# Patient Record
Sex: Female | Born: 1935
Health system: Southern US, Community
[De-identification: ages and names within clinical notes are randomized; demographics above are authoritative.]

## PROBLEM LIST (undated history)

## (undated) ENCOUNTER — Emergency Department (HOSPITAL_COMMUNITY): Payer: Medicare Other

## (undated) DIAGNOSIS — S43006A Unspecified dislocation of unspecified shoulder joint, initial encounter: Secondary | ICD-10-CM

## (undated) DIAGNOSIS — R269 Unspecified abnormalities of gait and mobility: Secondary | ICD-10-CM

## (undated) DIAGNOSIS — E785 Hyperlipidemia, unspecified: Secondary | ICD-10-CM

## (undated) DIAGNOSIS — G629 Polyneuropathy, unspecified: Secondary | ICD-10-CM

## (undated) DIAGNOSIS — F32A Depression, unspecified: Secondary | ICD-10-CM

## (undated) DIAGNOSIS — F419 Anxiety disorder, unspecified: Secondary | ICD-10-CM

## (undated) DIAGNOSIS — F329 Major depressive disorder, single episode, unspecified: Secondary | ICD-10-CM

## (undated) HISTORY — DX: Hyperlipidemia, unspecified: E78.5

## (undated) HISTORY — DX: Unspecified abnormalities of gait and mobility: R26.9

## (undated) HISTORY — DX: Unspecified dislocation of unspecified shoulder joint, initial encounter: S43.006A

## (undated) HISTORY — DX: Anxiety disorder, unspecified: F41.9

## (undated) HISTORY — DX: Polyneuropathy, unspecified: G62.9

## (undated) HISTORY — DX: Major depressive disorder, single episode, unspecified: F32.9

## (undated) HISTORY — DX: Depression, unspecified: F32.A

## (undated) HISTORY — PX: NO PAST SURGERIES: SHX2092

---

## 1998-02-19 ENCOUNTER — Inpatient Hospital Stay (HOSPITAL_COMMUNITY): Admission: EM | Admit: 1998-02-19 | Discharge: 1998-02-20 | Payer: Self-pay | Admitting: Internal Medicine

## 1998-03-05 ENCOUNTER — Encounter: Payer: Self-pay | Admitting: *Deleted

## 1998-03-05 ENCOUNTER — Ambulatory Visit (HOSPITAL_COMMUNITY): Admission: RE | Admit: 1998-03-05 | Discharge: 1998-03-05 | Payer: Self-pay | Admitting: *Deleted

## 1999-03-13 ENCOUNTER — Ambulatory Visit (HOSPITAL_COMMUNITY): Admission: RE | Admit: 1999-03-13 | Discharge: 1999-03-13 | Payer: Self-pay | Admitting: *Deleted

## 1999-03-13 ENCOUNTER — Encounter: Payer: Self-pay | Admitting: *Deleted

## 1999-03-18 ENCOUNTER — Ambulatory Visit (HOSPITAL_COMMUNITY): Admission: RE | Admit: 1999-03-18 | Discharge: 1999-03-18 | Payer: Self-pay | Admitting: *Deleted

## 1999-03-18 ENCOUNTER — Encounter: Payer: Self-pay | Admitting: *Deleted

## 2000-03-19 ENCOUNTER — Ambulatory Visit (HOSPITAL_COMMUNITY): Admission: RE | Admit: 2000-03-19 | Discharge: 2000-03-19 | Payer: Self-pay | Admitting: *Deleted

## 2000-03-19 ENCOUNTER — Encounter: Payer: Self-pay | Admitting: *Deleted

## 2001-03-22 ENCOUNTER — Encounter: Payer: Self-pay | Admitting: *Deleted

## 2001-03-22 ENCOUNTER — Ambulatory Visit (HOSPITAL_COMMUNITY): Admission: RE | Admit: 2001-03-22 | Discharge: 2001-03-22 | Payer: Self-pay | Admitting: *Deleted

## 2002-03-23 ENCOUNTER — Encounter: Payer: Self-pay | Admitting: *Deleted

## 2002-03-23 ENCOUNTER — Ambulatory Visit (HOSPITAL_COMMUNITY): Admission: RE | Admit: 2002-03-23 | Discharge: 2002-03-23 | Payer: Self-pay | Admitting: *Deleted

## 2002-10-02 ENCOUNTER — Ambulatory Visit (HOSPITAL_COMMUNITY): Admission: RE | Admit: 2002-10-02 | Discharge: 2002-10-02 | Payer: Self-pay | Admitting: Gastroenterology

## 2002-10-02 ENCOUNTER — Encounter (INDEPENDENT_AMBULATORY_CARE_PROVIDER_SITE_OTHER): Payer: Self-pay | Admitting: Specialist

## 2003-03-30 ENCOUNTER — Other Ambulatory Visit: Admission: RE | Admit: 2003-03-30 | Discharge: 2003-03-30 | Payer: Self-pay | Admitting: *Deleted

## 2003-04-04 ENCOUNTER — Ambulatory Visit (HOSPITAL_COMMUNITY): Admission: RE | Admit: 2003-04-04 | Discharge: 2003-04-04 | Payer: Self-pay | Admitting: *Deleted

## 2004-05-07 ENCOUNTER — Ambulatory Visit (HOSPITAL_COMMUNITY): Admission: RE | Admit: 2004-05-07 | Discharge: 2004-05-07 | Payer: Self-pay | Admitting: *Deleted

## 2005-05-08 ENCOUNTER — Ambulatory Visit (HOSPITAL_COMMUNITY): Admission: RE | Admit: 2005-05-08 | Discharge: 2005-05-08 | Payer: Self-pay | Admitting: Family Medicine

## 2006-06-16 ENCOUNTER — Ambulatory Visit (HOSPITAL_COMMUNITY): Admission: RE | Admit: 2006-06-16 | Discharge: 2006-06-16 | Payer: Self-pay | Admitting: Family Medicine

## 2007-05-27 ENCOUNTER — Encounter: Admission: RE | Admit: 2007-05-27 | Discharge: 2007-06-14 | Payer: Self-pay | Admitting: Family Medicine

## 2007-08-01 ENCOUNTER — Ambulatory Visit (HOSPITAL_COMMUNITY): Admission: RE | Admit: 2007-08-01 | Discharge: 2007-08-01 | Payer: Self-pay | Admitting: Family Medicine

## 2007-08-14 ENCOUNTER — Emergency Department (HOSPITAL_COMMUNITY): Admission: EM | Admit: 2007-08-14 | Discharge: 2007-08-14 | Payer: Self-pay | Admitting: Emergency Medicine

## 2008-08-10 ENCOUNTER — Ambulatory Visit (HOSPITAL_COMMUNITY): Admission: RE | Admit: 2008-08-10 | Discharge: 2008-08-10 | Payer: Self-pay | Admitting: Family Medicine

## 2008-08-27 ENCOUNTER — Emergency Department (HOSPITAL_BASED_OUTPATIENT_CLINIC_OR_DEPARTMENT_OTHER): Admission: EM | Admit: 2008-08-27 | Discharge: 2008-08-27 | Payer: Self-pay | Admitting: Emergency Medicine

## 2008-08-27 ENCOUNTER — Ambulatory Visit: Payer: Self-pay | Admitting: Diagnostic Radiology

## 2009-09-07 ENCOUNTER — Ambulatory Visit: Payer: Self-pay | Admitting: Diagnostic Radiology

## 2009-09-07 ENCOUNTER — Emergency Department (HOSPITAL_BASED_OUTPATIENT_CLINIC_OR_DEPARTMENT_OTHER): Admission: EM | Admit: 2009-09-07 | Discharge: 2009-09-07 | Payer: Self-pay | Admitting: Emergency Medicine

## 2009-10-11 ENCOUNTER — Ambulatory Visit (HOSPITAL_COMMUNITY): Admission: RE | Admit: 2009-10-11 | Discharge: 2009-10-11 | Payer: Self-pay | Admitting: Family Medicine

## 2010-02-25 ENCOUNTER — Encounter
Admission: RE | Admit: 2010-02-25 | Discharge: 2010-02-25 | Payer: Self-pay | Source: Home / Self Care | Attending: Family Medicine | Admitting: Family Medicine

## 2010-04-03 ENCOUNTER — Other Ambulatory Visit: Payer: Self-pay | Admitting: Gastroenterology

## 2010-04-03 LAB — HM COLONOSCOPY

## 2010-07-11 NOTE — Op Note (Signed)
NAME:  Andrea Martin, Andrea Martin                          ACCOUNT NO.:  [id number]   MEDICAL RECORD NO.:  [id number]                   PATIENT TYPE:  AMB   LOCATION:  ENDO                                 FACILITY:  [facility]   PHYSICIAN:  Petra Kuba, M.D.                 DATE OF BIRTH:  24-Jul-1935   DATE OF PROCEDURE:  10/02/2002  DATE OF DISCHARGE:                                 OPERATIVE REPORT   PROCEDURE:  Colonoscopy.   INDICATIONS FOR PROCEDURE:  The patient due for colonic screening. Consent  was signed after the risks and benefits, methods and options were thoroughly  discussed in the office.   MEDICATIONS USED:  Demerol 75, Versed 7.   DESCRIPTION OF PROCEDURE:  A rectal inspection was pertinent for small  external hemorrhoids. A digital examination was negative. The video  pediatric colonoscope was inserted and with some difficulty due to a  tortuous, long, looping colon, was able to be advanced  to the cecum  __________ transverse. A small polyp was seen and was cold biopsied on  insertion. No other  abnormalities were seen. The cecum was identified by  the appendiceal orifice and the ileocecal valve.   The scope was slowly withdrawn. The prep was adequate. There was some liquid  stool that required washing and suctioning. It was slowly pulled through the  colon and in the more proximal transverse, a small polyp was seen and was  hot biopsied x1. Unfortunately on withdrawing back to the left side of the  colon we did not find the cold biopsy site of the polyp  that we had seen on  insertion.   We went ahead and readvanced to the cecum, which was very difficult, but  again on insertion and withdrawal one more time we could not find a cold  biopsy site. The scope was further  withdrawn. In the proximal level of the  splenic flexure another small polyp was seen and was hot biopsied x1. All  polyps were put in the same container.   The scope was slowly withdrawn through the left  side of the colon. No  additional findings were seen. Anorectal pullthrough and retroflexion  confirmed some small hemorrhoids. The scope was reinserted a short way up  the left side of the colon. Air was suctioned. The scope was removed.   The patient tolerated the procedure well. There were no obvious immediate  complications.   ENDOSCOPIC DIAGNOSES:  1. Internal and external hemorrhoids.  2. Tortuous, long, looping colon.  3. Three transverse polyps, all small, 2 hot biopsies on withdrawal, 1 cold     biopsy on insertion. Questionable one which was cold biopsied completely     removed.  4. Otherwise within normal limits to the cecum.    PLAN:  Await pathology to determine future screening, but may want to  proceed a little sooner based on the one  polyp probably not completely  removed. Otherwise return to the care of Dr. Janey Greaser for the customary  health care management including yearly rectals and Guaiacs. I will be happy  to see back sooner as needed.                                               Petra Kuba, M.D.    MEM/MEDQ  D:  10/02/2002  T:  10/02/2002  Job:  [id number]   cc:   Al Decant. Janey Greaser, MD  [address]  [place]  [place] [address]  Fax: [phone]

## 2010-09-08 ENCOUNTER — Other Ambulatory Visit (HOSPITAL_COMMUNITY): Payer: Self-pay | Admitting: Family Medicine

## 2010-09-08 DIAGNOSIS — Z1231 Encounter for screening mammogram for malignant neoplasm of breast: Secondary | ICD-10-CM

## 2010-10-13 ENCOUNTER — Ambulatory Visit (HOSPITAL_COMMUNITY)
Admission: RE | Admit: 2010-10-13 | Discharge: 2010-10-13 | Disposition: A | Discharge status: Home / Self Care | Payer: Medicare Other | Source: Ambulatory Visit | Attending: Family Medicine | Admitting: Family Medicine

## 2010-10-13 DIAGNOSIS — Z1231 Encounter for screening mammogram for malignant neoplasm of breast: Secondary | ICD-10-CM | POA: Insufficient documentation

## 2011-10-28 ENCOUNTER — Ambulatory Visit: Payer: Medicare Other | Admitting: Internal Medicine

## 2011-11-03 ENCOUNTER — Ambulatory Visit (INDEPENDENT_AMBULATORY_CARE_PROVIDER_SITE_OTHER): Payer: Medicare Other | Admitting: Internal Medicine

## 2011-11-03 ENCOUNTER — Encounter: Payer: Self-pay | Admitting: Internal Medicine

## 2011-11-03 VITALS — BP 138/86 | HR 76 | Temp 97.3°F | Ht 65.25 in | Wt 168.0 lb

## 2011-11-03 DIAGNOSIS — F329 Major depressive disorder, single episode, unspecified: Secondary | ICD-10-CM | POA: Insufficient documentation

## 2011-11-03 DIAGNOSIS — R269 Unspecified abnormalities of gait and mobility: Secondary | ICD-10-CM | POA: Insufficient documentation

## 2011-11-03 DIAGNOSIS — E785 Hyperlipidemia, unspecified: Secondary | ICD-10-CM

## 2011-11-03 DIAGNOSIS — F341 Dysthymic disorder: Secondary | ICD-10-CM

## 2011-11-03 DIAGNOSIS — F32A Depression, unspecified: Secondary | ICD-10-CM

## 2011-11-03 MED ORDER — BUPROPION HCL ER (SR) 150 MG PO TB12: 150.0000 mg | ORAL_TABLET | Freq: Every day | ORAL | Status: DC

## 2011-11-03 MED ORDER — ESCITALOPRAM OXALATE 20 MG PO TABS: 20.0000 mg | ORAL_TABLET | Freq: Every day | ORAL | Status: DC

## 2011-11-03 MED ORDER — SIMVASTATIN 40 MG PO TABS: 40.0000 mg | ORAL_TABLET | Freq: Every evening | ORAL | Status: DC

## 2011-11-03 NOTE — Progress Notes (Signed)
  Subjective:    Patient ID: Andrea Martin, female    DOB: May 21, 1935, 76 y.o.   MRN: [id number]  HPI Here to get established, new patient. Previous PCP Dr. Sigmund Hazel High cholesterol, good medication compliance Anxiety depression, symptoms are currently well controlled with Lexapro Wellbutrin, on looking back, the patient realizes that she has more anxiety than depression. She would like to cut down on her medication and to stop them if possible. She denies any major problems that could trigger  anxiety, she states that she simply worry too much. Had several falls few years ago, no recent falls.  Past medical history Hyperlipidemia History of colon polyps Anxiety, depression History of right shoulder dislocation after a fall  Past surgical history No major   Social history Married x 51 years,  Children x 1, 1 2 G children tobacco--  Never  ETOH-- no  Family history Diabetes-- no CAD-- F, Bx2, brother dx in his 92s Stroke--no Colon cancer--no Breast cancer--no   Review of Systems No chest pain or shortness of breath No nausea, vomiting, diarrhea. Good medication compliance.     Objective:   Physical Exam General -- alert, well-developed Lungs -- normal respiratory effort, no intercostal retractions, no accessory muscle use, and normal breath sounds.   Heart-- normal rate, regular rhythm, no murmur, and no gallop.   Extremities-- no pretibial edema bilaterally  Neurologic-- alert & oriented X3 and strength normal in all extremities. Psych-- Cognition and judgment appear intact. Alert and cooperative with normal attention span and concentration.  not anxious appearing and not depressed appearing.       Assessment & Plan:   Extensive records are reviewed: Last tetanus shot 04-2011 Pneumonia shot 2006 Zostavax 2008 Last pelvic exam by previous PCP 02-2010, I see no report of a Pap smear Last breast exam by previous PCP 04-2011 Last mammogram 09-2010, negative. Was  recommended to have one mammogram every 2 years, I agree History of colonoscopies in 2004, 2007 and 2012. Nexium 2017, GI Dr. Ewing Schlein

## 2011-11-03 NOTE — Assessment & Plan Note (Signed)
Occasional falls, was worse 3 years ago in the last year she has been more steady. Previous PCP work her up with a CT of the head which was reportedly normal. We talk about possibly physical therapy, going to the [facility] or [facility] to get some strength back on her legs. Fall prevention also discussed

## 2011-11-03 NOTE — Assessment & Plan Note (Signed)
Good medication compliance, last cholesterol panel was on 04/2011. Total cholesterol 179, LDL 109. Plan: No change, refill meds

## 2011-11-03 NOTE — Assessment & Plan Note (Addendum)
History of anxiety > depression, well-controlled with current medications. Patient would like to "cut down" her medicines. Plan: Continue Lexapro 20 mg daily Decreased Wellbutrin XR 150 from twice a day to once daily Reassess on RTC

## 2012-02-01 ENCOUNTER — Ambulatory Visit (INDEPENDENT_AMBULATORY_CARE_PROVIDER_SITE_OTHER): Payer: Medicare Other | Admitting: Internal Medicine

## 2012-02-01 ENCOUNTER — Encounter: Payer: Self-pay | Admitting: Internal Medicine

## 2012-02-01 VITALS — BP 140/84 | HR 58 | Temp 98.6°F | Wt 171.0 lb

## 2012-02-01 DIAGNOSIS — F341 Dysthymic disorder: Secondary | ICD-10-CM

## 2012-02-01 DIAGNOSIS — F32A Depression, unspecified: Secondary | ICD-10-CM

## 2012-02-01 DIAGNOSIS — F329 Major depressive disorder, single episode, unspecified: Secondary | ICD-10-CM

## 2012-02-01 DIAGNOSIS — E785 Hyperlipidemia, unspecified: Secondary | ICD-10-CM

## 2012-02-01 LAB — LIPID PANEL
HDL: 50.9 mg/dL (ref >39.00)
Total CHOL/HDL Ratio: 4
VLDL: 26.2 mg/dL (ref 0.0–40.0)

## 2012-02-01 LAB — COMPREHENSIVE METABOLIC PANEL
ALT: 24 U/L (ref 0–35)
Alkaline Phosphatase: 68 U/L (ref 39–117)
CO2: 30 mEq/L (ref 19–32)
Creatinine, Ser: 0.8 mg/dL (ref 0.4–1.2)
Glucose, Bld: 90 mg/dL (ref 70–99)
Potassium: 3.9 mEq/L (ref 3.5–5.1)
Sodium: 140 mEq/L (ref 135–145)

## 2012-02-01 NOTE — Patient Instructions (Addendum)
Lexapro 20 mg: Half tablet daily for 6 weeks, then stop. If you need to go back on Lexapro , please go ahead and do that. Next visit in 4 months for a physical

## 2012-02-01 NOTE — Assessment & Plan Note (Signed)
Due for labs. Continue with medications. Plans to exercise more, recently was told she will be able to use a Silver sneakers program

## 2012-02-01 NOTE — Progress Notes (Signed)
  Subjective:    Patient ID: Andrea Martin, female    DOB: 06/23/1935, 76 y.o.   MRN: [id number]  HPI Routine office visit Since the last time she was here, she decreased the Wellbutrin dose, felt great and eventually completely stopped wellbutrin. Still feeling very well. Good compliance with all meds.  Past Medical History  Diagnosis Date  . Anxiety and depression   . Hyperlipidemia   . Gait disorder   . Shoulder dislocation     h/o after a fall    Past Surgical History  Procedure Date  . No past surgeries      Review of Systems Anxiety and depression remains well controlled. No chest pain or shortness of breath No nausea, vomiting, diarrhea. Had a flu shot.    Objective:   Physical Exam General -- alert, well-developed, and well-nourished.   Lungs -- normal respiratory effort, no intercostal retractions, no accessory muscle use, and normal breath sounds.   Heart-- normal rate, regular rhythm, no murmur, and no gallop.  Extremities-- no pretibial edema bilaterally  Psych-- Cognition and judgment appear intact. Alert and cooperative with normal attention span and concentration.  not anxious appearing and not depressed appearing.      Assessment & Plan:

## 2012-02-01 NOTE — Assessment & Plan Note (Signed)
Since her last visit, she decrease Wellbutrin to 1 tablet daily, a month ago since she was feeling well, she completely discontinue Wellbutrin. She continued to feel well. We discussed possibly is taking Lexapro versus taper off, she likes to taper it off. At this point in her life, she has not much stress. Plan: Decrease Lexapro gradually, see instructions.

## 2012-02-02 ENCOUNTER — Encounter: Payer: Self-pay | Admitting: Internal Medicine

## 2012-03-08 ENCOUNTER — Encounter: Payer: Self-pay | Admitting: Family Medicine

## 2012-03-08 ENCOUNTER — Ambulatory Visit (INDEPENDENT_AMBULATORY_CARE_PROVIDER_SITE_OTHER): Payer: Medicare Other | Admitting: Family Medicine

## 2012-03-08 VITALS — BP 130/74 | HR 73 | Temp 98.0°F | Ht 65.0 in | Wt 171.6 lb

## 2012-03-08 DIAGNOSIS — J329 Chronic sinusitis, unspecified: Secondary | ICD-10-CM

## 2012-03-08 MED ORDER — CLARITHROMYCIN ER 500 MG PO TB24: 1000.0000 mg | ORAL_TABLET | Freq: Every day | ORAL | Status: AC

## 2012-03-08 NOTE — Progress Notes (Signed)
  Subjective:     Andrea Martin is a 77 y.o. female who presents for evaluation of symptoms of a URI. Symptoms include bilateral ear pressure/pain, congestion, facial pain, nasal congestion, purulent nasal discharge and sinus pressure. Onset of symptoms was 10 days ago, and has been gradually worsening since that time. Treatment to date: decongestants.  The following portions of the patient's history were reviewed and updated as appropriate: allergies, current medications, past family history, past medical history, past social history, past surgical history and problem list.  Review of Systems Pertinent items are noted in HPI.   Objective:    BP 130/74  Pulse 73  Temp 98 F (36.7 C) (Oral)  Ht 5\' 5"  (1.651 m)  Wt 171 lb 9.6 oz (77.837 kg)  BMI 28.56 kg/m2  SpO2 97% General appearance: alert, cooperative, appears stated age and no distress Ears: + fluid b/l Nose: green discharge, moderate congestion, turbinates red, swollen, sinus tenderness bilateral Throat: abnormal findings: mild oropharyngeal erythema and pnd Neck: mild anterior cervical adenopathy, supple, symmetrical, trachea midline and thyroid not enlarged, symmetric, no tenderness/mass/nodules Lungs: clear to auscultation bilaterally   Assessment:    sinusitis   Plan:    Discussed the diagnosis and treatment of sinusitis. Suggested symptomatic OTC remedies. Nasal saline spray for congestion. biaxin per orders. Nasal steroids per orders. Follow up as needed. hold simvistatin while on biaxin

## 2012-03-08 NOTE — Patient Instructions (Signed)

## 2012-08-01 ENCOUNTER — Encounter: Payer: Self-pay | Admitting: Internal Medicine

## 2012-08-01 ENCOUNTER — Ambulatory Visit (INDEPENDENT_AMBULATORY_CARE_PROVIDER_SITE_OTHER): Payer: Medicare Other | Admitting: Internal Medicine

## 2012-08-01 VITALS — BP 126/68 | HR 57 | Temp 98.1°F | Wt 164.0 lb

## 2012-08-01 DIAGNOSIS — Z0000 Encounter for general adult medical examination without abnormal findings: Secondary | ICD-10-CM | POA: Insufficient documentation

## 2012-08-01 DIAGNOSIS — F341 Dysthymic disorder: Secondary | ICD-10-CM

## 2012-08-01 DIAGNOSIS — F329 Major depressive disorder, single episode, unspecified: Secondary | ICD-10-CM

## 2012-08-01 DIAGNOSIS — E785 Hyperlipidemia, unspecified: Secondary | ICD-10-CM

## 2012-08-01 DIAGNOSIS — F32A Depression, unspecified: Secondary | ICD-10-CM

## 2012-08-01 LAB — LIPID PANEL
LDL Cholesterol: 102 mg/dL — ABNORMAL HIGH (ref 0–99)
Total CHOL/HDL Ratio: 4
VLDL: 37.6 mg/dL (ref 0.0–40.0)

## 2012-08-01 LAB — CBC WITH DIFFERENTIAL/PLATELET
Eosinophils Relative: 3.1 % (ref 0.0–5.0)
Hemoglobin: 13.1 g/dL (ref 12.0–15.0)
MCHC: 33.6 g/dL (ref 30.0–36.0)
WBC: 4.4 10*3/uL — ABNORMAL LOW (ref 4.5–10.5)

## 2012-08-01 LAB — TSH: TSH: 3.5 u[IU]/mL (ref 0.35–5.50)

## 2012-08-01 NOTE — Patient Instructions (Addendum)

## 2012-08-01 NOTE — Assessment & Plan Note (Addendum)
Last tetanus shot 04-2011  Pneumonia shot 2006  Zostavax 2008  FH of CAD, asx, EKG done today--- nsr Last pelvic exam by previous PCP 02-2010, never had an abnormal PAP, married once, low risk, revisit the issue 2015 (-)  breast exam today Last mammogram 09-2010, negative. Due 09-2012 History of colonoscopies in 2004, 2007 and 2012. Nexium 2017, GI Dr. Ewing Schlein Had a DEXA >> 5 years ago, ordering one ; does very well w/ Ca and vit intake Cont Ca , vit D, increase exercise

## 2012-08-01 NOTE — Progress Notes (Signed)
  Subjective:    Patient ID: Andrea Martin, female    DOB: 12-09-1935, 77 y.o.   MRN: [id number]  HPI Here for Medicare AWV: 1. Risk factors based on Past M, S, F history: reviewed 2. Physical Activities: "not active enough" 3. Depression/mood:  (-) screening  4. Hearing:  No problemss noted or reported  5. ADL's:  Independent  6. Fall Risk: moderate,  h/o falls, see instructions 7. home Safety: does feel safe at home  8. Height, weight, &visual acuity: see VS, sees eye doctor regularly, has cataracts, no surgery yet 9. Counseling: provided 10. Labs ordered based on risk factors: if needed  11. Referral Coordination: if needed 12.  Care Plan, see assessment and plan  13.   Cognitive Assessment: Cognition seems very good for age.  In addition, today we discussed the following: High cholesterol,Good medication compliance. Depression anxiety, doing well on Lexapro. No suicidal ideas.  Past Medical History  Diagnosis Date  . Anxiety and depression   . Hyperlipidemia   . Gait disorder   . Shoulder dislocation     h/o after a fall   Past Surgical History  Procedure Laterality Date  . No past surgeries     History   Social History  . Marital Status: Married    Spouse Name: N/A    Number of Children: 1  . Years of Education: N/A   Occupational History  . retired    Social History Main Topics  . Smoking status: Never Smoker   . Smokeless tobacco: Never Used  . Alcohol Use: No  . Drug Use: No  . Sexually Active: Not on file   Other Topics Concern  . Not on file   Social History Narrative  . No narrative on file   Family History  Problem Relation Age of Onset  . Breast cancer Neg Hx   . Colon cancer Neg Hx   . Diabetes Neg Hx   . Stroke Neg Hx   . Heart disease Father     CAD?  Marland Kitchen Coronary artery disease Brother 79  . Heart disease Mother       Review of Systems No chest pain or shortness or breath No nausea, vomiting, diarrhea or blood in the stools. No  suicidal ideas. No vaginal discharge or bleeding.     Objective:   Physical Exam BP 126/68  Pulse 57  Temp(Src) 98.1 F (36.7 C) (Oral)  Wt 164 lb (74.39 kg)  BMI 27.29 kg/m2  SpO2 95% General -- alert, well-developed, NAD .   Neck --no thyromegaly  Breasts-- No mass, nodules, thickening, tenderness, bulging, retraction, inflamation, nipple discharge or skin changes noted.  no axillary lymph nodes Lungs -- normal respiratory effort, no intercostal retractions, no accessory muscle use, and normal breath sounds.   Heart-- normal rate, regular rhythm, no murmur, and no gallop.   Abdomen--soft, non-tender, no distention, no masses, no HSM, no guarding, and no rigidity.   Extremities-- no pretibial edema bilaterally  Neurologic-- alert & oriented X3 and strength normal in all extremities. Psych-- Cognition and judgment appear intact. Alert and cooperative with normal attention span and concentration.  not anxious appearing and not depressed appearing.        Assessment & Plan:

## 2012-08-01 NOTE — Assessment & Plan Note (Signed)
Since the last time she was here, she tried to decrease Lexapro but didn't feel well, currently symptoms well-controlled. No change

## 2012-08-01 NOTE — Assessment & Plan Note (Signed)
Last cholesterol was slightly higher than before. Plan:  Labs  diet exercise discussed

## 2012-08-03 ENCOUNTER — Encounter: Payer: Self-pay | Admitting: *Deleted

## 2012-08-05 ENCOUNTER — Ambulatory Visit (INDEPENDENT_AMBULATORY_CARE_PROVIDER_SITE_OTHER)
Admission: RE | Admit: 2012-08-05 | Discharge: 2012-08-05 | Disposition: A | Discharge status: Home / Self Care | Payer: Medicare Other | Source: Ambulatory Visit | Attending: Internal Medicine | Admitting: Internal Medicine

## 2012-08-05 DIAGNOSIS — Z13820 Encounter for screening for osteoporosis: Secondary | ICD-10-CM

## 2012-08-05 DIAGNOSIS — Z0000 Encounter for general adult medical examination without abnormal findings: Secondary | ICD-10-CM

## 2012-08-11 ENCOUNTER — Other Ambulatory Visit: Payer: Self-pay | Admitting: Internal Medicine

## 2012-08-11 DIAGNOSIS — Z1231 Encounter for screening mammogram for malignant neoplasm of breast: Secondary | ICD-10-CM

## 2012-09-06 ENCOUNTER — Encounter: Payer: Self-pay | Admitting: Internal Medicine

## 2012-09-06 ENCOUNTER — Other Ambulatory Visit: Payer: Self-pay | Admitting: Internal Medicine

## 2012-09-07 ENCOUNTER — Other Ambulatory Visit: Payer: Self-pay | Admitting: *Deleted

## 2012-09-07 DIAGNOSIS — E785 Hyperlipidemia, unspecified: Secondary | ICD-10-CM

## 2012-09-07 MED ORDER — SIMVASTATIN 40 MG PO TABS: 40.0000 mg | ORAL_TABLET | Freq: Every evening | ORAL | Status: DC

## 2012-09-07 MED ORDER — ESCITALOPRAM OXALATE 20 MG PO TABS: 10.0000 mg | ORAL_TABLET | Freq: Every day | ORAL | Status: DC

## 2012-09-07 NOTE — Telephone Encounter (Signed)
Refill done per protocol and patient request.

## 2012-09-07 NOTE — Telephone Encounter (Signed)
Refill done per protocol.  

## 2012-09-12 ENCOUNTER — Telehealth: Payer: Self-pay | Admitting: Internal Medicine

## 2012-09-13 NOTE — Telephone Encounter (Signed)
Spoke with pt. Pharmacy. Confirmed refill received on 09/07/12 for 3 month supply per protocol. Refill request denied.

## 2012-09-13 NOTE — Telephone Encounter (Signed)
Ok to refill? Last OV 6.9.14 Last filled 7.16.14 #45 no refills (3 month supply) Next OV scheduled for 12.9.14

## 2012-09-16 ENCOUNTER — Encounter: Payer: Self-pay | Admitting: Internal Medicine

## 2012-09-16 DIAGNOSIS — E785 Hyperlipidemia, unspecified: Secondary | ICD-10-CM

## 2012-09-16 MED ORDER — SIMVASTATIN 40 MG PO TABS: 40.0000 mg | ORAL_TABLET | Freq: Every evening | ORAL | Status: DC

## 2012-09-16 NOTE — Telephone Encounter (Signed)
Contacted [facility] they never received rx sent in on 09/07/12 for simvastatin 40mg  #90 with 1 RF. Called in over phone to [facility].

## 2012-10-13 ENCOUNTER — Other Ambulatory Visit: Payer: Self-pay | Admitting: Internal Medicine

## 2012-10-13 ENCOUNTER — Ambulatory Visit (HOSPITAL_COMMUNITY)
Admission: RE | Admit: 2012-10-13 | Discharge: 2012-10-13 | Disposition: A | Discharge status: Home / Self Care | Payer: Medicare Other | Source: Ambulatory Visit | Attending: Internal Medicine | Admitting: Internal Medicine

## 2012-10-13 DIAGNOSIS — Z1231 Encounter for screening mammogram for malignant neoplasm of breast: Secondary | ICD-10-CM | POA: Insufficient documentation

## 2012-12-21 ENCOUNTER — Encounter: Payer: Self-pay | Admitting: Internal Medicine

## 2012-12-26 ENCOUNTER — Other Ambulatory Visit: Payer: Self-pay | Admitting: Internal Medicine

## 2012-12-26 MED ORDER — ESCITALOPRAM OXALATE 20 MG PO TABS: 10.0000 mg | ORAL_TABLET | Freq: Every day | ORAL | Status: DC

## 2012-12-26 NOTE — Telephone Encounter (Signed)
Escitalopram refilled for 90 days, 0 refills.

## 2013-01-31 ENCOUNTER — Encounter: Payer: Self-pay | Admitting: Internal Medicine

## 2013-01-31 ENCOUNTER — Ambulatory Visit (HOSPITAL_BASED_OUTPATIENT_CLINIC_OR_DEPARTMENT_OTHER)
Admission: RE | Admit: 2013-01-31 | Discharge: 2013-01-31 | Disposition: A | Discharge status: Home / Self Care | Payer: Medicare Other | Source: Ambulatory Visit | Attending: Internal Medicine | Admitting: Internal Medicine

## 2013-01-31 ENCOUNTER — Ambulatory Visit (INDEPENDENT_AMBULATORY_CARE_PROVIDER_SITE_OTHER): Payer: Medicare Other | Admitting: Internal Medicine

## 2013-01-31 VITALS — BP 126/68 | HR 63 | Temp 97.9°F | Wt 163.0 lb

## 2013-01-31 DIAGNOSIS — M25579 Pain in unspecified ankle and joints of unspecified foot: Secondary | ICD-10-CM | POA: Insufficient documentation

## 2013-01-31 DIAGNOSIS — S93409A Sprain of unspecified ligament of unspecified ankle, initial encounter: Secondary | ICD-10-CM

## 2013-01-31 DIAGNOSIS — F32A Depression, unspecified: Secondary | ICD-10-CM

## 2013-01-31 DIAGNOSIS — R269 Unspecified abnormalities of gait and mobility: Secondary | ICD-10-CM

## 2013-01-31 DIAGNOSIS — F341 Dysthymic disorder: Secondary | ICD-10-CM

## 2013-01-31 DIAGNOSIS — E785 Hyperlipidemia, unspecified: Secondary | ICD-10-CM

## 2013-01-31 DIAGNOSIS — F329 Major depressive disorder, single episode, unspecified: Secondary | ICD-10-CM

## 2013-01-31 MED ORDER — SIMVASTATIN 40 MG PO TABS: 40.0000 mg | ORAL_TABLET | Freq: Every evening | ORAL | Status: DC

## 2013-01-31 NOTE — Assessment & Plan Note (Signed)
RF meds  

## 2013-01-31 NOTE — Progress Notes (Signed)
   Subjective:    Patient ID: Andrea Martin, female    DOB: 1935-07-29, 77 y.o.   MRN: [id number]  HPI Routine office visit We discussed anxiety- depression, see assessment and plan. She continue with difficulty with her gait, having mechanical  falls, no syncope, no major injuries except for a sprained ankle pain --->  see assessment and plan. High cholesterol-good compliance of medication needs a refill.  Past Medical History  Diagnosis Date  . Anxiety and depression   . Hyperlipidemia   . Gait disorder   . Shoulder dislocation     h/o after a fall   Past Surgical History  Procedure Laterality Date  . No past surgeries     History   Social History  . Marital Status: Married    Spouse Name: N/A    Number of Children: 1  . Years of Education: N/A   Occupational History  . retired    Social History Main Topics  . Smoking status: Never Smoker   . Smokeless tobacco: Never Used  . Alcohol Use: No  . Drug Use: No  . Sexual Activity: Not on file   Other Topics Concern  . Not on file   Social History Narrative  . No narrative on file   Review of Systems Denies chest pain or shortness or breath No  nausea, vomiting, diarrhea. Currently no anxiety, depression, insomnia. Feeling well emotionally. Occasionally feels very cold, CBC and TSH normal, recommend observation, vascular exam normal today    Objective:   Physical Exam  BP 126/68  Pulse 63  Temp(Src) 97.9 F (36.6 C)  Wt 163 lb (73.936 kg)  SpO2 98% General -- alert, well-developed, NAD.   Extremities-- no pretibial edema bilaterally ; Right internal malleolus slightly puffy, ankle  mobility normal. Good capillary refill throughout, normal pedal pulses. Neurologic--  alert & oriented X3. Speech normal, gait normal for age, strength symmetric in all extremities.  Psych-- Cognition and judgment appear intact. Cooperative with normal attention span and concentration. No anxious appearing , no depressed  appearing.      Assessment & Plan:

## 2013-01-31 NOTE — Assessment & Plan Note (Addendum)
She had 2 or 3 more falls in the last year, I recommended physical therapy, she needs to get her core muscles stronger. She agrees. We'll arrange. Also, she twisted her right ankle and has some remaining swelling at the inner aspect, no pain. Will get a x-ray to be complete

## 2013-01-31 NOTE — Progress Notes (Signed)
Pre visit review using our clinic review tool, if applicable. No additional management support is needed unless otherwise documented below in the visit note. 

## 2013-01-31 NOTE — Assessment & Plan Note (Addendum)
She tried to derease Lexapro, did not feel well, overwhelmed with daily tasks, "I was not nice to my family" . She is back on her regular Lexapro 20 mg and feels great, sleeps well She does not have any specific triggers for anxiety and depression, she has a happy marriage life and  has been taking SSRIs for many years. At this point we agreed to cont a Lexapro 20 mg for now. I   see no reason to stop Since she does not have any side effects

## 2013-01-31 NOTE — Patient Instructions (Signed)
Get the XR at [facility], corner of [address] (10 minutes form here); they are open 24/7 [address]  [place], [place] [address] [phone]    Next visit for a physical exam  fasting, in 6 months  Please make an appointment

## 2013-02-06 ENCOUNTER — Ambulatory Visit: Payer: Medicare Other | Attending: Internal Medicine

## 2013-02-06 DIAGNOSIS — IMO0001 Reserved for inherently not codable concepts without codable children: Secondary | ICD-10-CM | POA: Insufficient documentation

## 2013-02-06 DIAGNOSIS — R262 Difficulty in walking, not elsewhere classified: Secondary | ICD-10-CM | POA: Insufficient documentation

## 2013-02-06 DIAGNOSIS — M6281 Muscle weakness (generalized): Secondary | ICD-10-CM | POA: Insufficient documentation

## 2013-02-08 ENCOUNTER — Ambulatory Visit: Payer: Medicare Other | Admitting: Physical Therapy

## 2013-02-10 ENCOUNTER — Telehealth: Payer: Self-pay | Admitting: Internal Medicine

## 2013-02-10 ENCOUNTER — Other Ambulatory Visit: Payer: Self-pay | Admitting: *Deleted

## 2013-02-10 MED ORDER — ESCITALOPRAM OXALATE 20 MG PO TABS: 10.0000 mg | ORAL_TABLET | Freq: Every day | ORAL | Status: DC

## 2013-02-10 NOTE — Telephone Encounter (Signed)
Patient needs her escitalopram (LEXAPRO) 20 MG refilled and sent to [facility]. Please advise.

## 2013-02-10 NOTE — Telephone Encounter (Signed)
Done. DJR  

## 2013-02-10 NOTE — Telephone Encounter (Signed)
rx refilled per protocol. DJR  

## 2013-02-14 ENCOUNTER — Ambulatory Visit: Payer: Medicare Other

## 2013-02-20 ENCOUNTER — Other Ambulatory Visit: Payer: Self-pay | Admitting: Internal Medicine

## 2013-02-20 ENCOUNTER — Other Ambulatory Visit: Payer: Self-pay | Admitting: *Deleted

## 2013-02-20 ENCOUNTER — Telehealth: Payer: Self-pay | Admitting: *Deleted

## 2013-02-20 MED ORDER — ESCITALOPRAM OXALATE 20 MG PO TABS: 20.0000 mg | ORAL_TABLET | Freq: Every day | ORAL | Status: DC

## 2013-02-20 NOTE — Telephone Encounter (Signed)
90 day supply of Lexapro e-scribed to [facility] and 15 day supply to [facility]. JG//CMA

## 2013-02-20 NOTE — Telephone Encounter (Signed)
02/20/2013  Pt came by office.  She had appt 01/31/2013.  During that appt, the pt and Paz agreed she should take 1 tablet of escitalopram (LEXAPRO) 20 MG tablet daily.  The refill that was done 02/10/2013 was sent in [facility] for 0.5 tablet daily instead of a whole tablet like they discussed.  She started taking the whole tablet before her appt on 01/31/2013 with the prescription she already had.   [facility] will not fill the prescription early due to prescription still stating for her to take 0.5 tablet daily, and will not sent until after January 5.  She has aprox 3 pills left.  She is requesting a prescription for 15 days that she can take to a local pharmacy to get her through until [facility] sends her refill.  Please send new prescription to [facility] for 1 tablet a day instead of 0.5 a day.   Please send prescription for local pharmacy to [facility] on [address], [place].  Please advise patient when this is taken care of.  Thank you.  bw

## 2013-02-21 ENCOUNTER — Ambulatory Visit: Payer: Medicare Other

## 2013-02-24 ENCOUNTER — Ambulatory Visit: Payer: Medicare Other | Admitting: Physical Therapy

## 2013-02-27 ENCOUNTER — Ambulatory Visit: Payer: Medicare Other | Admitting: Physical Therapy

## 2013-03-09 ENCOUNTER — Telehealth: Payer: Self-pay | Admitting: *Deleted

## 2013-03-09 NOTE — Telephone Encounter (Signed)
Message copied by Baldwin JamaicaJOHNSON, Deaaron Fulghum G on Thu Mar 09, 2013  2:36 PM ------      Message from: Noralyn PickMCCALLUM, CHARITY B      Created: Thu Mar 09, 2013 10:38 AM      Contact: [phone]       Patient called and stated that she wanted to talk to somebody over the clinical staff about prescriptions.   ------

## 2013-03-09 NOTE — Telephone Encounter (Signed)
Attempted to call the patient back, left message on voice to return my call.

## 2013-03-10 ENCOUNTER — Other Ambulatory Visit: Payer: Self-pay | Admitting: *Deleted

## 2013-03-10 DIAGNOSIS — E785 Hyperlipidemia, unspecified: Secondary | ICD-10-CM

## 2013-03-10 MED ORDER — SIMVASTATIN 40 MG PO TABS: 40.0000 mg | ORAL_TABLET | Freq: Every evening | ORAL | Status: DC

## 2013-03-10 MED ORDER — ESCITALOPRAM OXALATE 20 MG PO TABS: 20.0000 mg | ORAL_TABLET | Freq: Every day | ORAL | Status: DC

## 2013-03-10 NOTE — Telephone Encounter (Signed)
Refills for lexapro and simvastatin sent to [facility] per pt request.

## 2013-05-09 ENCOUNTER — Ambulatory Visit (INDEPENDENT_AMBULATORY_CARE_PROVIDER_SITE_OTHER): Payer: Medicare Other | Admitting: Internal Medicine

## 2013-05-09 ENCOUNTER — Encounter: Payer: Self-pay | Admitting: Internal Medicine

## 2013-05-09 VITALS — BP 146/76 | HR 63 | Temp 97.9°F | Wt 169.0 lb

## 2013-05-09 DIAGNOSIS — J029 Acute pharyngitis, unspecified: Secondary | ICD-10-CM

## 2013-05-09 LAB — POCT RAPID STREP A (OFFICE): Rapid Strep A Screen: NEGATIVE

## 2013-05-09 NOTE — Patient Instructions (Signed)
Rest, fluids , tylenol If  cough, take Mucinex DM twice a day as needed  For congestion use OTC Nasocort: 2 nasal sprays on each side of the nose daily until you feel better Neti- pot ? Call if no better in few days Call anytime if the symptoms are severe

## 2013-05-09 NOTE — Progress Notes (Signed)
Pre visit review using our clinic review tool, if applicable. No additional management support is needed unless otherwise documented below in the visit note. 

## 2013-05-09 NOTE — Progress Notes (Signed)
   Subjective:    Patient ID: Andrea Martin, female    DOB: 04/29/1935, 78 y.o.   MRN: [id number]  DOS:  05/09/2013 Type of  visit: Acute visit  Started with sore throat on and off last week, symptoms increased for the last 2 days: Throat looks red,it feels burning and raw. Taking allergy medications as needed--->  not helping her ST   ROS Denies fever chills Mild sinus congestion with clear nasal discharge. Mild cough, and denies any chest congestion. No heartburn.   Past Medical History  Diagnosis Date  . Anxiety and depression   . Hyperlipidemia   . Gait disorder   . Shoulder dislocation     h/o after a fall    Past Surgical History  Procedure Laterality Date  . No past surgeries      History   Social History  . Marital Status: Married    Spouse Name: N/A    Number of Children: 1  . Years of Education: N/A   Occupational History  . retired    Social History Main Topics  . Smoking status: Never Smoker   . Smokeless tobacco: Never Used  . Alcohol Use: No  . Drug Use: No  . Sexual Activity: Not on file   Other Topics Concern  . Not on file   Social History Narrative  . No narrative on file        Medication List       This list is accurate as of: 05/09/13 11:59 PM.  Always use your most recent med list.               aspirin 81 MG tablet  Take 81 mg by mouth daily.     calcium carbonate 600 MG Tabs tablet  Commonly known as:  OS-CAL  Take 600 mg by mouth daily.     escitalopram 20 MG tablet  Commonly known as:  LEXAPRO  Take 1 tablet (20 mg total) by mouth daily.     fish oil-omega-3 fatty acids 1000 MG capsule  Take 1 g by mouth daily.     glucosamine-chondroitin 500-400 MG tablet  Take 1 tablet by mouth daily.     multivitamin tablet  Take 1 tablet by mouth daily.     simvastatin 40 MG tablet  Commonly known as:  ZOCOR  Take 1 tablet (40 mg total) by mouth every evening.     VITAMIN B 12 PO  Take 1 tablet by mouth daily.           Objective:   Physical Exam BP 146/76  Pulse 63  Temp(Src) 97.9 F (36.6 C)  Wt 169 lb (76.658 kg)  SpO2 98% General -- alert, well-developed, NAD.  HEENT-- Not pale. TMs L normal, R obscured by wax; Throat symmetric, mild  Redness, no  discharge.  Face symmetric, sinuses not tender to palpation. Nose moderately congested. Lungs -- normal respiratory effort, no intercostal retractions, no accessory muscle use, and normal breath sounds.  Heart-- normal rate, regular rhythm, no murmur.   Neurologic--  alert & oriented X3. Speech normal, gait normal, strength normal in all extremities.  Psych-- Cognition and judgment appear intact. Cooperative with normal attention span and concentration. No anxious or depressed appearing.      Assessment & Plan:   Pharyngitis Strep test negative, likely viral pharyngitis. See instructions

## 2013-08-02 ENCOUNTER — Telehealth: Payer: Self-pay | Admitting: *Deleted

## 2013-08-02 NOTE — Telephone Encounter (Addendum)
Medication List and Allergies: Reviewed and updated  90 Day supply/Mail order: [facility] Local pharmacy: [facility] [address]  Immunizations Due: Prevnar   A/P FH/PSH or Personal History: Reviewed and updated Flu Vaccine: 11/2012 Pna: 01/2010 Tdap: DTap 04/24/2011 and Td 04/27/2011 Shingles: 04/2009 CCS: 03/2010 Pap: 02/2010, normal MMG: 07/2012 normal BD: 07/2012 normal  To discuss with provider:  Not at this time

## 2013-08-04 ENCOUNTER — Ambulatory Visit (INDEPENDENT_AMBULATORY_CARE_PROVIDER_SITE_OTHER): Payer: Medicare Other | Admitting: Internal Medicine

## 2013-08-04 ENCOUNTER — Encounter: Payer: Self-pay | Admitting: Internal Medicine

## 2013-08-04 VITALS — BP 130/70 | HR 62 | Temp 97.9°F | Ht 64.5 in | Wt 165.0 lb

## 2013-08-04 DIAGNOSIS — E785 Hyperlipidemia, unspecified: Secondary | ICD-10-CM

## 2013-08-04 DIAGNOSIS — G589 Mononeuropathy, unspecified: Secondary | ICD-10-CM

## 2013-08-04 DIAGNOSIS — G629 Polyneuropathy, unspecified: Secondary | ICD-10-CM

## 2013-08-04 DIAGNOSIS — F32A Depression, unspecified: Secondary | ICD-10-CM

## 2013-08-04 DIAGNOSIS — F341 Dysthymic disorder: Secondary | ICD-10-CM

## 2013-08-04 DIAGNOSIS — F329 Major depressive disorder, single episode, unspecified: Secondary | ICD-10-CM

## 2013-08-04 DIAGNOSIS — F419 Anxiety disorder, unspecified: Secondary | ICD-10-CM

## 2013-08-04 DIAGNOSIS — Z23 Encounter for immunization: Secondary | ICD-10-CM

## 2013-08-04 DIAGNOSIS — Z0000 Encounter for general adult medical examination without abnormal findings: Secondary | ICD-10-CM

## 2013-08-04 DIAGNOSIS — R209 Unspecified disturbances of skin sensation: Secondary | ICD-10-CM

## 2013-08-04 HISTORY — DX: Polyneuropathy, unspecified: G62.9

## 2013-08-04 LAB — LIPID PANEL
CHOL/HDL RATIO: 4
Cholesterol: 172 mg/dL (ref 0–200)
HDL: 48.4 mg/dL (ref >39.00)
LDL CALC: 101 mg/dL — AB (ref 0–99)
NONHDL: 123.6
Triglycerides: 111 mg/dL (ref 0.0–149.0)
VLDL: 22.2 mg/dL (ref 0.0–40.0)

## 2013-08-04 LAB — TSH: TSH: 2.49 u[IU]/mL (ref 0.35–4.50)

## 2013-08-04 LAB — COMPREHENSIVE METABOLIC PANEL
ALBUMIN: 3.7 g/dL (ref 3.5–5.2)
ALK PHOS: 54 U/L (ref 39–117)
ALT: 19 U/L (ref 0–35)
AST: 23 U/L (ref 0–37)
BUN: 20 mg/dL (ref 6–23)
CALCIUM: 9.7 mg/dL (ref 8.4–10.5)
CHLORIDE: 109 meq/L (ref 96–112)
CO2: 27 mEq/L (ref 19–32)
Creatinine, Ser: 0.7 mg/dL (ref 0.4–1.2)
GFR: 90.85 mL/min (ref >60.00)
Glucose, Bld: 94 mg/dL (ref 70–99)
POTASSIUM: 5 meq/L (ref 3.5–5.1)
SODIUM: 143 meq/L (ref 135–145)
TOTAL PROTEIN: 6.7 g/dL (ref 6.0–8.3)
Total Bilirubin: 0.5 mg/dL (ref 0.2–1.2)

## 2013-08-04 LAB — VITAMIN B12: VITAMIN B 12: 813 pg/mL (ref 211–911)

## 2013-08-04 LAB — FOLATE

## 2013-08-04 NOTE — Progress Notes (Signed)
Subjective:    Patient ID: Andrea Martin, female    DOB: 02/01/1936, 78 y.o.   MRN: [id number]  DOS:  08/04/2013 Type of  Visit:    Here for Medicare AWV:  1. Risk factors based on Past M, S, F history: reviewed  2. Physical Activities:  No routine exercise   3. Depression/mood: (-) screening  4. Hearing: No problemss noted or reported  5. ADL's: Independent , still drives  6. Fall Risk: moderate, h/o falls, see instructions  7. home Safety: does feel safe at home  8. Height, weight, &visual acuity: see VS, sees eye doctor regularly, has cataracts, no surgery yet, vision still ok 9. Counseling: provided  10. Labs ordered based on risk factors: if needed  11. Referral Coordination: if needed  12. Care Plan, see assessment and plan  13. Cognitive Assessment: Cognition seems very good for age.   In addition, today we discussed the following Anxiety, depression, symptoms well-controlled with Lexapro Hyperlipidemia, on statins, denies unusual myalgias or arthralgias. Good compliance with meds. Also complaining of feet numbness, mostly at night, not a painful sensation. She has a tendency to shake her feet a lot but not necessarily to decrease the numbness. Denies any major problems with backache  ROS Denies chest pain, difficulty breathing or lower extremity edema Denies nausea, vomiting, diarrhea or blood in the stools. No cough, sputum production. Denies any vaginal discharge or bleeding  Past Medical History  Diagnosis Date  . Anxiety and depression   . Hyperlipidemia   . Gait disorder   . Shoulder dislocation     h/o after a fall    Past Surgical History  Procedure Laterality Date  . No past surgeries      History   Social History  . Marital Status: Married    Spouse Name: N/A    Number of Children: 1  . Years of Education: N/A   Occupational History  . retired    Social History Main Topics  . Smoking status: Never Smoker   . Smokeless tobacco: Never Used    . Alcohol Use: No  . Drug Use: No  . Sexual Activity: Not on file   Other Topics Concern  . Not on file   Social History Narrative   Lives w/ husband , share the house w/ daughter      Family History  Problem Relation Age of Onset  . Breast cancer Neg Hx   . Colon cancer Neg Hx   . Diabetes Neg Hx   . Stroke Neg Hx   . Heart disease Father     CAD?  Marland Kitchen. Coronary artery disease Brother [id number]  . Heart disease Mother        Medication List       This list is accurate as of: 08/04/13 11:08 AM.  Always use your most recent med list.               aspirin 81 MG tablet  Take 81 mg by mouth daily.     calcium carbonate 600 MG Tabs tablet  Commonly known as:  OS-CAL  Take 600 mg by mouth daily.     escitalopram 20 MG tablet  Commonly known as:  LEXAPRO  Take 1 tablet (20 mg total) by mouth daily.     fish oil-omega-3 fatty acids 1000 MG capsule  Take 1 g by mouth daily.     glucosamine-chondroitin 500-400 MG tablet  Take 1 tablet by mouth daily.  multivitamin tablet  Take 1 tablet by mouth daily.     simvastatin 40 MG tablet  Commonly known as:  ZOCOR  Take 1 tablet (40 mg total) by mouth every evening.     VITAMIN B 12 PO  Take 1 tablet by mouth daily.           Objective:   Physical Exam BP 130/70  Pulse 62  Temp(Src) 97.9 F (36.6 C)  Ht 5' 4.5" (1.638 m)  Wt 165 lb (74.844 kg)  BMI 27.90 kg/m2  SpO2 96%  General -- alert, well-developed, NAD.  Neck --no thyromegaly , normal carotid pulse  HEENT-- Not pale.  Lungs -- normal respiratory effort, no intercostal retractions, no accessory muscle use, and normal breath sounds.  Heart-- normal rate, regular rhythm, no murmur.  Abdomen-- Not distended, good bowel sounds,soft, non-tender. Extremities-- no pretibial edema bilaterally ; normal pedal pulses  Neurologic--  alert & oriented X3. Speech normal, gait appropriate for age, strength symmetric and appropriate for age.  DTRs symmetric.pinprick  examination w/ mild patchy decrease in sensitivity at distal feet Psych-- Cognition and judgment appear intact. Cooperative with normal attention span and concentration. No anxious or depressed appearing.       Assessment & Plan:

## 2013-08-04 NOTE — Assessment & Plan Note (Signed)
Td 2013  Pneumonia shot 2011 prevnar today Zostavax 2008   No further PAP, pt is low risk, she is in agreement (-)  breast exam2014  Last mammogram  07-2012 (-)  History of colonoscopies in 2004, 2007 and 2012. Next 2017 per pt , GI Dr. Ewing SchleinMagod   DEXA 07-2012 (-), rec  Ca and vit intake Diet-exercise discussed

## 2013-08-04 NOTE — Assessment & Plan Note (Signed)
Chart and pertinent labs reviewed  Good compliance of medication, labs

## 2013-08-04 NOTE — Progress Notes (Signed)
Pre visit review using our clinic review tool, if applicable. No additional management support is needed unless otherwise documented below in the visit note. 

## 2013-08-04 NOTE — Patient Instructions (Signed)
Get your blood work before you leave     Next visit is for routine check up regards neuropathy in 6 months  No need to come back fasting Please make an appointment      Fall Prevention and Home Safety Falls cause injuries and can affect all age groups. It is possible to use preventive measures to significantly decrease the likelihood of falls. There are many simple measures which can make your home safer and prevent falls. OUTDOORS  Repair cracks and edges of walkways and driveways.  Remove high doorway thresholds.  Trim shrubbery on the main path into your home.  Have good outside lighting.  Clear walkways of tools, rocks, debris, and clutter.  Check that handrails are not broken and are securely fastened. Both sides of steps should have handrails.  Have leaves, snow, and ice cleared regularly.  Use sand or salt on walkways during winter months.  In the garage, clean up grease or oil spills. BATHROOM  Install night lights.  Install grab bars by the toilet and in the tub and shower.  Use non-skid mats or decals in the tub or shower.  Place a plastic non-slip stool in the shower to sit on, if needed.  Keep floors dry and clean up all water on the floor immediately.  Remove soap buildup in the tub or shower on a regular basis.  Secure bath mats with non-slip, double-sided rug tape.  Remove throw rugs and tripping hazards from the floors. BEDROOMS  Install night lights.  Make sure a bedside light is easy to reach.  Do not use oversized bedding.  Keep a telephone by your bedside.  Have a firm chair with side arms to use for getting dressed.  Remove throw rugs and tripping hazards from the floor. KITCHEN  Keep handles on pots and pans turned toward the center of the stove. Use back burners when possible.  Clean up spills quickly and allow time for drying.  Avoid walking on wet floors.  Avoid hot utensils and knives.  Position shelves so they are not  too high or low.  Place commonly used objects within easy reach.  If necessary, use a sturdy step stool with a grab bar when reaching.  Keep electrical cables out of the way.  Do not use floor polish or wax that makes floors slippery. If you must use wax, use non-skid floor wax.  Remove throw rugs and tripping hazards from the floor. STAIRWAYS  Never leave objects on stairs.  Place handrails on both sides of stairways and use them. Fix any loose handrails. Make sure handrails on both sides of the stairways are as long as the stairs.  Check carpeting to make sure it is firmly attached along stairs. Make repairs to worn or loose carpet promptly.  Avoid placing throw rugs at the top or bottom of stairways, or properly secure the rug with carpet tape to prevent slippage. Get rid of throw rugs, if possible.  Have an electrician put in a light switch at the top and bottom of the stairs. OTHER FALL PREVENTION TIPS  Wear low-heel or rubber-soled shoes that are supportive and fit well. Wear closed toe shoes.  When using a stepladder, make sure it is fully opened and both spreaders are firmly locked. Do not climb a closed stepladder.  Add color or contrast paint or tape to grab bars and handrails in your home. Place contrasting color strips on first and last steps.  Learn and use mobility aids as needed.  Install an electrical emergency response system.  Turn on lights to avoid dark areas. Replace light bulbs that burn out immediately. Get light switches that glow.  Arrange furniture to create clear pathways. Keep furniture in the same place.  Firmly attach carpet with non-skid or double-sided tape.  Eliminate uneven floor surfaces.  Select a carpet pattern that does not visually hide the edge of steps.  Be aware of all pets. OTHER HOME SAFETY TIPS  Set the water temperature for 120 F (48.8 C).  Keep emergency numbers on or near the telephone.  Keep smoke detectors on every  level of the home and near sleeping areas. Document Released: 01/30/2002 Document Revised: 08/11/2011 Document Reviewed: 05/01/2011 [facility] Patient Information 2014 [employer] [place].

## 2013-08-04 NOTE — Assessment & Plan Note (Signed)
New issue. Symptoms consistent with neuropathy, decreased pinprick examination, see physical exam. No back pain. Plan:  Labs, observation for now, if she gets worse will consider medication.

## 2013-08-04 NOTE — Assessment & Plan Note (Signed)
Symptoms well-controlled with leaxpro without apparent side effects. Refill as needed

## 2013-12-08 ENCOUNTER — Ambulatory Visit (HOSPITAL_BASED_OUTPATIENT_CLINIC_OR_DEPARTMENT_OTHER)
Admission: RE | Admit: 2013-12-08 | Discharge: 2013-12-08 | Disposition: A | Discharge status: Home / Self Care | Payer: Medicare Other | Source: Ambulatory Visit | Attending: Internal Medicine | Admitting: Internal Medicine

## 2013-12-08 ENCOUNTER — Encounter: Payer: Self-pay | Admitting: Internal Medicine

## 2013-12-08 ENCOUNTER — Ambulatory Visit (INDEPENDENT_AMBULATORY_CARE_PROVIDER_SITE_OTHER): Payer: Medicare Other | Admitting: Internal Medicine

## 2013-12-08 VITALS — BP 138/68 | HR 58 | Temp 98.8°F | Wt 165.0 lb

## 2013-12-08 DIAGNOSIS — Z23 Encounter for immunization: Secondary | ICD-10-CM

## 2013-12-08 DIAGNOSIS — M25562 Pain in left knee: Secondary | ICD-10-CM

## 2013-12-08 DIAGNOSIS — R600 Localized edema: Secondary | ICD-10-CM

## 2013-12-08 DIAGNOSIS — M79662 Pain in left lower leg: Secondary | ICD-10-CM | POA: Insufficient documentation

## 2013-12-08 NOTE — Progress Notes (Signed)
Subjective:    Patient ID: Andrea Martin, female    DOB: 05/23/1935, 78 y.o.   MRN: [id number]  DOS:  12/08/2013 Type of visit - description : acute Interval history: Symptoms started 2 weeks ago, worse for one week: Left knee pain and swelling, pain and swelling also involving the popliteal area and proximal calf. Occasionally the  knee is warm but not red. Ibuprofen helps. The more active she is the worst the pain is at the end of the day  ROS Denies fever or chills No CP-SOB No recent airplane or prolonged car trip  Past Medical History  Diagnosis Date  . Anxiety and depression   . Hyperlipidemia   . Gait disorder   . Shoulder dislocation     h/o after a fall    Past Surgical History  Procedure Laterality Date  . No past surgeries      History   Social History  . Marital Status: Married    Spouse Name: N/A    Number of Children: 1  . Years of Education: N/A   Occupational History  . retired    Social History Main Topics  . Smoking status: Never Smoker   . Smokeless tobacco: Never Used  . Alcohol Use: No  . Drug Use: No  . Sexual Activity: Not on file   Other Topics Concern  . Not on file   Social History Narrative   Lives w/ husband , share the house w/ daughter         Medication List       This list is accurate as of: 12/08/13 11:59 PM.  Always use your most recent med list.               aspirin 81 MG tablet  Take 81 mg by mouth daily.     calcium carbonate 600 MG Tabs tablet  Commonly known as:  OS-CAL  Take 600 mg by mouth daily.     escitalopram 20 MG tablet  Commonly known as:  LEXAPRO  Take 1 tablet (20 mg total) by mouth daily.     fish oil-omega-3 fatty acids 1000 MG capsule  Take 1 g by mouth daily.     glucosamine-chondroitin 500-400 MG tablet  Take 1 tablet by mouth daily.     multivitamin tablet  Take 1 tablet by mouth daily.     simvastatin 40 MG tablet  Commonly known as:  ZOCOR  Take 1 tablet (40 mg total)  by mouth every evening.     VITAMIN B 12 PO  Take 1 tablet by mouth daily.           Objective:   Physical Exam BP 138/68  Pulse 58  Temp(Src) 98.8 F (37.1 C) (Oral)  Wt 165 lb (74.844 kg)  SpO2 98%  General -- alert, well-developed, NAD.    Extremities--  R leg wnl L leg: Knee were read a small to moderate effusion, range of motion is normal, no crepitus, no redness, mildly warm on the medial aspect. Left calf is measured, circumference is 1 cm larger than the other one. Minimal pain when I squeeze the calf. Walks with a very mild limp Neurologic--  alert & oriented X3. Speech normal  Psych-- Cognition and judgment appear intact. Cooperative with normal attention span and concentration. No anxious or depressed appearing.       Assessment & Plan:   Knee pain, left leg edema,  I believe the primary issue is within the  knee, I am slightly concerned about the swelling. DDX includes DJD, meniscal tear, Baker's cysts, DVT, doubt she had a septic joint. Plan:   ultrasound, ibuprofen, ice, refer to ortho

## 2013-12-08 NOTE — Patient Instructions (Signed)
Get the ultrasound today  Get a knee brace take ibuprofen OTC sporadically ICE to the knee at the end of the day

## 2013-12-08 NOTE — Progress Notes (Signed)
Pre visit review using our clinic review tool, if applicable. No additional management support is needed unless otherwise documented below in the visit note. 

## 2014-02-05 ENCOUNTER — Encounter: Payer: Self-pay | Admitting: Internal Medicine

## 2014-02-05 ENCOUNTER — Ambulatory Visit (INDEPENDENT_AMBULATORY_CARE_PROVIDER_SITE_OTHER): Payer: Medicare Other | Admitting: Internal Medicine

## 2014-02-05 VITALS — BP 138/68 | HR 61 | Temp 97.7°F | Wt 164.4 lb

## 2014-02-05 DIAGNOSIS — Z79899 Other long term (current) drug therapy: Secondary | ICD-10-CM

## 2014-02-05 DIAGNOSIS — G629 Polyneuropathy, unspecified: Secondary | ICD-10-CM

## 2014-02-05 DIAGNOSIS — I10 Essential (primary) hypertension: Secondary | ICD-10-CM

## 2014-02-05 DIAGNOSIS — F418 Other specified anxiety disorders: Secondary | ICD-10-CM

## 2014-02-05 DIAGNOSIS — F32A Depression, unspecified: Secondary | ICD-10-CM

## 2014-02-05 DIAGNOSIS — F329 Major depressive disorder, single episode, unspecified: Secondary | ICD-10-CM

## 2014-02-05 DIAGNOSIS — F419 Anxiety disorder, unspecified: Secondary | ICD-10-CM

## 2014-02-05 LAB — HEMOGLOBIN A1C: Hgb A1c MFr Bld: 5.6 % (ref 4.6–6.5)

## 2014-02-05 LAB — SEDIMENTATION RATE: Sed Rate: 16 mm/hr (ref 0–22)

## 2014-02-05 MED ORDER — BUPROPION HCL ER (SR) 150 MG PO TB12: 150.0000 mg | ORAL_TABLET | Freq: Every day | ORAL | Status: DC

## 2014-02-05 MED ORDER — ESCITALOPRAM OXALATE 20 MG PO TABS: 20.0000 mg | ORAL_TABLET | Freq: Every day | ORAL | Status: DC

## 2014-02-05 MED ORDER — SIMVASTATIN 40 MG PO TABS: 40.0000 mg | ORAL_TABLET | Freq: Every evening | ORAL | Status: DC

## 2014-02-05 NOTE — Patient Instructions (Addendum)
Get your blood work before you leave   Restart Wellbutrin XL 150 mg: The first 3 weeks, take half tablet daily  Come back to the office in 3  months.

## 2014-02-05 NOTE — Progress Notes (Signed)
Pre visit review using our clinic review tool, if applicable. No additional management support is needed unless otherwise documented below in the visit note. 

## 2014-02-05 NOTE — Assessment & Plan Note (Signed)
Symptoms consistent with peripheral neuropathy, to confirm the diagnosis we'll get a nerve conduction study. Labs reviewed, normal recent vitamin B12 and folic acid, no anemia. For completeness we will get a A1c, sedimentation rate, ANA and RPR.

## 2014-02-05 NOTE — Progress Notes (Signed)
Subjective:    Patient ID: Andrea Martin, female    DOB: 11/10/1935, 78 y.o.   MRN: [id number]  DOS:  02/05/2014 Type of visit - description : f/u Interval history: Here for a follow-up regards symptoms consistent with neuropathy, they are about the same. Again she has numbness at the toes bilaterally, worse on the right, mostly at night. Is not really affecting her or causing pain.  ROS Denies back pain, bladder or bowel incontinence. No recent weight loss, does feel cold all the time. Was seen recently with knee pain, symptoms resolved.  Past Medical History  Diagnosis Date  . Anxiety and depression   . Hyperlipidemia   . Gait disorder   . Shoulder dislocation     h/o after a fall    Past Surgical History  Procedure Laterality Date  . No past surgeries      History   Social History  . Marital Status: Married    Spouse Name: N/A    Number of Children: 1  . Years of Education: N/A   Occupational History  . retired    Social History Main Topics  . Smoking status: Never Smoker   . Smokeless tobacco: Never Used  . Alcohol Use: No  . Drug Use: No  . Sexual Activity: Not on file   Other Topics Concern  . Not on file   Social History Narrative   Lives w/ husband , share the house w/ daughter         Medication List       This list is accurate as of: 02/05/14  5:47 PM.  Always use your most recent med list.               ALEVE 220 MG tablet  Generic drug:  naproxen sodium  Take 220 mg by mouth daily.     aspirin 81 MG tablet  Take 81 mg by mouth daily.     buPROPion 150 MG 12 hr tablet  Commonly known as:  WELLBUTRIN SR  Take 1 tablet (150 mg total) by mouth daily.     calcium carbonate 600 MG Tabs tablet  Commonly known as:  OS-CAL  Take 600 mg by mouth daily.     escitalopram 20 MG tablet  Commonly known as:  LEXAPRO  Take 1 tablet (20 mg total) by mouth daily.     fish oil-omega-3 fatty acids 1000 MG capsule  Take 1 g by mouth daily.       glucosamine-chondroitin 500-400 MG tablet  Take 1 tablet by mouth daily.     multivitamin tablet  Take 1 tablet by mouth daily.     simvastatin 40 MG tablet  Commonly known as:  ZOCOR  Take 1 tablet (40 mg total) by mouth every evening.     VITAMIN B 12 PO  Take 1 tablet by mouth daily.     Vitamin D-3 1000 UNITS Caps  Take 1 capsule by mouth daily.           Objective:   Physical Exam BP 138/68 mmHg  Pulse 61  Temp(Src) 97.7 F (36.5 C) (Oral)  Wt 164 lb 6 oz (74.56 kg)  SpO2 97%  General -- alert, well-developed, NAD.   Extremities-- no pretibial edema bilaterally . Good pedal pulses bilaterally Neurologic--  alert & oriented X3. Speech normal, gait appropriate for age, strength symmetric and appropriate for age.  DTRs symmetric. Pinprick examination of the feet normal   Psych-- Cognition and judgment  appear intact. Cooperative with normal attention span and concentration. No anxious or depressed appearing.        Assessment & Plan:  Knee pain, lower extremity edema Ultrasound was positive for a Baker's cyst, saw Dr. Juliene PinaGeoffrey, had a local injection, now essentially asymptomatic.   Today , I spent more than  25  min with the patient: >50% of the time counseling regards depression, discuss counseling and pro/cons of adding Wellbutrin.

## 2014-02-05 NOTE — Assessment & Plan Note (Signed)
At the end of the visit she told me Lexapro 20 mg is not doing as good work for her as she would like, denies suicidal ideas. We talk about possible counseling or add Wellbutrin. In the past Wellbutrin did a good job for her. Plan: Restart Wellbutrin XR 150 mg, the first 3 weeks take half tablet. Come back in 3 months.

## 2014-02-06 ENCOUNTER — Other Ambulatory Visit: Payer: Self-pay

## 2014-02-06 ENCOUNTER — Telehealth: Payer: Self-pay | Admitting: Internal Medicine

## 2014-02-06 LAB — ANA: Anti Nuclear Antibody(ANA): NEGATIVE

## 2014-02-06 LAB — RPR

## 2014-02-06 MED ORDER — BUPROPION HCL ER (SR) 150 MG PO TB12: 150.0000 mg | ORAL_TABLET | Freq: Every day | ORAL | Status: DC

## 2014-02-06 NOTE — Telephone Encounter (Signed)
Caller name:Menefee Talbert ForestShirley Relation to ZO:XWRUpt:self Call back number:[phone] Pharmacy:[facility]  Reason for call: pt was yesterday and dr. Drue NovelPaz had sent her rx buPROPion [facility](WELLBUTRIN SR) 150t o the mail order, pt would like to know if she can get a 30 day supply [facility] to [facility] , pt states she was thinking when she told dr. Drue NovelPaz he didn't need to sent to a local pharmacy but she need the meds.

## 2014-02-06 NOTE — Telephone Encounter (Signed)
30 day supply sent to [facility] as requested.

## 2014-05-07 ENCOUNTER — Encounter: Payer: Self-pay | Admitting: Internal Medicine

## 2014-05-07 ENCOUNTER — Ambulatory Visit (INDEPENDENT_AMBULATORY_CARE_PROVIDER_SITE_OTHER): Payer: Medicare Other | Admitting: Internal Medicine

## 2014-05-07 VITALS — BP 128/74 | HR 59 | Temp 98.2°F | Ht 65.0 in | Wt 166.2 lb

## 2014-05-07 DIAGNOSIS — F419 Anxiety disorder, unspecified: Secondary | ICD-10-CM

## 2014-05-07 DIAGNOSIS — F32A Depression, unspecified: Secondary | ICD-10-CM

## 2014-05-07 DIAGNOSIS — G629 Polyneuropathy, unspecified: Secondary | ICD-10-CM

## 2014-05-07 DIAGNOSIS — F418 Other specified anxiety disorders: Secondary | ICD-10-CM | POA: Diagnosis not present

## 2014-05-07 DIAGNOSIS — F329 Major depressive disorder, single episode, unspecified: Secondary | ICD-10-CM

## 2014-05-07 MED ORDER — BUPROPION HCL ER (SR) 150 MG PO TB12: 150.0000 mg | ORAL_TABLET | Freq: Every day | ORAL | Status: DC

## 2014-05-07 NOTE — Assessment & Plan Note (Signed)
Wellbutrin was added to Lexapro, great response without apparent side effects, refill medications. No change

## 2014-05-07 NOTE — Assessment & Plan Note (Signed)
Decided not to proceed with a nerve conduction study, currently symptoms are at baseline and not bothersome to the patient.

## 2014-05-07 NOTE — Progress Notes (Signed)
Pre visit review using our clinic review tool, if applicable. No additional management support is needed unless otherwise documented below in the visit note. 

## 2014-05-07 NOTE — Patient Instructions (Signed)
  Come back to the office  In 3 months   for a physical exam  Please schedule an appointment at the front desk    Come back fasting

## 2014-05-07 NOTE — Progress Notes (Signed)
   Subjective:    Patient ID: Andrea Martin, female    DOB: 01/23/1936, 79 y.o.   MRN: [id number]  DOS:  05/07/2014 Type of visit - description : f/u previous visit Interval history: Started Wellbutrin, great response, no apparent side effects, request a refill Neuropathy, decided against the nerve conduction study, symptoms are currently stable and not very bothersome to the patient   Review of Systems Denies any suicidal ideas  Past Medical History  Diagnosis Date  . Anxiety and depression   . Hyperlipidemia   . Gait disorder   . Shoulder dislocation     h/o after a fall  . Neuropathy 08/04/2013    Past Surgical History  Procedure Laterality Date  . No past surgeries      History   Social History  . Marital Status: Married    Spouse Name: N/A  . Number of Children: 1  . Years of Education: N/A   Occupational History  . retired    Social History Main Topics  . Smoking status: Never Smoker   . Smokeless tobacco: Never Used  . Alcohol Use: No  . Drug Use: No  . Sexual Activity: Not on file   Other Topics Concern  . Not on file   Social History Narrative   Lives w/ husband , share the house w/ daughter         Medication List       This list is accurate as of: 05/07/14  6:08 PM.  Always use your most recent med list.               ALEVE 220 MG tablet  Generic drug:  naproxen sodium  Take 220 mg by mouth daily.     aspirin 81 MG tablet  Take 81 mg by mouth daily.     buPROPion 150 MG 12 hr tablet  Commonly known as:  WELLBUTRIN SR  Take 1 tablet (150 mg total) by mouth daily.     calcium carbonate 600 MG Tabs tablet  Commonly known as:  OS-CAL  Take 600 mg by mouth daily.     escitalopram 20 MG tablet  Commonly known as:  LEXAPRO  Take 1 tablet (20 mg total) by mouth daily.     fish oil-omega-3 fatty acids 1000 MG capsule  Take 1 g by mouth daily.     glucosamine-chondroitin 500-400 MG tablet  Take 1 tablet by mouth daily.     multivitamin tablet  Take 1 tablet by mouth daily.     simvastatin 40 MG tablet  Commonly known as:  ZOCOR  Take 1 tablet (40 mg total) by mouth every evening.     VITAMIN B 12 PO  Take 1 tablet by mouth daily.     Vitamin D-3 1000 UNITS Caps  Take 1 capsule by mouth daily.           Objective:   Physical Exam BP 128/74 mmHg  Pulse 59  Temp(Src) 98.2 F (36.8 C) (Oral)  Ht 5\' 5"  (1.651 m)  Wt 166 lb 4 oz (75.411 kg)  BMI 27.67 kg/m2  SpO2 97%  General -- alert, well-developed, NAD.   Neurologic--  alert & oriented X3. Speech normal, gait appropriate for age, strength symmetric and appropriate for age.   Psych-- Cognition and judgment appear intact. Cooperative with normal attention span and concentration. No anxious or depressed appearing.         Assessment & Plan:

## 2014-09-10 ENCOUNTER — Telehealth: Payer: Self-pay | Admitting: Internal Medicine

## 2014-09-10 NOTE — Telephone Encounter (Signed)
pre visit letter mailed 09/05/14 °

## 2014-09-25 ENCOUNTER — Telehealth: Payer: Self-pay | Admitting: Behavioral Health

## 2014-09-25 NOTE — Telephone Encounter (Signed)
Unable to reach patient at time of Pre-Visit Call.  Left message for patient to return call when available.    

## 2014-09-26 ENCOUNTER — Encounter: Payer: Self-pay | Admitting: Internal Medicine

## 2014-09-26 ENCOUNTER — Ambulatory Visit (INDEPENDENT_AMBULATORY_CARE_PROVIDER_SITE_OTHER): Payer: Medicare Other | Admitting: Internal Medicine

## 2014-09-26 ENCOUNTER — Telehealth: Payer: Self-pay | Admitting: Internal Medicine

## 2014-09-26 VITALS — BP 124/64 | HR 57 | Temp 98.1°F | Ht 65.0 in | Wt 168.0 lb

## 2014-09-26 DIAGNOSIS — G629 Polyneuropathy, unspecified: Secondary | ICD-10-CM | POA: Diagnosis not present

## 2014-09-26 DIAGNOSIS — F32A Depression, unspecified: Secondary | ICD-10-CM

## 2014-09-26 DIAGNOSIS — E785 Hyperlipidemia, unspecified: Secondary | ICD-10-CM | POA: Diagnosis not present

## 2014-09-26 DIAGNOSIS — F329 Major depressive disorder, single episode, unspecified: Secondary | ICD-10-CM

## 2014-09-26 DIAGNOSIS — F419 Anxiety disorder, unspecified: Secondary | ICD-10-CM

## 2014-09-26 DIAGNOSIS — Z0000 Encounter for general adult medical examination without abnormal findings: Secondary | ICD-10-CM

## 2014-09-26 DIAGNOSIS — Z1239 Encounter for other screening for malignant neoplasm of breast: Secondary | ICD-10-CM

## 2014-09-26 NOTE — Assessment & Plan Note (Signed)
Controlled with current medications, no change

## 2014-09-26 NOTE — Telephone Encounter (Signed)
That is fine, I have changed labs to future labs.

## 2014-09-26 NOTE — Progress Notes (Signed)
Pre visit review using our clinic review tool, if applicable. No additional management support is needed unless otherwise documented below in the visit note. 

## 2014-09-26 NOTE — Progress Notes (Signed)
Subjective:    Patient ID: Andrea Martin, female    DOB: 1935-06-12, 79 y.o.   MRN: [id number]  DOS:  09/26/2014 Type of visit - description :  Here for Medicare AWV:  1. Risk factors based on Past M, S, F history: reviewed 2. Physical Activities:  Not very active 3. Depression/mood: neg screening , on meds, controlled 4. Hearing:  No problems noted or reported  5. ADL's: independent, drives  6. Fall Risk: no recent falls, prevention discussed , see AVS 7. home Safety: does feel safe at home  8. Height, weight, & visual acuity: see VS, sees eye doctor regularly, has cataracts  9. Counseling: provided 10. Labs ordered based on risk factors: if needed  11. Referral Coordination: if needed 12. Care Plan, see assessment and plan , written personalized plan provided , see AVS 13. Cognitive Assessment: motor skills and cognition appropriate for age 87. Care team updated, ophtalmology 15. End-of-life care discussed , has a H-POA  In addition, today we discussed the following: In general feeling great. High cholesterol, good compliance of medication. Good compliance with calcium and vitamin D    Review of Systems Constitutional: No fever. No chills. No unexplained wt changes. No unusual sweats  HEENT: No dental problems, no ear discharge, no facial swelling, no voice changes. No eye discharge, no eye  redness , no  intolerance to light   Respiratory: No wheezing , no  difficulty breathing. No cough , no mucus production  Cardiovascular: No CP, no leg swelling , no  Palpitations  GI: no nausea, no vomiting, no diarrhea , no  abdominal pain.  No blood in the stools. No dysphagia, no odynophagia    Endocrine: No polyphagia, no polyuria , no polydipsia  GU: No dysuria, gross hematuria, difficulty urinating. No urinary urgency, no frequency.  Musculoskeletal: No joint swellings or unusual aches or pains  Skin: No change in the color of the skin, palor , no  Rash  Allergic,  immunologic: No environmental allergies , no  food allergies  Neurological: No dizziness no  syncope. No headaches. No diplopia, no slurred, no slurred speech, no motor deficits, no facial  Numbness  Hematological: No enlarged lymph nodes, no easy bruising , no unusual bleedings  Psychiatry: No suicidal ideas, no hallucinations, no beavior problems, no confusion.  No unusual/severe anxiety, no depression   Past Medical History  Diagnosis Date  . Anxiety and depression   . Hyperlipidemia   . Gait disorder   . Shoulder dislocation     h/o after a fall  . Neuropathy 08/04/2013    Past Surgical History  Procedure Laterality Date  . No past surgeries      History   Social History  . Marital Status: Married    Spouse Name: N/A  . Number of Children: 1  . Years of Education: N/A   Occupational History  . retired    Social History Main Topics  . Smoking status: Never Smoker   . Smokeless tobacco: Never Used  . Alcohol Use: No  . Drug Use: No  . Sexual Activity: Not on file   Other Topics Concern  . Not on file   Social History Narrative   Lives w/ husband , share the house w/ daughter      Family History  Problem Relation Age of Onset  . Breast cancer Neg Hx   . Colon cancer Neg Hx   . Diabetes Neg Hx   . Stroke Neg Hx   .  Heart disease Father     CAD?  Marland Kitchen Coronary artery disease Brother 71  . Heart disease Mother        Medication List       This list is accurate as of: 09/26/14 11:59 PM.  Always use your most recent med list.               ALEVE 220 MG tablet  Generic drug:  naproxen sodium  Take 220 mg by mouth daily.     aspirin 81 MG tablet  Take 81 mg by mouth daily.     buPROPion 150 MG 12 hr tablet  Commonly known as:  WELLBUTRIN SR  Take 1 tablet (150 mg total) by mouth daily.     calcium carbonate 600 MG Tabs tablet  Commonly known as:  OS-CAL  Take 600 mg by mouth daily.     escitalopram 20 MG tablet  Commonly known as:  LEXAPRO    Take 1 tablet (20 mg total) by mouth daily.     fish oil-omega-3 fatty acids 1000 MG capsule  Take 1 g by mouth daily.     glucosamine-chondroitin 500-400 MG tablet  Take 1 tablet by mouth daily.     multivitamin tablet  Take 1 tablet by mouth daily.     simvastatin 40 MG tablet  Commonly known as:  ZOCOR  Take 1 tablet (40 mg total) by mouth every evening.     VITAMIN B 12 PO  Take 1 tablet by mouth daily.     Vitamin D-3 1000 UNITS Caps  Take 1 capsule by mouth daily.           Objective:   Physical Exam BP 124/64 mmHg  Pulse 57  Temp(Src) 98.1 F (36.7 C) (Oral)  Ht  (1.651 m)  Wt 168 lb (76.204 kg)  BMI 27.96 kg/m2  SpO2 96% General:   Well developed, well nourished . NAD.  HEENT:  Normocephalic . Face symmetric, atraumatic Neck: No thyromegaly, normal carotid pulses Breast exam: No dominant mass, skin and nipple normal, no axillary LAD Lungs:  CTA B Normal respiratory effort, no intercostal retractions, no accessory muscle use. Heart: RRR,  no murmur.  no pretibial edema bilaterally  Abdomen:  Not distended, soft, non-tender. No rebound or rigidity. No mass,organomegaly Skin: Not pale. Not jaundice Neurologic:  alert & oriented X3.  Speech normal, gait appropriate for age and unassisted Psych--  Cognition and judgment appear intact.  Cooperative with normal attention span and concentration.  Behavior appropriate. No anxious or depressed appearing.    Assessment & Plan:

## 2014-09-26 NOTE — Patient Instructions (Signed)
Get your blood work before you leave      Fall Prevention and Home Safety Falls cause injuries and can affect all age groups. It is possible to use preventive measures to significantly decrease the likelihood of falls. There are many simple measures which can make your home safer and prevent falls. OUTDOORS  Repair cracks and edges of walkways and driveways.  Remove high doorway thresholds.  Trim shrubbery on the main path into your home.  Have good outside lighting.  Clear walkways of tools, rocks, debris, and clutter.  Check that handrails are not broken and are securely fastened. Both sides of steps should have handrails.  Have leaves, snow, and ice cleared regularly.  Use sand or salt on walkways during winter months.  In the garage, clean up grease or oil spills. BATHROOM  Install night lights.  Install grab bars by the toilet and in the tub and shower.  Use non-skid mats or decals in the tub or shower.  Place a plastic non-slip stool in the shower to sit on, if needed.  Keep floors dry and clean up all water on the floor immediately.  Remove soap buildup in the tub or shower on a regular basis.  Secure bath mats with non-slip, double-sided rug tape.  Remove throw rugs and tripping hazards from the floors. BEDROOMS  Install night lights.  Make sure a bedside light is easy to reach.  Do not use oversized bedding.  Keep a telephone by your bedside.  Have a firm chair with side arms to use for getting dressed.  Remove throw rugs and tripping hazards from the floor. KITCHEN  Keep handles on pots and pans turned toward the center of the stove. Use back burners when possible.  Clean up spills quickly and allow time for drying.  Avoid walking on wet floors.  Avoid hot utensils and knives.  Position shelves so they are not too high or low.  Place commonly used objects within easy reach.  If necessary, use a sturdy step stool with a grab bar when  reaching.  Keep electrical cables out of the way.  Do not use floor polish or wax that makes floors slippery. If you must use wax, use non-skid floor wax.  Remove throw rugs and tripping hazards from the floor. STAIRWAYS  Never leave objects on stairs.  Place handrails on both sides of stairways and use them. Fix any loose handrails. Make sure handrails on both sides of the stairways are as long as the stairs.  Check carpeting to make sure it is firmly attached along stairs. Make repairs to worn or loose carpet promptly.  Avoid placing throw rugs at the top or bottom of stairways, or properly secure the rug with carpet tape to prevent slippage. Get rid of throw rugs, if possible.  Have an electrician put in a light switch at the top and bottom of the stairs. OTHER FALL PREVENTION TIPS  Wear low-heel or rubber-soled shoes that are supportive and fit well. Wear closed toe shoes.  When using a stepladder, make sure it is fully opened and both spreaders are firmly locked. Do not climb a closed stepladder.  Add color or contrast paint or tape to grab bars and handrails in your home. Place contrasting color strips on first and last steps.  Learn and use mobility aids as needed. Install an electrical emergency response system.  Turn on lights to avoid dark areas. Replace light bulbs that burn out immediately. Get light switches that glow.    Arrange furniture to create clear pathways. Keep furniture in the same place.  Firmly attach carpet with non-skid or double-sided tape.  Eliminate uneven floor surfaces.  Select a carpet pattern that does not visually hide the edge of steps.  Be aware of all pets. OTHER HOME SAFETY TIPS  Set the water temperature for 120 F (48.8 C).  Keep emergency numbers on or near the telephone.  Keep smoke detectors on every level of the home and near sleeping areas. Document Released: 01/30/2002 Document Revised: 08/11/2011 Document Reviewed:  05/01/2011 ExitCare Patient Information 2015 [employer]. This information is not intended to replace advice given to you by your health care provider. Make sure you discuss any questions you have with your health care provider.   Preventive Care for Adults Ages 65 and over  Blood pressure check.** / Every 1 to 2 years.  Lipid and cholesterol check.**/ Every 5 years beginning at age 20.  Lung cancer screening. / Every year if you are aged 55-80 years and have a 30-pack-year history of smoking and currently smoke or have quit within the past 15 years. Yearly screening is stopped once you have quit smoking for at least 15 years or develop a health problem that would prevent you from having lung cancer treatment.  Fecal occult blood test (FOBT) of stool. / Every year beginning at age 50 and continuing until age 75. You may not have to do this test if you get a colonoscopy every 10 years.  Flexible sigmoidoscopy** or colonoscopy.** / Every 5 years for a flexible sigmoidoscopy or every 10 years for a colonoscopy beginning at age 50 and continuing until age 75.  Hepatitis C blood test.** / For all people born from 1945 through 1965 and any individual with known risks for hepatitis C.  Abdominal aortic aneurysm (AAA) screening.** / A one-time screening for ages 65 to 75 years who are current or former smokers.  Skin self-exam. / Monthly.  Influenza vaccine. / Every year.  Tetanus, diphtheria, and acellular pertussis (Tdap/Td) vaccine.** / 1 dose of Td every 10 years.  Varicella vaccine.** / Consult your health care provider.  Zoster vaccine.** / 1 dose for adults aged 60 years or older.  Pneumococcal 13-valent conjugate (PCV13) vaccine.** / Consult your health care provider.  Pneumococcal polysaccharide (PPSV23) vaccine.** / 1 dose for all adults aged 65 years and older.  Meningococcal vaccine.** / Consult your health care provider.  Hepatitis A vaccine.** / Consult your health care  provider.  Hepatitis B vaccine.** / Consult your health care provider.  Haemophilus influenzae type b (Hib) vaccine.** / Consult your health care provider. **Family history and personal history of risk and conditions may change your health care provider's recommendations. Document Released: 04/07/2001 Document Revised: 02/14/2013 Document Reviewed: 07/07/2010 ExitCare Patient Information 2015 [employer]. This information is not intended to replace advice given to you by your health care provider. Make sure you discuss any questions you have with your health care provider.   

## 2014-09-26 NOTE — Assessment & Plan Note (Addendum)
Continue with zocor, check labs

## 2014-09-26 NOTE — Assessment & Plan Note (Addendum)
Td 2013  Pneumonia shot 2011 prevnar 2015 Zostavax 2008   Female Care No further PAP, pt is low risk, she is in agreement  breast exam today wnl Rx a MMG  History of colonoscopies in 2004, 2007 and 2012. Next 2017 per pt , GI Dr. Ewing Schlein   DEXA 07-2012 (-), rec  Ca and vit intake Diet-exercise discussed

## 2014-09-26 NOTE — Telephone Encounter (Signed)
Relation to pt: self  Call back number: [phone]   Reason for call:  Patient left the office without having her labs done patient will schedule one day this week for fasting labs only. Is this ok?

## 2014-09-27 LAB — COMPREHENSIVE METABOLIC PANEL
ALK PHOS: 63 U/L (ref 39–117)
ALT: 20 U/L (ref 0–35)
AST: 22 U/L (ref 0–37)
Albumin: 3.8 g/dL (ref 3.5–5.2)
BILIRUBIN TOTAL: 0.4 mg/dL (ref 0.2–1.2)
BUN: 15 mg/dL (ref 6–23)
CHLORIDE: 108 meq/L (ref 96–112)
CO2: 29 mEq/L (ref 19–32)
Calcium: 9.3 mg/dL (ref 8.4–10.5)
Creatinine, Ser: 0.83 mg/dL (ref 0.40–1.20)
GFR: 70.74 mL/min (ref >60.00)
Glucose, Bld: 97 mg/dL (ref 70–99)
POTASSIUM: 4.1 meq/L (ref 3.5–5.1)
SODIUM: 141 meq/L (ref 135–145)
Total Protein: 6.5 g/dL (ref 6.0–8.3)

## 2014-09-27 LAB — CBC WITH DIFFERENTIAL/PLATELET
Basophils Absolute: 0 10*3/uL (ref 0.0–0.1)
Basophils Relative: 0.4 % (ref 0.0–3.0)
EOS PCT: 2.3 % (ref 0.0–5.0)
Eosinophils Absolute: 0.1 10*3/uL (ref 0.0–0.7)
HEMATOCRIT: 40 % (ref 36.0–46.0)
HEMOGLOBIN: 13.5 g/dL (ref 12.0–15.0)
LYMPHS ABS: 1.6 10*3/uL (ref 0.7–4.0)
Lymphocytes Relative: 39.1 % (ref 12.0–46.0)
MCHC: 33.7 g/dL (ref 30.0–36.0)
MCV: 94.5 fl (ref 78.0–100.0)
MONOS PCT: 8.4 % (ref 3.0–12.0)
Monocytes Absolute: 0.4 10*3/uL (ref 0.1–1.0)
NEUTROS ABS: 2.1 10*3/uL (ref 1.4–7.7)
Neutrophils Relative %: 49.8 % (ref 43.0–77.0)
Platelets: 240 10*3/uL (ref 150.0–400.0)
RBC: 4.23 Mil/uL (ref 3.87–5.11)
RDW: 13.4 % (ref 11.5–15.5)
WBC: 4.2 10*3/uL (ref 4.0–10.5)

## 2014-09-27 LAB — LIPID PANEL
Cholesterol: 191 mg/dL (ref 0–200)
HDL: 49.9 mg/dL (ref >39.00)
LDL Cholesterol: 116 mg/dL — ABNORMAL HIGH (ref 0–99)
NonHDL: 140.61
Total CHOL/HDL Ratio: 4
Triglycerides: 125 mg/dL (ref 0.0–149.0)
VLDL: 25 mg/dL (ref 0.0–40.0)

## 2014-10-11 ENCOUNTER — Ambulatory Visit
Admission: RE | Admit: 2014-10-11 | Discharge: 2014-10-11 | Disposition: A | Discharge status: Home / Self Care | Payer: Medicare Other | Source: Ambulatory Visit | Attending: Internal Medicine | Admitting: Internal Medicine

## 2014-10-11 DIAGNOSIS — Z1231 Encounter for screening mammogram for malignant neoplasm of breast: Secondary | ICD-10-CM | POA: Diagnosis not present

## 2015-01-04 ENCOUNTER — Other Ambulatory Visit: Payer: Self-pay | Admitting: Internal Medicine

## 2015-03-05 DIAGNOSIS — H5203 Hypermetropia, bilateral: Secondary | ICD-10-CM | POA: Diagnosis not present

## 2015-03-05 DIAGNOSIS — H1789 Other corneal scars and opacities: Secondary | ICD-10-CM | POA: Diagnosis not present

## 2015-03-05 DIAGNOSIS — H2513 Age-related nuclear cataract, bilateral: Secondary | ICD-10-CM | POA: Diagnosis not present

## 2015-04-04 ENCOUNTER — Telehealth: Payer: Self-pay | Admitting: Internal Medicine

## 2015-04-04 NOTE — Telephone Encounter (Signed)
Pharmacies updated.

## 2015-04-04 NOTE — Telephone Encounter (Signed)
Caller name: [place] Relation to pt: self Call back number: [phone] Pharmacy:[facility] [address] [facility]  Reason for call: Pt came in office stating would like to change her pharmacy from [facility] on [address] to:  [facility] on [address], but pt also states still using [facility] for her on going rx. Please update.

## 2015-05-28 ENCOUNTER — Ambulatory Visit: Payer: Self-pay | Admitting: Internal Medicine

## 2015-06-04 ENCOUNTER — Ambulatory Visit (INDEPENDENT_AMBULATORY_CARE_PROVIDER_SITE_OTHER): Payer: Medicare Other | Admitting: Internal Medicine

## 2015-06-04 ENCOUNTER — Encounter: Payer: Self-pay | Admitting: Internal Medicine

## 2015-06-04 VITALS — BP 118/74 | HR 55 | Temp 98.2°F | Ht 65.0 in | Wt 169.5 lb

## 2015-06-04 DIAGNOSIS — F32A Depression, unspecified: Secondary | ICD-10-CM

## 2015-06-04 DIAGNOSIS — Z09 Encounter for follow-up examination after completed treatment for conditions other than malignant neoplasm: Secondary | ICD-10-CM | POA: Insufficient documentation

## 2015-06-04 DIAGNOSIS — F329 Major depressive disorder, single episode, unspecified: Secondary | ICD-10-CM

## 2015-06-04 DIAGNOSIS — F418 Other specified anxiety disorders: Secondary | ICD-10-CM | POA: Diagnosis not present

## 2015-06-04 DIAGNOSIS — G629 Polyneuropathy, unspecified: Secondary | ICD-10-CM

## 2015-06-04 DIAGNOSIS — E785 Hyperlipidemia, unspecified: Secondary | ICD-10-CM

## 2015-06-04 DIAGNOSIS — F419 Anxiety disorder, unspecified: Secondary | ICD-10-CM

## 2015-06-04 DIAGNOSIS — R269 Unspecified abnormalities of gait and mobility: Secondary | ICD-10-CM

## 2015-06-04 NOTE — Progress Notes (Signed)
Subjective:    Patient ID: Andrea Martin, female    DOB: 06/14/1935, 80 y.o.   MRN: [id number]  DOS:  06/04/2015 Type of visit - description : Routine office visit Interval history: Anxiety- depression: Good compliance of medication, felt a slightly down after Christmas but is back to normal Somewhat concerned about family history of dementia, she has no symptoms Letter from GI, due for a colonoscopy, likes to know my opinion. Concerned about her lack of exercise, needs advise Previous labs reviewed, not due for any testing at this point   Review of Systems   Past Medical History  Diagnosis Date  . Anxiety and depression   . Hyperlipidemia   . Gait disorder   . Shoulder dislocation     h/o after a fall  . Neuropathy (HCC) 08/04/2013    Past Surgical History  Procedure Laterality Date  . No past surgeries      Social History   Social History  . Marital Status: Married    Spouse Name: N/A  . Number of Children: 1  . Years of Education: N/A   Occupational History  . retired    Social History Main Topics  . Smoking status: Never Smoker   . Smokeless tobacco: Never Used  . Alcohol Use: No  . Drug Use: No  . Sexual Activity: Not on file   Other Topics Concern  . Not on file   Social History Narrative   Lives w/ husband , share the house w/ daughter         Medication List       This list is accurate as of: 06/04/15  5:20 PM.  Always use your most recent med list.               ALEVE 220 MG tablet  Generic drug:  naproxen sodium  Take 220 mg by mouth daily.     aspirin 81 MG tablet  Take 81 mg by mouth daily.     buPROPion 150 MG 12 hr tablet  Commonly known as:  WELLBUTRIN SR  Take 1 tablet (150 mg total) by mouth daily.     calcium carbonate 600 MG Tabs tablet  Commonly known as:  OS-CAL  Take 600 mg by mouth daily.     escitalopram 20 MG tablet  Commonly known as:  LEXAPRO  Take 1 tablet (20 mg total) by mouth daily.     fish  oil-omega-3 fatty acids 1000 MG capsule  Take 1 g by mouth daily.     glucosamine-chondroitin 500-400 MG tablet  Take 1 tablet by mouth daily.     multivitamin tablet  Take 1 tablet by mouth daily.     simvastatin 40 MG tablet  Commonly known as:  ZOCOR  Take 1 tablet (40 mg total) by mouth every evening.     VITAMIN B 12 PO  Take 1 tablet by mouth daily.     Vitamin D-3 1000 units Caps  Take 1 capsule by mouth daily.           Objective:   Physical Exam BP 118/74 mmHg  Pulse 55  Temp(Src) 98.2 F (36.8 C) (Oral)  Ht 5\' 5"  (1.651 m)  Wt 169 lb 8 oz (76.885 kg)  BMI 28.21 kg/m2  SpO2 96% General:   Well developed, well nourished . NAD.  HEENT:  Normocephalic . Face symmetric, atraumatic Lungs:  CTA B Normal respiratory effort, no intercostal retractions, no accessory muscle use. Heart: RRR,  no murmur.  No pretibial edema bilaterally  Skin: Not pale. Not jaundice Neurologic:  alert & oriented X3.  Speech normal, gait appropriate for age and unassisted Psych--  Cognition and judgment appear intact.  Cooperative with normal attention span and concentration.  Behavior appropriate. No anxious or depressed appearing.      Assessment & Plan:   Assessment   Hyperlipidemia Neuropathy: DX 2015, declined NCS Anxiety depression Gait d/o, falls, referred to PT 2014  Plan: Hyperlipidemia: Well-controlled, continue simvastatin Anxiety depression: Currently well controlled, on Lexapro / Wellbutrin Neuropathy, gait disorder: Here lately she has no issues. Primary care: Colonoscopy? She has several colonoscopies before, history of polyps, she is 68 and in good health. I think is appropriate to proceed with a colonoscopy. Benefits of exercise extensively discussed RTC 4 months, CPX   Today, I spent more than  17  min with the patient: >50% of the time counseling regards The benefits of exercise, appropriateness of a colonoscopy after I review her chart.

## 2015-06-04 NOTE — Assessment & Plan Note (Signed)
Hyperlipidemia: Well-controlled, continue simvastatin Anxiety depression: Currently well controlled, on Lexapro / Wellbutrin Neuropathy, gait disorder: Here lately she has no issues. Primary care: Colonoscopy? She has several colonoscopies before, history of polyps, she is [age] and in good health. I think is appropriate to proceed with a colonoscopy. Benefits of exercise extensively discussed RTC 4 months, CPX

## 2015-06-04 NOTE — Patient Instructions (Signed)
  GO TO THE FRONT DESK Schedule your next appointment for a  Physical exam in 4 months, fasting

## 2015-06-04 NOTE — Progress Notes (Signed)
Pre visit review using our clinic review tool, if applicable. No additional management support is needed unless otherwise documented below in the visit note. 

## 2015-10-18 ENCOUNTER — Ambulatory Visit (INDEPENDENT_AMBULATORY_CARE_PROVIDER_SITE_OTHER): Payer: Medicare Other | Admitting: Internal Medicine

## 2015-10-18 ENCOUNTER — Encounter: Payer: Self-pay | Admitting: Internal Medicine

## 2015-10-18 VITALS — BP 118/76 | HR 56 | Temp 97.7°F | Resp 14 | Ht 65.0 in | Wt 168.1 lb

## 2015-10-18 DIAGNOSIS — F419 Anxiety disorder, unspecified: Secondary | ICD-10-CM

## 2015-10-18 DIAGNOSIS — F32A Depression, unspecified: Secondary | ICD-10-CM

## 2015-10-18 DIAGNOSIS — E785 Hyperlipidemia, unspecified: Secondary | ICD-10-CM | POA: Diagnosis not present

## 2015-10-18 DIAGNOSIS — Z0000 Encounter for general adult medical examination without abnormal findings: Secondary | ICD-10-CM | POA: Diagnosis not present

## 2015-10-18 DIAGNOSIS — R5383 Other fatigue: Secondary | ICD-10-CM | POA: Diagnosis not present

## 2015-10-18 DIAGNOSIS — R269 Unspecified abnormalities of gait and mobility: Secondary | ICD-10-CM

## 2015-10-18 DIAGNOSIS — F329 Major depressive disorder, single episode, unspecified: Secondary | ICD-10-CM

## 2015-10-18 DIAGNOSIS — F418 Other specified anxiety disorders: Secondary | ICD-10-CM | POA: Diagnosis not present

## 2015-10-18 DIAGNOSIS — R29898 Other symptoms and signs involving the musculoskeletal system: Secondary | ICD-10-CM | POA: Diagnosis not present

## 2015-10-18 LAB — BASIC METABOLIC PANEL
BUN: 18 mg/dL (ref 6–23)
CHLORIDE: 105 meq/L (ref 96–112)
CO2: 30 meq/L (ref 19–32)
Calcium: 9.1 mg/dL (ref 8.4–10.5)
Creatinine, Ser: 0.94 mg/dL (ref 0.40–1.20)
GFR: 61.11 mL/min (ref >60.00)
GLUCOSE: 92 mg/dL (ref 70–99)
POTASSIUM: 4.1 meq/L (ref 3.5–5.1)
SODIUM: 140 meq/L (ref 135–145)

## 2015-10-18 LAB — CBC WITH DIFFERENTIAL/PLATELET
BASOS PCT: 0.5 % (ref 0.0–3.0)
Basophils Absolute: 0 10*3/uL (ref 0.0–0.1)
EOS PCT: 3.3 % (ref 0.0–5.0)
Eosinophils Absolute: 0.1 10*3/uL (ref 0.0–0.7)
HCT: 40 % (ref 36.0–46.0)
HEMOGLOBIN: 13.8 g/dL (ref 12.0–15.0)
LYMPHS ABS: 1.7 10*3/uL (ref 0.7–4.0)
LYMPHS PCT: 38.5 % (ref 12.0–46.0)
MCHC: 34.6 g/dL (ref 30.0–36.0)
MCV: 93.1 fl (ref 78.0–100.0)
MONO ABS: 0.4 10*3/uL (ref 0.1–1.0)
MONOS PCT: 9.2 % (ref 3.0–12.0)
Neutro Abs: 2.1 10*3/uL (ref 1.4–7.7)
Neutrophils Relative %: 48.5 % (ref 43.0–77.0)
Platelets: 232 10*3/uL (ref 150.0–400.0)
RBC: 4.29 Mil/uL (ref 3.87–5.11)
RDW: 13 % (ref 11.5–15.5)
WBC: 4.4 10*3/uL (ref 4.0–10.5)

## 2015-10-18 LAB — FOLATE: Folate: 21.1 ng/mL (ref >5.9)

## 2015-10-18 LAB — VITAMIN B12: Vitamin B-12: 998 pg/mL — ABNORMAL HIGH (ref 211–911)

## 2015-10-18 LAB — ALT: ALT: 18 U/L (ref 0–35)

## 2015-10-18 LAB — AST: AST: 23 U/L (ref 0–37)

## 2015-10-18 LAB — TSH: TSH: 4.47 u[IU]/mL (ref 0.35–4.50)

## 2015-10-18 MED ORDER — BUPROPION HCL ER (SR) 150 MG PO TB12: 300.0000 mg | ORAL_TABLET | Freq: Every day | ORAL | 0 refills | Status: DC

## 2015-10-18 NOTE — Assessment & Plan Note (Addendum)
Td 2013 ; Pneumonia shot 2011; prevnar 2015; Zostavax 2008   Female Care No further PAP, see prevous entries   breast exam 2016 (-),  MMG 09-2014 (-)  History of colonoscopies in 2004, 2007 and 2012. Next due , plans to call GI Dr. Ewing SchleinMagod   DEXA [id number] (-), rec  Ca and vit intake Diet-exercise discussed

## 2015-10-18 NOTE — Progress Notes (Signed)
Pre visit review using our clinic review tool, if applicable. No additional management support is needed unless otherwise documented below in the visit note. 

## 2015-10-18 NOTE — Patient Instructions (Signed)
Get your blood work before you leave   Hold simvastatin for 2 weeks  Continue Lexapro, increase Wellbutrin to 2 tablets a day. Prescription sent. Call if side effects  Next visit 6 weeks   Fall Prevention and Home Safety Falls cause injuries and can affect all age groups. It is possible to use preventive measures to significantly decrease the likelihood of falls. There are many simple measures which can make your home safer and prevent falls. OUTDOORS  Repair cracks and edges of walkways and driveways.  Remove high doorway thresholds.  Trim shrubbery on the main path into your home.  Have good outside lighting.  Clear walkways of tools, rocks, debris, and clutter.  Check that handrails are not broken and are securely fastened. Both sides of steps should have handrails.  Have leaves, snow, and ice cleared regularly.  Use sand or salt on walkways during winter months.  In the garage, clean up grease or oil spills. BATHROOM  Install night lights.  Install grab bars by the toilet and in the tub and shower.  Use non-skid mats or decals in the tub or shower.  Place a plastic non-slip stool in the shower to sit on, if needed.  Keep floors dry and clean up all water on the floor immediately.  Remove soap buildup in the tub or shower on a regular basis.  Secure bath mats with non-slip, double-sided rug tape.  Remove throw rugs and tripping hazards from the floors. BEDROOMS  Install night lights.  Make sure a bedside light is easy to reach.  Do not use oversized bedding.  Keep a telephone by your bedside.  Have a firm chair with side arms to use for getting dressed.  Remove throw rugs and tripping hazards from the floor. KITCHEN  Keep handles on pots and pans turned toward the center of the stove. Use back burners when possible.  Clean up spills quickly and allow time for drying.  Avoid walking on wet floors.  Avoid hot utensils and knives.  Position  shelves so they are not too high or low.  Place commonly used objects within easy reach.  If necessary, use a sturdy step stool with a grab bar when reaching.  Keep electrical cables out of the way.  Do not use floor polish or wax that makes floors slippery. If you must use wax, use non-skid floor wax.  Remove throw rugs and tripping hazards from the floor. STAIRWAYS  Never leave objects on stairs.  Place handrails on both sides of stairways and use them. Fix any loose handrails. Make sure handrails on both sides of the stairways are as long as the stairs.  Check carpeting to make sure it is firmly attached along stairs. Make repairs to worn or loose carpet promptly.  Avoid placing throw rugs at the top or bottom of stairways, or properly secure the rug with carpet tape to prevent slippage. Get rid of throw rugs, if possible.  Have an electrician put in a light switch at the top and bottom of the stairs. OTHER FALL PREVENTION TIPS  Wear low-heel or rubber-soled shoes that are supportive and fit well. Wear closed toe shoes.  When using a stepladder, make sure it is fully opened and both spreaders are firmly locked. Do not climb a closed stepladder.  Add color or contrast paint or tape to grab bars and handrails in your home. Place contrasting color strips on first and last steps.  Learn and use mobility aids as needed. Install an  electrical emergency response system.  Turn on lights to avoid dark areas. Replace light bulbs that burn out immediately. Get light switches that glow.  Arrange furniture to create clear pathways. Keep furniture in the same place.  Firmly attach carpet with non-skid or double-sided tape.  Eliminate uneven floor surfaces.  Select a carpet pattern that does not visually hide the edge of steps.  Be aware of all pets. OTHER HOME SAFETY TIPS  Set the water temperature for 120 F (48.8 C).  Keep emergency numbers on or near the telephone.  Keep  smoke detectors on every level of the home and near sleeping areas. Document Released: 01/30/2002 Document Revised: 08/11/2011 Document Reviewed: 05/01/2011 [facility] Patient Information 2015 [place], [place]. This information is not intended to replace advice given to you by your health care provider. Make sure you discuss any questions you have with your health care provider.   Preventive Care for Adults Ages 78 and over  Blood pressure check.** / Every 1 to 2 years.  Lipid and cholesterol check.**/ Every 5 years beginning at age 78.  Lung cancer screening. / Every year if you are aged 55-80 years and have a 30-pack-year history of smoking and currently smoke or have quit within the past 15 years. Yearly screening is stopped once you have quit smoking for at least 15 years or develop a health problem that would prevent you from having lung cancer treatment.  Fecal occult blood test (FOBT) of stool. / Every year beginning at age 103 and continuing until age 63. You may not have to do this test if you get a colonoscopy every 10 years.  Flexible sigmoidoscopy** or colonoscopy.** / Every 5 years for a flexible sigmoidoscopy or every 10 years for a colonoscopy beginning at age 78 and continuing until age 64.  Hepatitis C blood test.** / For all people born from [birth year] through 1963 and any individual with known risks for hepatitis C.  Abdominal aortic aneurysm (AAA) screening.** / A one-time screening for ages 78 to 29 years who are current or former smokers.  Skin self-exam. / Monthly.  Influenza vaccine. / Every year.  Tetanus, diphtheria, and acellular pertussis (Tdap/Td) vaccine.** / 1 dose of Td every 10 years.  Varicella vaccine.** / Consult your health care provider.  Zoster vaccine.** / 1 dose for adults aged 78 years or older.  Pneumococcal 13-valent conjugate (PCV13) vaccine.** / Consult your health care provider.  Pneumococcal polysaccharide (PPSV23) vaccine.** / 1 dose for all  adults aged 78 years and older.  Meningococcal vaccine.** / Consult your health care provider.  Hepatitis A vaccine.** / Consult your health care provider.  Hepatitis B vaccine.** / Consult your health care provider.  Haemophilus influenzae type b (Hib) vaccine.** / Consult your health care provider. **Family history and personal history of risk and conditions may change your health care provider's recommendations. Document Released: 04/07/2001 Document Revised: 02/14/2013 Document Reviewed: 07/07/2010 [facility] Patient Information 2015 [place], [place]. This information is not intended to replace advice given to you by your health care provider. Make sure you discuss any questions you have with your health care provider.

## 2015-10-18 NOTE — Progress Notes (Signed)
Subjective:    Patient ID: Andrea Martin, female    DOB: 02/18/1936, 80 y.o.   MRN: [id number]  DOS:  10/18/2015 Type of visit - description : Complete physical exam Interval history: High cholesterol: Good compliance with medication  Anxiety depression: Well controlled? Admits to depressive mood on and off, some of her friends are sick, some stress related to family. Leg tiredness and heaviness: Has a steady feeling of tiredness in the legs, no claudication per se, no actual pain. She has a history of neuropathy, previously her symptoms were feet numbness.  Some fatigue, occasional needs a nap in the afternoon.    Review of Systems  Constitutional: No fever. No chills. No unexplained wt changes. No unusual sweats  HEENT: No dental problems, no ear discharge, no facial swelling, no voice changes. No eye discharge, no eye  redness , no  intolerance to light   Respiratory: No wheezing , no  difficulty breathing. No cough , no mucus production  Cardiovascular: No CP, no leg swelling , no  Palpitations  GI: no nausea, no vomiting, no diarrhea , no  abdominal pain.  No blood in the stools. No dysphagia, no odynophagia    Endocrine: No polyphagia, no polyuria , no polydipsia  GU: No dysuria, gross hematuria, difficulty urinating. No urinary urgency, no frequency.  Musculoskeletal: see above  Skin: No change in the color of the skin, palor , no  Rash  Allergic, immunologic: No environmental allergies , no  food allergies  Neurological: No dizziness no  syncope. No headaches. No diplopia, no slurred, no slurred speech, no motor deficits, no facial  Numbness  Hematological: No enlarged lymph nodes, no easy bruising , no unusual bleedings  Psychiatry: No suicidal ideas, no hallucinations, no beavior problems, no confusion.    Past Medical History:  Diagnosis Date  . Anxiety and depression   . Gait disorder   . Hyperlipidemia   . Neuropathy (HCC) 08/04/2013  . Shoulder  dislocation    h/o after a fall    Past Surgical History:  Procedure Laterality Date  . NO PAST SURGERIES      Social History   Social History  . Marital status: Married    Spouse name: N/A  . Number of children: 1  . Years of education: N/A   Occupational History  . retired    Social History Main Topics  . Smoking status: Never Smoker  . Smokeless tobacco: Never Used  . Alcohol use No  . Drug use: No  . Sexual activity: Not on file   Other Topics Concern  . Not on file   Social History Narrative   Lives w/ husband , share the house w/ daughter      Family History  Problem Relation Age of Onset  . Heart disease Father     CAD?  Marland Kitchen. Coronary artery disease Brother 8850  . Heart disease Mother   . Breast cancer Neg Hx   . Colon cancer Neg Hx   . Diabetes Neg Hx   . Stroke Neg Hx        Medication List       Accurate as of 10/18/15 11:59 PM. Always use your most recent med list.          aspirin 81 MG tablet Take 81 mg by mouth daily.   B-6 100 MG Tabs Take 100 mg by mouth daily.   buPROPion 150 MG 12 hr tablet Commonly known as:  WELLBUTRIN SR Take  2 tablets (300 mg total) by mouth daily.   calcium carbonate 600 MG Tabs tablet Commonly known as:  OS-CAL Take 600 mg by mouth daily.   escitalopram 20 MG tablet Commonly known as:  LEXAPRO Take 1 tablet (20 mg total) by mouth daily.   fish oil-omega-3 fatty acids 1000 MG capsule Take 1 g by mouth daily.   glucosamine-chondroitin 500-400 MG tablet Take 1 tablet by mouth daily.   multivitamin tablet Take 1 tablet by mouth daily.   simvastatin 40 MG tablet Commonly known as:  ZOCOR Take 1 tablet (40 mg total) by mouth every evening.   VITAMIN B 12 PO Take 1 tablet by mouth daily.   Vitamin D-3 1000 units Caps Take 1 capsule by mouth daily.          Objective:   Physical Exam BP 118/76 (BP Location: Left Arm, Patient Position: Sitting, Cuff Size: Small)   Pulse (!) 56   Temp 97.7  F (36.5 C) (Oral)   Resp 14   Ht 5\' 5"  (1.651 m)   Wt 168 lb 2 oz (76.3 kg)   SpO2 98%   BMI 27.98 kg/m   General:   Well developed, well nourished . NAD.  Neck: No  thyromegaly  HEENT:  Normocephalic . Face symmetric, atraumatic Lungs:  CTA B Normal respiratory effort, no intercostal retractions, no accessory muscle use. Heart: RRR,  no murmur.  No pretibial edema bilaterally normal femoral and pedal pulses bilaterally Abdomen:  Not distended, soft, non-tender. No rebound or rigidity.   Skin: Exposed areas without rash. Not pale. Not jaundice Neurologic:  alert & oriented X3.  Speech normal, gait appropriate for age and unassisted Strength symmetric and appropriate for age.  Psych: Cognition and judgment appear intact.  Cooperative with normal attention span and concentration.  Behavior appropriate. No anxious or depressed appearing.    Assessment & Plan:   Assessment   Hyperlipidemia Neuropathy: DX 2015, declined NCS Anxiety depression Gait d/o, falls, referred to PT 2014  PLAN: Hyperlipidemia: On simvastatin, due for labs: BMP, AST, FLP Depression: Not as well-controlled as before. After some counseling with agreed on continue Lexapro 20 mg, increase Wellbutrin to 300 mg daily. f/u 6 weeks. Also recommend formal counseling. Leg tiredness, heaviness:  vascular exam normal, no significant back pain, h/o  Neuropathy but previous sx described as feet numbness. RX: trial w/o simvastatin for 2 weeks, check B12, CBC, folic acid and vitamin D. Fatigue: Labs RTC 6 weeks

## 2015-10-20 NOTE — Assessment & Plan Note (Signed)
Hyperlipidemia: On simvastatin, due for labs: BMP, AST, FLP Depression: Not as well-controlled as before. After some counseling with agreed on continue Lexapro 20 mg, increase Wellbutrin to 300 mg daily. f/u 6 weeks. Also recommend formal counseling. Leg tiredness, heaviness:  vascular exam normal, no significant back pain, h/o  Neuropathy but previous sx described as feet numbness. RX: trial w/o simvastatin for 2 weeks, check B12, CBC, folic acid and vitamin D. Fatigue: Labs RTC 6 weeks

## 2015-10-22 LAB — VITAMIN D 1,25 DIHYDROXY
VITAMIN D3 1, 25 (OH): 36 pg/mL
Vitamin D 1, 25 (OH)2 Total: 36 pg/mL (ref 18–72)
Vitamin D2 1, 25 (OH)2: <8 pg/mL

## 2015-11-29 ENCOUNTER — Ambulatory Visit (INDEPENDENT_AMBULATORY_CARE_PROVIDER_SITE_OTHER): Payer: Medicare Other | Admitting: Internal Medicine

## 2015-11-29 VITALS — BP 142/60 | HR 58 | Temp 98.1°F | Resp 16 | Ht 64.0 in | Wt 168.6 lb

## 2015-11-29 DIAGNOSIS — F419 Anxiety disorder, unspecified: Principal | ICD-10-CM

## 2015-11-29 DIAGNOSIS — G250 Essential tremor: Secondary | ICD-10-CM | POA: Diagnosis not present

## 2015-11-29 DIAGNOSIS — Z23 Encounter for immunization: Secondary | ICD-10-CM

## 2015-11-29 DIAGNOSIS — E785 Hyperlipidemia, unspecified: Secondary | ICD-10-CM | POA: Diagnosis not present

## 2015-11-29 DIAGNOSIS — F32A Depression, unspecified: Secondary | ICD-10-CM

## 2015-11-29 DIAGNOSIS — F418 Other specified anxiety disorders: Secondary | ICD-10-CM

## 2015-11-29 DIAGNOSIS — F329 Major depressive disorder, single episode, unspecified: Secondary | ICD-10-CM

## 2015-11-29 NOTE — Assessment & Plan Note (Signed)
Depression: Since the last visit, she did not reach for counseling but Wellbutrin was increased and she continue with Lexapro. Reports he is feeling very well. No change. Leg tiredness and heaviness: Labs were normal, trial without simvastatin definitely decrease the sx. Will discontinue simvastatin and reassess on RTC High cholesterol: see above, D/c simvastatin, work on diet and exercise, reassess next visit Essential tremor: Sx c/w essential tremor, currently patient is not concerned and ADLs not affected.  Will consider medication at some point. RTC 3 months

## 2015-11-29 NOTE — Patient Instructions (Signed)
Stop simvastatin  increase your physical activity and eat healthy  Come back in 3 months, fasting. Please make an appointment

## 2015-11-29 NOTE — Progress Notes (Signed)
Pre visit review using our clinic review tool, if applicable. No additional management support is needed unless otherwise documented below in the visit note. 

## 2015-11-29 NOTE — Progress Notes (Signed)
Subjective:    Patient ID: Andrea Martin, female    DOB: 1935-05-30, 80 y.o.   MRN: [id number]  DOS:  11/29/2015 Type of visit - description : Follow-up Interval history: Depression: Improved with recent medication changes. She was having leg heaviness, simvastatin was held, symptoms definitely decreased and came back to some extent as soon as she went back on simvastatin. Also, one year history of tremor, mostly in her hands, is an action tremor, no head-jaw tremor. Is not affecting her ADLs, her family is concerned more than she is.   Review of Systems  No nausea, vomiting, diarrhea. No constipation No suicidal ideas Past Medical History:  Diagnosis Date  . Anxiety and depression   . Gait disorder   . Hyperlipidemia   . Neuropathy (HCC) 08/04/2013  . Shoulder dislocation    h/o after a fall    Past Surgical History:  Procedure Laterality Date  . NO PAST SURGERIES      Social History   Social History  . Marital status: Married    Spouse name: N/A  . Number of children: 1  . Years of education: N/A   Occupational History  . retired    Social History Main Topics  . Smoking status: Never Smoker  . Smokeless tobacco: Never Used  . Alcohol use No  . Drug use: No  . Sexual activity: Not on file   Other Topics Concern  . Not on file   Social History Narrative   Lives w/ husband , share the house w/ daughter         Medication List       Accurate as of 11/29/15  8:58 AM. Always use your most recent med list.          aspirin 81 MG tablet Take 81 mg by mouth daily.   B-6 100 MG Tabs Take 100 mg by mouth daily.   buPROPion 150 MG 12 hr tablet Commonly known as:  WELLBUTRIN SR Take 2 tablets (300 mg total) by mouth daily.   calcium carbonate 600 MG Tabs tablet Commonly known as:  OS-CAL Take 600 mg by mouth daily.   escitalopram 20 MG tablet Commonly known as:  LEXAPRO Take 1 tablet (20 mg total) by mouth daily.   fish oil-omega-3 fatty acids  1000 MG capsule Take 1 g by mouth daily.   glucosamine-chondroitin 500-400 MG tablet Take 1 tablet by mouth daily.   multivitamin tablet Take 1 tablet by mouth daily.   simvastatin 40 MG tablet Commonly known as:  ZOCOR Take 1 tablet (40 mg total) by mouth every evening.   VITAMIN B 12 PO Take 1 tablet by mouth daily.   Vitamin D-3 1000 units Caps Take 1 capsule by mouth daily.          Objective:   Physical Exam BP (!) 142/60 (BP Location: Right Arm, Patient Position: Sitting, Cuff Size: Normal)   Pulse (!) 58   Temp 98.1 F (36.7 C) (Oral)   Resp 16   Ht 5\' 4"  (1.626 m)   Wt 168 lb 9.6 oz (76.5 kg)   SpO2 98%   BMI 28.94 kg/m  General:   Well developed, well nourished . NAD.  HEENT:  Normocephalic . Face symmetric, atraumatic Skin: Not pale. Not jaundice Neurologic:  alert & oriented X3.  Speech normal, gait appropriate for age and unassisted. Able to initiate - stop her gait  and turn without problems. + Action tremor hands, no  head or  jaw Psych--  Cognition and judgment appear intact.  Cooperative with normal attention span and concentration.  Behavior appropriate. No anxious or depressed appearing.      Assessment & Plan:   Assessment   Hyperlipidemia Neuropathy: DX 2015, declined NCS Anxiety depression Gait d/o, falls, referred to PT 2014 Essential tremor DX 11-2015  PLAN: Depression: Since the last visit, she did not reach for counseling but Wellbutrin was increased and she continue with Lexapro. Reports he is feeling very well. No change. Leg tiredness and heaviness: Labs were normal, trial without simvastatin definitely decrease the sx. Will discontinue simvastatin and reassess on RTC High cholesterol: see above, D/c simvastatin, work on diet and exercise, reassess next visit Essential tremor: Sx c/w essential tremor, currently patient is not concerned and ADLs not affected.  Will consider medication at some point. RTC 3 months

## 2015-12-06 ENCOUNTER — Other Ambulatory Visit: Payer: Self-pay | Admitting: Internal Medicine

## 2015-12-18 DIAGNOSIS — Z86010 Personal history of colonic polyps: Secondary | ICD-10-CM | POA: Diagnosis not present

## 2015-12-18 DIAGNOSIS — K648 Other hemorrhoids: Secondary | ICD-10-CM | POA: Diagnosis not present

## 2015-12-18 LAB — HM COLONOSCOPY

## 2015-12-27 ENCOUNTER — Encounter: Payer: Self-pay | Admitting: Internal Medicine

## 2015-12-31 ENCOUNTER — Telehealth: Payer: Self-pay | Admitting: Internal Medicine

## 2015-12-31 NOTE — Telephone Encounter (Signed)
[facility] [employer] - called in to follow up on a coding clarification form. She says that her coding area need clarification about a visit.  ° ° °She is going to refax in [place] attention.  °

## 2016-01-07 NOTE — Telephone Encounter (Signed)
Caller name: Aldrine with [employer] °Can be reached: [phone] °  °Reason for call: Call received asking if we received coding clarification form. He stated it was faxed 12/31/15 for the 2nd time to [phone]. He is refaxing today. °

## 2016-01-14 NOTE — Telephone Encounter (Signed)
[facility]. Informed that I had faxed back several weeks ago, unsure of exact date. Forms have been sent for scanning at our off site location. [employer] verbalized understanding.

## 2016-01-14 NOTE — Telephone Encounter (Signed)
Called [employer] back and spoke w/ Aldrina at [phone] and informed that forms were completed and faxed back on 12/30/2015 at [phone]. Aldrina verbalized understanding.

## 2016-01-27 ENCOUNTER — Telehealth: Payer: Self-pay | Admitting: *Deleted

## 2016-01-27 NOTE — Telephone Encounter (Signed)
Received fax from [employer] requesting clarification/inquiry to the specific type of depression; forwarded to provider/SLS 12/04

## 2016-01-28 NOTE — Telephone Encounter (Signed)
Received form again. Form completed and again faxed back to [phone]. Forms sent for scanning.

## 2016-02-10 ENCOUNTER — Telehealth: Payer: Self-pay | Admitting: Internal Medicine

## 2016-02-10 ENCOUNTER — Encounter: Payer: Self-pay | Admitting: Internal Medicine

## 2016-02-10 MED ORDER — PRIMIDONE 50 MG PO TABS: ORAL_TABLET | ORAL | 0 refills | Status: DC

## 2016-02-10 NOTE — Telephone Encounter (Signed)
Spoke w/ Pt, informed of recommendations and how to take Mysoline. Pt verbalized understanding and knows to let us know if not improving or if medication reactions. She has appt scheduled 03/03/2016.

## 2016-02-10 NOTE — Telephone Encounter (Signed)
See patient's message, tremors are getting  worse. My impression when I saw her was essential tremors. Her heart rate is low at baseline consequently will avoid beta blockers and start her on a low dose of Mysoline:  Start  50 mg qd x 1 week, then  increase to bid x 1 week if needed and to TID if needed Watch for excessive sedation. Call in one month and let us know how she's doing.

## 2016-02-10 NOTE — Telephone Encounter (Signed)
Rx faxed to [facility] pharmacy.  

## 2016-02-14 ENCOUNTER — Other Ambulatory Visit: Payer: Self-pay | Admitting: Internal Medicine

## 2016-03-02 ENCOUNTER — Telehealth: Payer: Self-pay | Admitting: *Deleted

## 2016-03-02 NOTE — Telephone Encounter (Signed)
Called patient and left message to return call

## 2016-03-03 ENCOUNTER — Telehealth: Payer: Self-pay | Admitting: Internal Medicine

## 2016-03-03 ENCOUNTER — Ambulatory Visit: Payer: Self-pay | Admitting: Internal Medicine

## 2016-03-03 NOTE — Telephone Encounter (Signed)
No, thx 

## 2016-03-03 NOTE — Telephone Encounter (Signed)
Patient no showed appointment on 03/03/16 due to being sick. Charge or no charge?

## 2016-03-26 ENCOUNTER — Encounter: Payer: Self-pay | Admitting: Internal Medicine

## 2016-03-26 ENCOUNTER — Ambulatory Visit (INDEPENDENT_AMBULATORY_CARE_PROVIDER_SITE_OTHER): Payer: Medicare Other | Admitting: Internal Medicine

## 2016-03-26 VITALS — BP 124/68 | HR 59 | Temp 98.0°F | Resp 14 | Ht 64.0 in | Wt 166.5 lb

## 2016-03-26 DIAGNOSIS — G250 Essential tremor: Secondary | ICD-10-CM | POA: Diagnosis not present

## 2016-03-26 DIAGNOSIS — E785 Hyperlipidemia, unspecified: Secondary | ICD-10-CM

## 2016-03-26 DIAGNOSIS — F418 Other specified anxiety disorders: Secondary | ICD-10-CM | POA: Diagnosis not present

## 2016-03-26 DIAGNOSIS — F419 Anxiety disorder, unspecified: Secondary | ICD-10-CM

## 2016-03-26 DIAGNOSIS — F329 Major depressive disorder, single episode, unspecified: Secondary | ICD-10-CM

## 2016-03-26 DIAGNOSIS — F32A Depression, unspecified: Secondary | ICD-10-CM

## 2016-03-26 NOTE — Progress Notes (Signed)
Pre visit review using our clinic review tool, if applicable. No additional management support is needed unless otherwise documented below in the visit note. 

## 2016-03-26 NOTE — Progress Notes (Signed)
Subjective:    Patient ID: Andrea Martin, female    DOB: 04/17/1935, 81 y.o.   MRN: [id number]  DOS:  03/26/2016 Type of visit - description : Routine checkup Interval history: Depression: Symptoms well-controlled High cholesterol: Simvastatin was stopped, leg discomfort gone, room for improvement on diet and exercise. Essential tremor: She tried primidone, even with a low dose got extremely dizzy and self discontinued. Symptoms are mild, only bother her when she tries to write, symptoms increase when she is stressed out.  Review of Systems No chest pain, difficulty breathing No headache or dizziness  Past Medical History:  Diagnosis Date  . Anxiety and depression   . Gait disorder   . Hyperlipidemia   . Neuropathy (HCC) 08/04/2013  . Shoulder dislocation    h/o after a fall    Past Surgical History:  Procedure Laterality Date  . NO PAST SURGERIES      Social History   Social History  . Marital status: Married    Spouse name: N/A  . Number of children: 1  . Years of education: N/A   Occupational History  . retired    Social History Main Topics  . Smoking status: Never Smoker  . Smokeless tobacco: Never Used  . Alcohol use No  . Drug use: No  . Sexual activity: Not on file   Other Topics Concern  . Not on file   Social History Narrative   Lives w/ husband , share the house w/ daughter       Allergies as of 03/26/2016      Reactions   Codeine Nausea And Vomiting   Penicillins Rash      Medication List       Accurate as of 03/26/16  5:33 PM. Always use your most recent med list.          aspirin 81 MG tablet Take 81 mg by mouth daily.   B-6 100 MG Tabs Take 100 mg by mouth daily.   buPROPion 150 MG 12 hr tablet Commonly known as:  WELLBUTRIN SR Take 2 tablets (300 mg total) by mouth daily.   calcium carbonate 600 MG Tabs tablet Commonly known as:  OS-CAL Take 600 mg by mouth daily.   escitalopram 20 MG tablet Commonly known as:   LEXAPRO Take 1 tablet (20 mg total) by mouth daily.   fish oil-omega-3 fatty acids 1000 MG capsule Take 1 g by mouth daily.   glucosamine-chondroitin 500-400 MG tablet Take 1 tablet by mouth daily.   multivitamin tablet Take 1 tablet by mouth daily.   VITAMIN B 12 PO Take 1 tablet by mouth daily.   Vitamin D-3 1000 units Caps Take 1 capsule by mouth daily.          Objective:   Physical Exam BP 124/68 (BP Location: Left Arm, Patient Position: Sitting, Cuff Size: Normal)   Pulse (!) 59   Temp 98 F (36.7 C) (Oral)   Resp 14   Ht 5\' 4"  (1.626 m)   Wt 166 lb 8 oz (75.5 kg)   SpO2 98%   BMI 28.58 kg/m  General:   Well developed, well nourished . NAD.  HEENT:  Normocephalic . Face symmetric, atraumatic Lungs:  CTA B Normal respiratory effort, no intercostal retractions, no accessory muscle use. Heart: RRR,  no murmur.  No pretibial edema bilaterally  Skin: Not pale. Not jaundice Neurologic:  alert & oriented X3.  Speech normal, gait appropriate for age and unassisted. Mild action tremor  on hands, slightly more noticeable on the left Psych--  Cognition and judgment appear intact.  Cooperative with normal attention span and concentration.  Behavior appropriate. No anxious or depressed appearing.      Assessment & Plan:   Assessment   Hyperlipidemia Neuropathy: DX 2015, feet numbness, decreased pinprick exam,  declined NCS Tremor, likely essential  Anxiety depression Gait d/o, falls, referred to PT 2014 Essential tremor DX 11-2015  PLAN: Hypelipidemia: She is stopped simvastatin, aches stopped. D/w pt  restart medication vs. wait longer. We agree on wait longer, to work on lifestyle and reassess on RTC Leg tiredness and heaviness: Resolved completely Tremors: Since last OC, requested treatment, tried primidone, even at a low dose she got extremely dizzy. We discussed possibly gabapentin or Topamax but at this point sxs are not severe enough that does not  like to try other options. Depression: Well controlled RTC 6 months CPX F2F !%

## 2016-03-26 NOTE — Assessment & Plan Note (Signed)
Hypelipidemia: She is stopped simvastatin, aches stopped. D/w pt  restart medication vs. wait longer. We agree on wait longer, to work on lifestyle and reassess on RTC Leg tiredness and heaviness: Resolved completely Tremors: Since last OC, requested treatment, tried primidone, even at a low dose she got extremely dizzy. We discussed possibly gabapentin or Topamax but at this point sxs are not severe enough that does not like to try other options. Depression: Well controlled RTC 6 months CPX

## 2016-03-26 NOTE — Patient Instructions (Signed)
  GO TO THE FRONT DESK Schedule your next appointment for a  Physical, fasting,  in 6 months

## 2016-04-07 DIAGNOSIS — H2513 Age-related nuclear cataract, bilateral: Secondary | ICD-10-CM | POA: Diagnosis not present

## 2016-04-07 DIAGNOSIS — H5203 Hypermetropia, bilateral: Secondary | ICD-10-CM | POA: Diagnosis not present

## 2016-09-06 ENCOUNTER — Other Ambulatory Visit: Payer: Self-pay | Admitting: Internal Medicine

## 2016-10-19 ENCOUNTER — Encounter: Payer: Self-pay | Admitting: Internal Medicine

## 2016-11-02 ENCOUNTER — Ambulatory Visit (INDEPENDENT_AMBULATORY_CARE_PROVIDER_SITE_OTHER): Payer: Medicare Other | Admitting: Internal Medicine

## 2016-11-02 ENCOUNTER — Encounter: Payer: Self-pay | Admitting: Internal Medicine

## 2016-11-02 VITALS — BP 122/68 | HR 68 | Temp 98.1°F | Resp 14 | Ht 64.0 in | Wt 170.1 lb

## 2016-11-02 DIAGNOSIS — Z0000 Encounter for general adult medical examination without abnormal findings: Secondary | ICD-10-CM

## 2016-11-02 DIAGNOSIS — Z1231 Encounter for screening mammogram for malignant neoplasm of breast: Secondary | ICD-10-CM

## 2016-11-02 NOTE — Patient Instructions (Signed)
  GO TO THE FRONT DESK Schedule fasting labs to be done this week  Schedule your next appointment for a  routine checkup in 6 months   Schedule Medicare wellness at your convenience with one of our nurses

## 2016-11-02 NOTE — Progress Notes (Signed)
Subjective:    Patient ID: Andrea Martin, female    DOB: 1935-04-17, 81 y.o.   MRN: [id number]  DOS:  11/02/2016 Type of visit - description : cpx Interval history: Here for a CPX. We also manage her chronic medical problems and address other symptoms.    Review of Systems Has been tired lately, this is a chronic issue, usually worse during the summer times. Is simply feeling fatigue, lack of energy, denies chest pain or dyspnea on exertion. Also, during the summer she is not as active and does not go out much due to the weather and felt somewhat depressed however that is getting better. Occasional imbalance issue, no dizzy per se. No diplopia, slurred speech or facial numbness. Twice last month she saw some blood in the underwear, she is certain it wasn't vaginal bleeding or hematuria, she think is rectal bleeding. She saw some fresh blood when she wiped the toilet paper. Denies any rectal itching or pain.   Other than above, a 14 point review of systems is negative      Past Medical History:  Diagnosis Date  . Anxiety and depression   . Gait disorder   . Hyperlipidemia   . Neuropathy 08/04/2013  . Shoulder dislocation    h/o after a fall    Past Surgical History:  Procedure Laterality Date  . NO PAST SURGERIES      Social History   Social History  . Marital status: Married    Spouse name: N/A  . Number of children: 1  . Years of education: N/A   Occupational History  . retired    Social History Main Topics  . Smoking status: Never Smoker  . Smokeless tobacco: Never Used  . Alcohol use No  . Drug use: No  . Sexual activity: Not on file   Other Topics Concern  . Not on file   Social History Narrative   Lives w/ husband , share the house w/ daughter and g-son     Family History  Problem Relation Age of Onset  . Heart disease Father        CAD?  Marland Kitchen Coronary artery disease Brother 49  . Heart disease Mother   . Breast cancer Neg Hx   . Colon cancer Neg  Hx   . Diabetes Neg Hx   . Stroke Neg Hx      Allergies as of 11/02/2016      Reactions   Codeine Nausea And Vomiting   Penicillins Rash      Medication List       Accurate as of 11/02/16 11:59 PM. Always use your most recent med list.          aspirin 81 MG tablet Take 81 mg by mouth daily.   B-6 100 MG Tabs Take 100 mg by mouth daily.   buPROPion 150 MG 12 hr tablet Commonly known as:  WELLBUTRIN SR Take 2 tablets (300 mg total) by mouth daily.   calcium carbonate 600 MG Tabs tablet Commonly known as:  OS-CAL Take 600 mg by mouth daily.   escitalopram 20 MG tablet Commonly known as:  LEXAPRO Take 1 tablet (20 mg total) by mouth daily.   fish oil-omega-3 fatty acids 1000 MG capsule Take 1 g by mouth daily.   glucosamine-chondroitin 500-400 MG tablet Take 1 tablet by mouth daily.   multivitamin tablet Take 1 tablet by mouth daily.   VITAMIN B 12 PO Take 1 tablet by mouth daily.  Vitamin D-3 1000 units Caps Take 1 capsule by mouth daily.            Discharge Care Instructions        Start     Ordered   11/02/16 0000  Comp Met (CMET)     11/02/16 1547   11/02/16 0000  Lipid panel     11/02/16 1547   11/02/16 0000  CBC w/Diff     11/02/16 1547   11/02/16 0000  TSH     11/02/16 1547   11/02/16 0000  MM Digital Screening    Question Answer Comment  Reason for Exam (SYMPTOM  OR DIAGNOSIS REQUIRED) SCREENING MAMMOGRAM   Preferred imaging location? [facility]      11/02/16 1548         Objective:   Physical Exam BP 122/68 (BP Location: Left Arm, Patient Position: Sitting, Cuff Size: Small)   Pulse 68   Temp 98.1 F (36.7 C) (Oral)   Resp 14   Ht '5\' 4"'  (1.626 m)   Wt 170 lb 2 oz (77.2 kg)   SpO2 96%   BMI 29.20 kg/m   General:   Well developed, well nourished . NAD.  Neck: No  thyromegaly  HEENT:  Normocephalic . Face symmetric, atraumatic Lungs:  CTA B Normal respiratory effort, no intercostal retractions, no accessory  muscle use. Heart: RRR,  no murmur.  No pretibial edema bilaterally  Abdomen:  Not distended, soft, non-tender. No rebound or rigidity.   Skin: Exposed areas without rash. Not pale. Not jaundice Neurologic:  alert & oriented X3.  Speech normal, gait appropriate for age and unassisted Strength symmetric and appropriate for age.  Psych: Cognition and judgment appear intact.  Cooperative with normal attention span and concentration.  Behavior appropriate. No anxious or depressed appearing.    Assessment & Plan:  Assessment   Hyperlipidemia Neuropathy: DX 2015, feet numbness, decreased pinprick exam,  declined NCS Tremor, likely essential  Anxiety depression Gait d/o, falls, referred to PT 2014 Essential tremor DX 11-2015  PLAN: Dyslipidemia: Currently diet controlled, h/o aches with simvastatin. Check labs. Fatigue, tiredness: History of normal B12 and vitamin D. When check in general labs, recommend observation otherwise. Depression: On-off symptoms in the summer (she does not like warm weather and stays indoors most of the summer). Overall is slightly better. Continue Wellbutrin, Lexapro.   RBPR? See ROS, apparently saw some fresh blood per rectum, last colonoscopy completely normal except for internal hemorrhoids. Patient is pretty certain this was no vaginal bleeding or hematuria. We are checking a CBC, if no anemia and no further sxs will rec  observation. Otherwise will need further eval. RTC 6 months

## 2016-11-02 NOTE — Assessment & Plan Note (Addendum)
-  Td 2013 ; Pneumonia shot 2011; prevnar 2015; Zostavax 2008 ; shingrix discussed  -Female Care No further PAP, see prevous entries  schedule a MMG -CCS: History of colonoscopies in 2004, 2007 , 2012,  2017: no polyps, f/u prn, Dr. Ewing SchleinMagod -DEXA 07-2012 (-), rec  Ca and vit intake, check DEXA next year  -Diet-exercise discussed  -Labs: CMP, FLP, CBC, TSH

## 2016-11-02 NOTE — Progress Notes (Signed)
Pre visit review using our clinic review tool, if applicable. No additional management support is needed unless otherwise documented below in the visit note. 

## 2016-11-03 ENCOUNTER — Other Ambulatory Visit (INDEPENDENT_AMBULATORY_CARE_PROVIDER_SITE_OTHER): Payer: Medicare Other

## 2016-11-03 DIAGNOSIS — Z0000 Encounter for general adult medical examination without abnormal findings: Secondary | ICD-10-CM | POA: Diagnosis not present

## 2016-11-03 DIAGNOSIS — E785 Hyperlipidemia, unspecified: Secondary | ICD-10-CM

## 2016-11-03 LAB — CBC WITH DIFFERENTIAL/PLATELET
BASOS ABS: 0 10*3/uL (ref 0.0–0.1)
Basophils Relative: 0.9 % (ref 0.0–3.0)
Eosinophils Absolute: 0.1 10*3/uL (ref 0.0–0.7)
Eosinophils Relative: 2.9 % (ref 0.0–5.0)
HCT: 42.6 % (ref 36.0–46.0)
Hemoglobin: 14.2 g/dL (ref 12.0–15.0)
LYMPHS ABS: 1.5 10*3/uL (ref 0.7–4.0)
LYMPHS PCT: 41.3 % (ref 12.0–46.0)
MCHC: 33.4 g/dL (ref 30.0–36.0)
MCV: 96.1 fl (ref 78.0–100.0)
MONOS PCT: 10.1 % (ref 3.0–12.0)
Monocytes Absolute: 0.4 10*3/uL (ref 0.1–1.0)
NEUTROS PCT: 44.8 % (ref 43.0–77.0)
Neutro Abs: 1.7 10*3/uL (ref 1.4–7.7)
PLATELETS: 260 10*3/uL (ref 150.0–400.0)
RBC: 4.44 Mil/uL (ref 3.87–5.11)
RDW: 13.2 % (ref 11.5–15.5)
WBC: 3.7 10*3/uL — AB (ref 4.0–10.5)

## 2016-11-03 LAB — COMPREHENSIVE METABOLIC PANEL
ALT: 30 U/L (ref 0–35)
AST: 24 U/L (ref 0–37)
Albumin: 4 g/dL (ref 3.5–5.2)
Alkaline Phosphatase: 58 U/L (ref 39–117)
BILIRUBIN TOTAL: 0.5 mg/dL (ref 0.2–1.2)
BUN: 18 mg/dL (ref 6–23)
CALCIUM: 9.3 mg/dL (ref 8.4–10.5)
CHLORIDE: 108 meq/L (ref 96–112)
CO2: 26 meq/L (ref 19–32)
Creatinine, Ser: 0.88 mg/dL (ref 0.40–1.20)
GFR: 65.77 mL/min (ref >60.00)
Glucose, Bld: 92 mg/dL (ref 70–99)
POTASSIUM: 3.9 meq/L (ref 3.5–5.1)
Sodium: 142 mEq/L (ref 135–145)
Total Protein: 6.6 g/dL (ref 6.0–8.3)

## 2016-11-03 LAB — LIPID PANEL
CHOL/HDL RATIO: 6
Cholesterol: 293 mg/dL — ABNORMAL HIGH (ref 0–200)
HDL: 49.6 mg/dL (ref >39.00)
LDL Cholesterol: 213 mg/dL — ABNORMAL HIGH (ref 0–99)
NonHDL: 243.53
TRIGLYCERIDES: 153 mg/dL — AB (ref 0.0–149.0)
VLDL: 30.6 mg/dL (ref 0.0–40.0)

## 2016-11-03 LAB — TSH: TSH: 3.75 u[IU]/mL (ref 0.35–4.50)

## 2016-11-03 NOTE — Assessment & Plan Note (Signed)
Dyslipidemia: Currently diet controlled, h/o aches with simvastatin. Check labs. Fatigue, tiredness: History of normal B12 and vitamin D. When check in general labs, recommend observation otherwise. Depression: On-off symptoms in the summer (she does not like warm weather and stays indoors most of the summer). Overall is slightly better. Continue Wellbutrin, Lexapro.   RBPR? See ROS, apparently saw some fresh blood per rectum, last colonoscopy completely normal except for internal hemorrhoids. Patient is pretty certain this was no vaginal bleeding or hematuria. We are checking a CBC, if no anemia and no further sxs will rec  observation. Otherwise will need further eval. RTC 6 months

## 2016-11-05 MED ORDER — PRAVASTATIN SODIUM 20 MG PO TABS: 20.0000 mg | ORAL_TABLET | Freq: Every day | ORAL | 2 refills | Status: DC

## 2016-11-05 NOTE — Addendum Note (Signed)
Addended byConrad [place]: Khamani Fairley D on: 11/05/2016 10:56 AM   Modules accepted: Orders

## 2016-11-09 ENCOUNTER — Other Ambulatory Visit: Payer: Self-pay | Admitting: Internal Medicine

## 2016-11-21 ENCOUNTER — Other Ambulatory Visit: Payer: Self-pay | Admitting: Internal Medicine

## 2016-11-23 MED ORDER — PRAVASTATIN SODIUM 20 MG PO TABS: 20.0000 mg | ORAL_TABLET | Freq: Every day | ORAL | 0 refills | Status: DC

## 2016-12-15 ENCOUNTER — Ambulatory Visit
Admission: RE | Admit: 2016-12-15 | Discharge: 2016-12-15 | Disposition: A | Discharge status: Home / Self Care | Payer: Medicare Other | Source: Ambulatory Visit | Attending: Internal Medicine | Admitting: Internal Medicine

## 2016-12-15 DIAGNOSIS — Z1231 Encounter for screening mammogram for malignant neoplasm of breast: Secondary | ICD-10-CM | POA: Diagnosis not present

## 2017-01-05 ENCOUNTER — Other Ambulatory Visit (INDEPENDENT_AMBULATORY_CARE_PROVIDER_SITE_OTHER): Payer: Medicare Other

## 2017-01-05 DIAGNOSIS — E785 Hyperlipidemia, unspecified: Secondary | ICD-10-CM

## 2017-01-05 LAB — LIPID PANEL
CHOL/HDL RATIO: 4
Cholesterol: 231 mg/dL — ABNORMAL HIGH (ref 0–200)
HDL: 56.7 mg/dL (ref >39.00)
LDL Cholesterol: 149 mg/dL — ABNORMAL HIGH (ref 0–99)
NONHDL: 174.78
TRIGLYCERIDES: 131 mg/dL (ref 0.0–149.0)
VLDL: 26.2 mg/dL (ref 0.0–40.0)

## 2017-01-05 LAB — AST: AST: 25 U/L (ref 0–37)

## 2017-01-05 LAB — ALT: ALT: 25 U/L (ref 0–35)

## 2017-01-06 MED ORDER — PRAVASTATIN SODIUM 20 MG PO TABS: 20.0000 mg | ORAL_TABLET | Freq: Every day | ORAL | 2 refills | Status: DC

## 2017-01-06 NOTE — Addendum Note (Signed)
Addended byConrad [place]: Damaris Geers D on: 01/06/2017 04:38 PM   Modules accepted: Orders

## 2017-01-09 ENCOUNTER — Other Ambulatory Visit: Payer: Self-pay | Admitting: Internal Medicine

## 2017-02-22 ENCOUNTER — Telehealth: Payer: Self-pay | Admitting: Internal Medicine

## 2017-02-22 NOTE — Telephone Encounter (Signed)
Copied from CRM [id number]. Topic: Quick Communication - See Telephone Encounter >> Feb 22, 2017  2:53 PM Oneal GroutSebastian, Jennifer S wrote: CRM for notification. See Telephone encounter for:  Pharmacy calling would like to speak to someone regarding prescription on buPROPion (WELLBUTRIN SR) 150 MG 12 hr tablet, this exceeds maximium dose.

## 2017-02-24 NOTE — Telephone Encounter (Signed)
Dose is acceptable, okay to proceed

## 2017-02-24 NOTE — Telephone Encounter (Signed)
Please advise. I will call if okay to continue.

## 2017-02-24 NOTE — Telephone Encounter (Signed)
[facility] informed okay to fill as written.

## 2017-04-06 ENCOUNTER — Encounter: Payer: Self-pay | Admitting: Internal Medicine

## 2017-04-06 ENCOUNTER — Ambulatory Visit (INDEPENDENT_AMBULATORY_CARE_PROVIDER_SITE_OTHER): Payer: Medicare Other | Admitting: Internal Medicine

## 2017-04-06 VITALS — BP 126/72 | HR 73 | Temp 97.8°F | Resp 14 | Ht 64.0 in | Wt 174.5 lb

## 2017-04-06 DIAGNOSIS — B349 Viral infection, unspecified: Secondary | ICD-10-CM

## 2017-04-06 NOTE — Progress Notes (Signed)
Subjective:    Patient ID: Andrea Martin, female    DOB: 1935-10-15, 82 y.o.   MRN: [id number]  DOS:  04/06/2017 Type of visit - description : Acute visit Interval history: Symptoms  started a week ago: Cough, chest congestion, hurting all over, feeling weak.  Subjective fever. She took over-the-counter including Tylenol Sinus. Overall is better but her family according to the patient insisted to come here to be checked. States  she is coughing much less, has a small amount of the sputum production. Still has sinus congestion with clear nasal discharge.   Review of Systems  Has some nausea, no  vomiting, diarrhea. Myalgias have improved.  Past Medical History:  Diagnosis Date  . Anxiety and depression   . Gait disorder   . Hyperlipidemia   . Neuropathy 08/04/2013  . Shoulder dislocation    h/o after a fall    Past Surgical History:  Procedure Laterality Date  . NO PAST SURGERIES      Social History   Socioeconomic History  . Marital status: Married    Spouse name: Not on file  . Number of children: 1  . Years of education: Not on file  . Highest education level: Not on file  Social Needs  . Financial resource strain: Not on file  . Food insecurity - worry: Not on file  . Food insecurity - inability: Not on file  . Transportation needs - medical: Not on file  . Transportation needs - non-medical: Not on file  Occupational History  . Occupation: retired  Tobacco Use  . Smoking status: Never Smoker  . Smokeless tobacco: Never Used  Substance and Sexual Activity  . Alcohol use: No  . Drug use: No  . Sexual activity: Not on file  Other Topics Concern  . Not on file  Social History Narrative   Lives w/ husband , share the house w/ daughter and g-son      Allergies as of 04/06/2017      Reactions   Codeine Nausea And Vomiting   Penicillins Rash      Medication List        Accurate as of 04/06/17 11:59 PM. Always use your most recent med list.            aspirin 81 MG tablet Take 81 mg by mouth daily.   B-6 100 MG Tabs Take 100 mg by mouth daily.   buPROPion 150 MG 12 hr tablet Commonly known as:  WELLBUTRIN SR Take 2 tablets (300 mg total) daily by mouth.   calcium carbonate 600 MG Tabs tablet Commonly known as:  OS-CAL Take 600 mg by mouth daily.   escitalopram 20 MG tablet Commonly known as:  LEXAPRO Take 1 tablet (20 mg total) by mouth daily.   fish oil-omega-3 fatty acids 1000 MG capsule Take 1 g by mouth daily.   glucosamine-chondroitin 500-400 MG tablet Take 1 tablet by mouth daily.   multivitamin tablet Take 1 tablet by mouth daily.   pravastatin 20 MG tablet Commonly known as:  PRAVACHOL Take 1 tablet (20 mg total) at bedtime by mouth.   VITAMIN B 12 PO Take 1 tablet by mouth daily.   Vitamin D-3 1000 units Caps Take 1 capsule by mouth daily.          Objective:   Physical Exam BP 126/72 (BP Location: Left Arm, Patient Position: Sitting, Cuff Size: Small)   Pulse 73   Temp 97.8 F (36.6 C) (Oral)  Resp 14   Ht 5\' 4"  (1.626 m)   Wt 174 lb 8 oz (79.2 kg)   SpO2 97%   BMI 29.95 kg/m  General:   Well developed, well nourished . NAD.  HEENT:  Normocephalic . Face symmetric, atraumatic TMs normal.  Nose is slightly congested, sinuses non-TTP.  Throat symmetric, no red Lungs:  CTA B Normal respiratory effort, no intercostal retractions, no accessory muscle use. Heart: RRR,  no murmur.  No pretibial edema bilaterally  Skin: Not pale. Not jaundice Neurologic:  alert & oriented X3.  Speech normal, gait appropriate for age and unassisted Psych--  Cognition and judgment appear intact.  Cooperative with normal attention span and concentration.  Behavior appropriate. No anxious or depressed appearing.      Assessment & Plan:  Assessment   Hyperlipidemia.  Rx Pravachol 10-2016, good results Neuropathy: DX 2015, feet numbness, decreased pinprick exam,  declined NCS Tremor, likely essential   Anxiety depression Gait d/o, falls, referred to PT 2014 Essential tremor DX 11-2015  PLAN: Viral syndrome: Seems like she had a viral syndrome but is recovering without apparent complications.  Rec  supportive care, see AVS.  Definitely call if fever or severe cough resurface. High cholesterol: Started Pravachol 10-2016, tolerated well, good results. RTC March schedule

## 2017-04-06 NOTE — Progress Notes (Signed)
Pre visit review using our clinic review tool, if applicable. No additional management support is needed unless otherwise documented below in the visit note. 

## 2017-04-06 NOTE — Patient Instructions (Signed)
Rest, fluids , tylenol  For cough:  Take Mucinex DM twice a day as needed until better  For nasal congestion: Use OTC Nasocort or Flonase : 2 nasal sprays on each side of the nose in the morning until you feel better  Avoid decongestants such as  Pseudoephedrine or phenylephrine   Call if not gradually better over the next  10 days  Call anytime if the symptoms are severe 

## 2017-04-07 NOTE — Assessment & Plan Note (Signed)
Viral syndrome: Seems like she had a viral syndrome but is recovering without apparent complications.  Rec  supportive care, see AVS.  Definitely call if fever or severe cough resurface. High cholesterol: Started Pravachol 10-2016, tolerated well, good results. RTC March schedule

## 2017-04-30 ENCOUNTER — Ambulatory Visit (INDEPENDENT_AMBULATORY_CARE_PROVIDER_SITE_OTHER): Payer: Medicare Other | Admitting: Internal Medicine

## 2017-04-30 ENCOUNTER — Encounter: Payer: Self-pay | Admitting: Internal Medicine

## 2017-04-30 VITALS — BP 126/68 | HR 68 | Temp 98.1°F | Resp 14 | Ht 64.0 in | Wt 176.1 lb

## 2017-04-30 DIAGNOSIS — G629 Polyneuropathy, unspecified: Secondary | ICD-10-CM | POA: Diagnosis not present

## 2017-04-30 DIAGNOSIS — F419 Anxiety disorder, unspecified: Secondary | ICD-10-CM | POA: Diagnosis not present

## 2017-04-30 DIAGNOSIS — E785 Hyperlipidemia, unspecified: Secondary | ICD-10-CM

## 2017-04-30 DIAGNOSIS — F329 Major depressive disorder, single episode, unspecified: Secondary | ICD-10-CM

## 2017-04-30 DIAGNOSIS — F32A Depression, unspecified: Secondary | ICD-10-CM

## 2017-04-30 NOTE — Patient Instructions (Signed)
   GO TO THE FRONT DESK Schedule your next appointment for a  Physical by 10-2017, fasting

## 2017-04-30 NOTE — Assessment & Plan Note (Signed)
Hyperlipidemia: on Pravachol, LDL improved.  No change Neuropathy: Mild sxs, not an issue at this point Tremors: Slightly worse lately, affecting her handwriting but no other ADLs.  We again talked about medication i.e. gabapentin, Topamax.  Sxs are not severe, will simply observe Anxiety depression: Controlled Labs reviewed, not due for any check. RTC 10-2017 CPX

## 2017-04-30 NOTE — Progress Notes (Signed)
Pre visit review using our clinic review tool, if applicable. No additional management support is needed unless otherwise documented below in the visit note. 

## 2017-04-30 NOTE — Progress Notes (Signed)
Subjective:    Patient ID: Andrea Martin, female    DOB: 1935-09-25, 82 y.o.   MRN: [id number]  DOS:  04/30/2017 Type of visit - description : Routine visit Interval history: No major concerns, good medication compliance, has neuropathy, symptoms mild and not bothersome. Her tremors are slightly worse lately, difficulting her handwriting.  Not affecting other ADLs.   Review of Systems Denies chest pain or difficulty breathing Has occasionally a imbalance feeling when walking, mild, not associated slurred speech, diplopia or focal deficits.  Past Medical History:  Diagnosis Date  . Anxiety and depression   . Gait disorder   . Hyperlipidemia   . Neuropathy 08/04/2013  . Shoulder dislocation    h/o after a fall    Past Surgical History:  Procedure Laterality Date  . NO PAST SURGERIES      Social History   Socioeconomic History  . Marital status: Married    Spouse name: Not on file  . Number of children: 1  . Years of education: Not on file  . Highest education level: Not on file  Social Needs  . Financial resource strain: Not on file  . Food insecurity - worry: Not on file  . Food insecurity - inability: Not on file  . Transportation needs - medical: Not on file  . Transportation needs - non-medical: Not on file  Occupational History  . Occupation: retired  Tobacco Use  . Smoking status: Never Smoker  . Smokeless tobacco: Never Used  Substance and Sexual Activity  . Alcohol use: No  . Drug use: No  . Sexual activity: Not on file  Other Topics Concern  . Not on file  Social History Narrative   Lives w/ husband , share the house w/ daughter and g-son      Allergies as of 04/30/2017      Reactions   Codeine Nausea And Vomiting   Penicillins Rash      Medication List        Accurate as of 04/30/17  3:04 PM. Always use your most recent med list.          aspirin 81 MG tablet Take 81 mg by mouth daily.   B-6 100 MG Tabs Take 100 mg by mouth daily.     buPROPion 150 MG 12 hr tablet Commonly known as:  WELLBUTRIN SR Take 2 tablets (300 mg total) daily by mouth.   calcium carbonate 600 MG Tabs tablet Commonly known as:  OS-CAL Take 600 mg by mouth daily.   escitalopram 20 MG tablet Commonly known as:  LEXAPRO Take 1 tablet (20 mg total) by mouth daily.   fish oil-omega-3 fatty acids 1000 MG capsule Take 1 g by mouth daily.   glucosamine-chondroitin 500-400 MG tablet Take 1 tablet by mouth daily.   multivitamin tablet Take 1 tablet by mouth daily.   pravastatin 20 MG tablet Commonly known as:  PRAVACHOL Take 1 tablet (20 mg total) at bedtime by mouth.   VITAMIN B 12 PO Take 1 tablet by mouth daily.   Vitamin D-3 1000 units Caps Take 1 capsule by mouth daily.          Objective:   Physical Exam BP 126/68 (BP Location: Left Arm, Patient Position: Sitting, Cuff Size: Normal)   Pulse 68   Temp 98.1 F (36.7 C) (Oral)   Resp 14   Ht 5\' 4"  (1.626 m)   Wt 176 lb 2 oz (79.9 kg)   SpO2 98%  BMI 30.23 kg/m  General:   Well developed, well nourished . NAD.  HEENT:  Normocephalic . Face symmetric, atraumatic Lungs:  CTA B Normal respiratory effort, no intercostal retractions, no accessory muscle use. Heart: RRR,  no murmur.  No pretibial edema bilaterally  Skin: Not pale. Not jaundice Neurologic:  alert & oriented X3.  Speech normal, gait appropriate for age and unassisted Hand tremors noted. Psych--  Cognition and judgment appear intact.  Cooperative with normal attention span and concentration.  Behavior appropriate. No anxious or depressed appearing.      Assessment & Plan:    Assessment   Hyperlipidemia.  Rx Pravachol 10-2016, good results Neuropathy: DX 2015, feet numbness, decreased pinprick exam,  declined NCS Tremor, likely essential  Anxiety depression Gait d/o, falls, referred to PT 2014 Essential tremor DX 11-2015  PLAN: Hyperlipidemia: on Pravachol, LDL improved.  No change Neuropathy:  Mild sxs, not an issue at this point Tremors: Slightly worse lately, affecting her handwriting but no other ADLs.  We again talked about medication i.e. gabapentin, Topamax.  Sxs are not severe, will simply observe Anxiety depression: Controlled Labs reviewed, not due for any check. RTC 10-2017 CPX

## 2017-05-03 ENCOUNTER — Ambulatory Visit: Payer: Self-pay | Admitting: Internal Medicine

## 2017-06-30 ENCOUNTER — Other Ambulatory Visit: Payer: Self-pay | Admitting: Internal Medicine

## 2017-08-10 ENCOUNTER — Encounter: Payer: Self-pay | Admitting: Internal Medicine

## 2017-08-10 ENCOUNTER — Ambulatory Visit (INDEPENDENT_AMBULATORY_CARE_PROVIDER_SITE_OTHER): Payer: Medicare Other | Admitting: Internal Medicine

## 2017-08-10 VITALS — BP 126/78 | HR 65 | Temp 97.8°F | Resp 14 | Ht 64.0 in | Wt 178.1 lb

## 2017-08-10 DIAGNOSIS — F32A Depression, unspecified: Secondary | ICD-10-CM

## 2017-08-10 DIAGNOSIS — F329 Major depressive disorder, single episode, unspecified: Secondary | ICD-10-CM | POA: Diagnosis not present

## 2017-08-10 DIAGNOSIS — F419 Anxiety disorder, unspecified: Secondary | ICD-10-CM | POA: Diagnosis not present

## 2017-08-10 DIAGNOSIS — R251 Tremor, unspecified: Secondary | ICD-10-CM

## 2017-08-10 MED ORDER — CLONAZEPAM 0.25 MG PO TBDP: 0.2500 mg | ORAL_TABLET | Freq: Two times a day (BID) | ORAL | 0 refills | Status: DC | PRN

## 2017-08-10 NOTE — Progress Notes (Signed)
Pre visit review using our clinic review tool, if applicable. No additional management support is needed unless otherwise documented below in the visit note. 

## 2017-08-10 NOTE — Patient Instructions (Signed)
Continue the same medications  Add  clonazepam, you can take 1 tablet twice a day.  Watch for feeling too sleepy.  Seek counseling, if you need information about our counselors let them know  Come back in 3 months, please make an appointment

## 2017-08-10 NOTE — Progress Notes (Signed)
Subjective:    Patient ID: Andrea Martin, female    DOB: 09/26/1935, 82 y.o.   MRN: [id number]  DOS:  08/10/2017 Type of visit - description : acute Interval history: Here because tremors, she has had action tremors, gradually getting worse. Reports that is making difficult to drink a cup of coffee or witting . On further questioning, she admits to a lot of stress; anxiety and depression were previously well controlled but does not think is the case anymore. Sour of stress:  -Her husband is getting older, she is taking more responsibility in the house. -She is 89, wonders until when she is going to live.  Review of Systems Denies any suicidal ideas.  Past Medical History:  Diagnosis Date  . Anxiety and depression   . Gait disorder   . Hyperlipidemia   . Neuropathy 08/04/2013  . Shoulder dislocation    h/o after a fall    Past Surgical History:  Procedure Laterality Date  . NO PAST SURGERIES      Social History   Socioeconomic History  . Marital status: Married    Spouse name: Not on file  . Number of children: 1  . Years of education: Not on file  . Highest education level: Not on file  Occupational History  . Occupation: retired  Engineer, productionocial Needs  . Financial resource strain: Not on file  . Food insecurity:    Worry: Not on file    Inability: Not on file  . Transportation needs:    Medical: Not on file    Non-medical: Not on file  Tobacco Use  . Smoking status: Never Smoker  . Smokeless tobacco: Never Used  Substance and Sexual Activity  . Alcohol use: No  . Drug use: No  . Sexual activity: Not on file  Lifestyle  . Physical activity:    Days per week: Not on file    Minutes per session: Not on file  . Stress: Not on file  Relationships  . Social connections:    Talks on phone: Not on file    Gets together: Not on file    Attends religious service: Not on file    Active member of club or organization: Not on file    Attends meetings of clubs or  organizations: Not on file    Relationship status: Not on file  . Intimate partner violence:    Fear of current or ex partner: Not on file    Emotionally abused: Not on file    Physically abused: Not on file    Forced sexual activity: Not on file  Other Topics Concern  . Not on file  Social History Narrative   Lives w/ husband , share the house w/ daughter and g-son      Allergies as of 08/10/2017      Reactions   Codeine Nausea And Vomiting   Penicillins Rash      Medication List        Accurate as of 08/10/17 11:59 PM. Always use your most recent med list.          aspirin 81 MG tablet Take 81 mg by mouth daily.   B-6 100 MG Tabs Take 100 mg by mouth daily.   buPROPion 150 MG 12 hr tablet Commonly known as:  WELLBUTRIN SR Take 2 tablets (300 mg total) by mouth daily.   calcium carbonate 600 MG Tabs tablet Commonly known as:  OS-CAL Take 600 mg by mouth daily.  clonazePAM 0.25 MG disintegrating tablet Commonly known as:  KLONOPIN Take 1 tablet (0.25 mg total) by mouth 2 (two) times daily as needed for seizure.   escitalopram 20 MG tablet Commonly known as:  LEXAPRO Take 1 tablet (20 mg total) by mouth daily.   fish oil-omega-3 fatty acids 1000 MG capsule Take 1 g by mouth daily.   glucosamine-chondroitin 500-400 MG tablet Take 1 tablet by mouth daily.   multivitamin tablet Take 1 tablet by mouth daily.   pravastatin 20 MG tablet Commonly known as:  PRAVACHOL Take 1 tablet (20 mg total) at bedtime by mouth.   VITAMIN B 12 PO Take 1 tablet by mouth daily.   Vitamin D-3 1000 units Caps Take 1 capsule by mouth daily.          Objective:   Physical Exam BP 126/78 (BP Location: Left Arm, Patient Position: Sitting, Cuff Size: Small)   Pulse 65   Temp 97.8 F (36.6 C) (Oral)   Resp 14   Ht 5\' 4"  (1.626 m)   Wt 178 lb 2 oz (80.8 kg)   SpO2 98%   BMI 30.58 kg/m  General:   Well developed, NAD, see BMI.  HEENT:  Normocephalic . Face  symmetric, atraumatic  Neurologic:  alert & oriented X3.  Speech normal, gait appropriate for age and unassisted.  Very mild action tremor on the hands.  Neck, head: unaffected, no tremor. I asked her to writing on a piece of paper on her writing was very legible. Psych--  Cognition and judgment appear intact.  Cooperative with normal attention span and concentration.  Behavior appropriate. slt  anxious and depressed appearing.      Assessment & Plan:   Assessment   Hyperlipidemia.  Rx Pravachol 10-2016, good results Neuropathy: DX 2015, feet numbness, decreased pinprick exam,  declined NCS Tremor, likely essential ; ++ s/e w/ primidone (2018) Anxiety depression Gait d/o, falls, referred to PT 2014 Essential tremor DX 11-2015  PLAN: Anxiety, depression: Has been well controlled for years on Wellbutrin and Lexapro 20 mg.  Lately she has been more anxious, her husband is getting older, she is taking more responsibilities.  We talk about possibly change her meds  but they have worked well for so many years that we are both hesitant. At the end we agreed to try a low-dose of clonazepam twice a day, definitely seek counseling, she plans to talk with her pastor.  Knows to call the office if need to see one of our counselors instead. Tremors: Likely essential tremors, gradually getting worse, previously failed to improve with primidone, pulse is 65, I am reluctant to prescribe beta-blockers. I suspect anxiety may be playing a role on increased tremors, will try to improve anxiety control with clonazepam and counseling, reassess in few months.   RTC 3 months   Today, I spent more than 25   min with the patient: >50% of the time counseling regards anxiety, depression, discussed RX options, also benefits of counseling and listening therapy

## 2017-08-11 DIAGNOSIS — R251 Tremor, unspecified: Secondary | ICD-10-CM | POA: Insufficient documentation

## 2017-08-11 NOTE — Assessment & Plan Note (Signed)
Anxiety, depression: Has been well controlled for years on Wellbutrin and Lexapro 20 mg.  Lately she has been more anxious, her husband is getting older, she is taking more responsibilities.  We talk about possibly change her meds  but they have worked well for so many years that we are both hesitant. At the end we agreed to try a low-dose of clonazepam twice a day, definitely seek counseling, she plans to talk with her pastor.  Knows to call the office if need to see one of our counselors instead. Tremors: Likely essential tremors, gradually getting worse, previously failed to improve with primidone, pulse is 65, I am reluctant to prescribe beta-blockers. I suspect anxiety may be playing a role on increased tremors, will try to improve anxiety control with clonazepam and counseling, reassess in few months.   RTC 3 months

## 2017-09-30 ENCOUNTER — Other Ambulatory Visit: Payer: Self-pay | Admitting: Internal Medicine

## 2017-11-03 ENCOUNTER — Encounter: Payer: Self-pay | Admitting: Internal Medicine

## 2017-11-03 ENCOUNTER — Ambulatory Visit (INDEPENDENT_AMBULATORY_CARE_PROVIDER_SITE_OTHER): Payer: Medicare Other | Admitting: Internal Medicine

## 2017-11-03 VITALS — BP 132/74 | HR 57 | Temp 98.0°F | Resp 16 | Ht 64.0 in | Wt 177.8 lb

## 2017-11-03 DIAGNOSIS — Z0000 Encounter for general adult medical examination without abnormal findings: Secondary | ICD-10-CM | POA: Diagnosis not present

## 2017-11-03 DIAGNOSIS — Z780 Asymptomatic menopausal state: Secondary | ICD-10-CM

## 2017-11-03 LAB — CBC WITH DIFFERENTIAL/PLATELET
BASOS ABS: 0 10*3/uL (ref 0.0–0.1)
BASOS PCT: 0.6 % (ref 0.0–3.0)
EOS ABS: 0.2 10*3/uL (ref 0.0–0.7)
Eosinophils Relative: 3.3 % (ref 0.0–5.0)
HEMATOCRIT: 40.2 % (ref 36.0–46.0)
HEMOGLOBIN: 13.6 g/dL (ref 12.0–15.0)
LYMPHS PCT: 37.5 % (ref 12.0–46.0)
Lymphs Abs: 1.8 10*3/uL (ref 0.7–4.0)
MCHC: 33.8 g/dL (ref 30.0–36.0)
MCV: 93.8 fl (ref 78.0–100.0)
MONOS PCT: 9.2 % (ref 3.0–12.0)
Monocytes Absolute: 0.4 10*3/uL (ref 0.1–1.0)
NEUTROS ABS: 2.3 10*3/uL (ref 1.4–7.7)
Neutrophils Relative %: 49.4 % (ref 43.0–77.0)
Platelets: 250 10*3/uL (ref 150.0–400.0)
RBC: 4.28 Mil/uL (ref 3.87–5.11)
RDW: 13.1 % (ref 11.5–15.5)
WBC: 4.7 10*3/uL (ref 4.0–10.5)

## 2017-11-03 LAB — LIPID PANEL
CHOL/HDL RATIO: 4
Cholesterol: 208 mg/dL — ABNORMAL HIGH (ref 0–200)
HDL: 49 mg/dL (ref >39.00)
LDL CALC: 120 mg/dL — AB (ref 0–99)
NonHDL: 158.75
Triglycerides: 195 mg/dL — ABNORMAL HIGH (ref 0.0–149.0)
VLDL: 39 mg/dL (ref 0.0–40.0)

## 2017-11-03 LAB — COMPREHENSIVE METABOLIC PANEL
ALK PHOS: 70 U/L (ref 39–117)
ALT: 23 U/L (ref 0–35)
AST: 21 U/L (ref 0–37)
Albumin: 3.8 g/dL (ref 3.5–5.2)
BUN: 16 mg/dL (ref 6–23)
CHLORIDE: 106 meq/L (ref 96–112)
CO2: 32 mEq/L (ref 19–32)
Calcium: 9.7 mg/dL (ref 8.4–10.5)
Creatinine, Ser: 0.94 mg/dL (ref 0.40–1.20)
GFR: 60.79 mL/min (ref >60.00)
GLUCOSE: 97 mg/dL (ref 70–99)
Potassium: 4.2 mEq/L (ref 3.5–5.1)
SODIUM: 142 meq/L (ref 135–145)
TOTAL PROTEIN: 6.3 g/dL (ref 6.0–8.3)
Total Bilirubin: 0.5 mg/dL (ref 0.2–1.2)

## 2017-11-03 MED ORDER — ZOSTER VAC RECOMB ADJUVANTED 50 MCG/0.5ML IM SUSR: 0.5000 mL | Freq: Once | INTRAMUSCULAR | 1 refills | Status: AC

## 2017-11-03 NOTE — Assessment & Plan Note (Addendum)
-  Td 2013 ; Pneumonia shot 2011; prevnar 2015; Zostavax 2008 ; shingrix discussed, Rx provided. -[facility] No further PAP, see prevous entries  MMG: 11-2016 wnl -CCS: History of colonoscopies in 2004, 2007 , 2012,  2017: no polyps, f/u prn, Dr. Ewing Schlein -DEXA 07-2012 (-), on calcium and vitamin D, check DEXA  -Diet-exercise discussed  -Labs:  CMP, FLP, CBC - EKG : sinus brady

## 2017-11-03 NOTE — Progress Notes (Signed)
Subjective:    Patient ID: Andrea Martin, female    DOB: 1935/02/25, 82 y.o.   MRN: [id number]  DOS:  11/03/2017 Type of visit - description : cpx Interval history: Since the last office visit she is doing well.  Using clonazepam sporadically. Tremors continue, symptom increase with stress  Review of Systems   Other than above, a 14 point review of systems is negative    Past Medical History:  Diagnosis Date  . Anxiety and depression   . Gait disorder   . Hyperlipidemia   . Neuropathy 08/04/2013  . Shoulder dislocation    h/o after a fall    Past Surgical History:  Procedure Laterality Date  . NO PAST SURGERIES      Social History   Socioeconomic History  . Marital status: Married    Spouse name: Not on file  . Number of children: 1  . Years of education: Not on file  . Highest education level: Not on file  Occupational History  . Occupation: retired  Engineer, production  . Financial resource strain: Not on file  . Food insecurity:    Worry: Not on file    Inability: Not on file  . Transportation needs:    Medical: Not on file    Non-medical: Not on file  Tobacco Use  . Smoking status: Never Smoker  . Smokeless tobacco: Never Used  Substance and Sexual Activity  . Alcohol use: No  . Drug use: No  . Sexual activity: Not on file  Lifestyle  . Physical activity:    Days per week: Not on file    Minutes per session: Not on file  . Stress: Not on file  Relationships  . Social connections:    Talks on phone: Not on file    Gets together: Not on file    Attends religious service: Not on file    Active member of club or organization: Not on file    Attends meetings of clubs or organizations: Not on file    Relationship status: Not on file  . Intimate partner violence:    Fear of current or ex partner: Not on file    Emotionally abused: Not on file    Physically abused: Not on file    Forced sexual activity: Not on file  Other Topics Concern  . Not on file    Social History Narrative   Lives w/ husband , share the house w/ daughter and g-son     Family History  Problem Relation Age of Onset  . Heart disease Father        CAD?  Marland Kitchen Coronary artery disease Brother 37  . Heart disease Mother   . Breast cancer Neg Hx   . Colon cancer Neg Hx   . Diabetes Neg Hx   . Stroke Neg Hx      Allergies as of 11/03/2017      Reactions   Codeine Nausea And Vomiting   Penicillins Rash      Medication List        Accurate as of 11/03/17 11:59 PM. Always use your most recent med list.          B-6 100 MG Tabs Take 100 mg by mouth daily.   buPROPion 150 MG 12 hr tablet Commonly known as:  WELLBUTRIN SR Take 2 tablets (300 mg total) by mouth daily.   calcium carbonate 600 MG Tabs tablet Commonly known as:  OS-CAL Take 600 mg by  mouth daily.   clonazePAM 0.25 MG disintegrating tablet Commonly known as:  KLONOPIN Take 1 tablet (0.25 mg total) by mouth 2 (two) times daily as needed for seizure.   escitalopram 20 MG tablet Commonly known as:  LEXAPRO Take 1 tablet (20 mg total) by mouth daily.   fish oil-omega-3 fatty acids 1000 MG capsule Take 1 g by mouth daily.   glucosamine-chondroitin 500-400 MG tablet Take 1 tablet by mouth daily.   multivitamin tablet Take 1 tablet by mouth daily.   pravastatin 20 MG tablet Commonly known as:  PRAVACHOL Take 1 tablet (20 mg total) by mouth at bedtime.   VITAMIN B 12 PO Take 1 tablet by mouth daily.   Vitamin D-3 1000 units Caps Take 1 capsule by mouth daily.   Zoster Vaccine Adjuvanted injection Commonly known as:  SHINGRIX Inject 0.5 mLs into the muscle once for 1 dose.          Objective:   Physical Exam BP 132/74 (BP Location: Left Arm, Patient Position: Sitting, Cuff Size: Small)   Pulse (!) 57   Temp 98 F (36.7 C) (Oral)   Resp 16   Ht 5\' 4"  (1.626 m)   Wt 177 lb 12.8 oz (80.6 kg)   SpO2 94%   BMI 30.52 kg/m  General: Well developed, NAD, see BMI.  Neck: No   thyromegaly  HEENT:  Normocephalic . Face symmetric, atraumatic Lungs:  CTA B Normal respiratory effort, no intercostal retractions, no accessory muscle use. Heart: RRR,  no murmur.  No pretibial edema bilaterally  Abdomen:  Not distended, soft, non-tender. No rebound or rigidity.   Skin: Exposed areas without rash. Not pale. Not jaundice Neurologic:  alert & oriented X3.  Speech normal, gait appropriate for age and unassisted.  No obvious tremors today. Strength symmetric and appropriate for age.  Psych: Cognition and judgment appear intact.  Cooperative with normal attention span and concentration.  Behavior appropriate. No anxious or depressed appearing.     Assessment & Plan:   Assessment   Hyperlipidemia.  Rx Pravachol 10-2016, good results Neuropathy: DX 2015, feet numbness, decreased pinprick exam,  declined NCS Tremor, likely essential ; dx 11/2015 ++ s/e w/ primidone (2018) Anxiety depression Gait d/o, falls, referred to PT 2014   PLAN: Hyperlipidemia: Checking labs, continue Pravachol. Tremors: Only becomes a issue when she is very anxious.  We agreed on observation for now.  See next Anxiety depression: Since the last office visit she continue with Lexapro and Wellbutrin, we added clonazepam, she only takes it sporadically when she is really anxious or "frustrated". Regimen  works well for her.  She gets somewhat sleepy when she takes a whole clonazepam tablet.  Recommend to try even half tablet as needed. Gait disorder: No recent falls but istill an issue, previously self PT/exercises help a lot, she does have the information at home, recommend to go back to routine exercises and stretching. RTC 6 months

## 2017-11-03 NOTE — Patient Instructions (Signed)
GO TO THE LAB : Get the blood work     GO TO THE FRONT DESK Schedule your next appointment for a checkup in 6 months  Continue same medications, okay to take only half clonazepam as needed  We will get a bone density test  Go back to a exercise routine and physical therapy exercises gradually

## 2017-11-04 NOTE — Assessment & Plan Note (Signed)
Hyperlipidemia: Checking labs, continue Pravachol. Tremors: Only becomes a issue when she is very anxious.  We agreed on observation for now.  See next Anxiety depression: Since the last office visit she continue with Lexapro and Wellbutrin, we added clonazepam, she only takes it sporadically when she is really anxious or "frustrated". Regimen  works well for her.  She gets somewhat sleepy when she takes a whole clonazepam tablet.  Recommend to try even half tablet as needed. Gait disorder: No recent falls but istill an issue, previously self PT/exercises help a lot, she does have the information at home, recommend to go back to routine exercises and stretching. RTC 6 months

## 2017-12-05 ENCOUNTER — Other Ambulatory Visit: Payer: Self-pay

## 2017-12-05 ENCOUNTER — Emergency Department (INDEPENDENT_AMBULATORY_CARE_PROVIDER_SITE_OTHER)
Admission: EM | Admit: 2017-12-05 | Discharge: 2017-12-05 | Disposition: A | Discharge status: Home / Self Care | Payer: Medicare Other | Source: Home / Self Care | Attending: Emergency Medicine | Admitting: Emergency Medicine

## 2017-12-05 DIAGNOSIS — H65193 Other acute nonsuppurative otitis media, bilateral: Secondary | ICD-10-CM | POA: Diagnosis not present

## 2017-12-05 DIAGNOSIS — J029 Acute pharyngitis, unspecified: Secondary | ICD-10-CM | POA: Diagnosis not present

## 2017-12-05 LAB — POCT INFLUENZA A/B
Influenza A, POC: NEGATIVE
Influenza B, POC: NEGATIVE

## 2017-12-05 LAB — POCT RAPID STREP A (OFFICE): Rapid Strep A Screen: NEGATIVE

## 2017-12-05 MED ORDER — BENZONATATE 100 MG PO CAPS: 100.0000 mg | ORAL_CAPSULE | Freq: Three times a day (TID) | ORAL | 0 refills | Status: DC

## 2017-12-05 MED ORDER — AZITHROMYCIN 250 MG PO TABS: ORAL_TABLET | ORAL | 0 refills | Status: DC

## 2017-12-05 NOTE — ED Provider Notes (Signed)
[facility]    CSN: [id number] Arrival date & time: 12/05/17  1132     History   Chief Complaint Chief Complaint  Patient presents with  . Nasal Congestion  . Otalgia    both ears    HPI Andrea Martin is a 82 y.o. female.  Patient had onset of symptoms on Friday. She has had head congestion with pain in both ears. She has had a bad cough which has been nonproductive. She has not received her flu shot yet this year. She denies fever with this illness and has had no known exposure to flu. She has not been to the fair in [place]. HPI  Past Medical History:  Diagnosis Date  . Anxiety and depression   . Gait disorder   . Hyperlipidemia   . Neuropathy 08/04/2013  . Shoulder dislocation    h/o after a fall    Patient Active Problem List   Diagnosis Date Noted  . Tremor 08/11/2017  . Essential tremor 11/29/2015  . PCP NOTES >>>>>>>>>>>>>>>>>>>>>>>> 06/04/2015  . Neuropathy 08/04/2013  . Annual physical exam 08/01/2012  . Hyperlipidemia 11/03/2011  . Anxiety and depression 11/03/2011  . Gait disorder 11/03/2011    Past Surgical History:  Procedure Laterality Date  . NO PAST SURGERIES      OB History   None      Home Medications    Prior to Admission medications   Medication Sig Start Date End Date Taking? Authorizing Provider  azithromycin (ZITHROMAX) 250 MG tablet Take 2 today then one a day for 4 days. 12/05/17   Collene Gobble, MD  benzonatate (TESSALON) 100 MG capsule Take 1 capsule (100 mg total) by mouth every 8 (eight) hours. 12/05/17   Collene Gobble, MD  buPROPion (WELLBUTRIN SR) 150 MG 12 hr tablet Take 2 tablets (300 mg total) by mouth daily. 06/30/17   Wanda Plump, MD  calcium carbonate (OS-CAL) 600 MG TABS Take 600 mg by mouth daily.    [provider]  Cholecalciferol (VITAMIN D-3) 1000 UNITS CAPS Take 1 capsule by mouth daily.    [provider]  clonazePAM (KLONOPIN) 0.25 MG disintegrating tablet Take 1 tablet  (0.25 mg total) by mouth 2 (two) times daily as needed for seizure. 08/10/17   Wanda Plump, MD  Cyanocobalamin (VITAMIN B 12 PO) Take 1 tablet by mouth daily.    [provider]  escitalopram (LEXAPRO) 20 MG tablet Take 1 tablet (20 mg total) by mouth daily. 09/30/17   Wanda Plump, MD  fish oil-omega-3 fatty acids 1000 MG capsule Take 1 g by mouth daily.    [provider]  glucosamine-chondroitin 500-400 MG tablet Take 1 tablet by mouth daily.    [provider]  Multiple Vitamin (MULTIVITAMIN) tablet Take 1 tablet by mouth daily.    [provider]  pravastatin (PRAVACHOL) 20 MG tablet Take 1 tablet (20 mg total) by mouth at bedtime. 09/30/17   Wanda Plump, MD  Pyridoxine HCl (B-6) 100 MG TABS Take 100 mg by mouth daily.    [provider]    Family History Family History  Problem Relation Age of Onset  . Heart disease Father        CAD?  Marland Kitchen Coronary artery disease Brother 57  . Heart disease Mother   . Breast cancer Neg Hx   . Colon cancer Neg Hx   . Diabetes Neg Hx   . Stroke Neg Hx  Social History Social History   Tobacco Use  . Smoking status: Never Smoker  . Smokeless tobacco: Never Used  Substance Use Topics  . Alcohol use: No  . Drug use: No     Allergies   Codeine; Sulfa antibiotics; and Penicillins   Review of Systems Review of Systems  Constitutional: Positive for fatigue. Negative for fever.  HENT: Positive for ear pain and sore throat.   Eyes: Negative.   Respiratory: Positive for cough.   Cardiovascular: Negative.   Gastrointestinal: Negative.      Physical Exam Triage Vital Signs ED Triage Vitals [12/05/17 1234]  Enc Vitals Group     BP (!) 165/113     Pulse Rate 77     Resp      Temp 98.4 F (36.9 C)     Temp Source Oral     SpO2 97 %     Weight 178 lb (80.7 kg)     Height 5\' 5"  (1.651 m)     Head Circumference      Peak Flow      Pain Score 0     Pain Loc      Pain Edu?      Excl. in GC?      No data found.  Updated Vital Signs BP (!) 185/87 (BP Location: Left Arm)   Pulse 77   Temp 98.4 F (36.9 C) (Oral)   Ht 5\' 5"  (1.651 m)   Wt 80.7 kg   SpO2 97%   BMI 29.62 kg/m   Visual Acuity Right Eye Distance:   Left Eye Distance:   Bilateral Distance:    Right Eye Near:   Left Eye Near:    Bilateral Near:     Physical Exam  Constitutional: She appears well-developed and well-nourished.  HENT:  He right TM is red and dull The left TM is markedly injected. Throat is slightly red.There is no cervical adenopathy.  Eyes: Pupils are equal, round, and reactive to light. Conjunctivae are normal.  Neck: Normal range of motion.  Cardiovascular: Normal rate and regular rhythm.  Pulmonary/Chest: Effort normal and breath sounds normal.  Abdominal: Soft.     UC Treatments / Results  Labs (all labs ordered are listed, but only abnormal results are displayed) Labs Reviewed  STREP A DNA PROBE  POCT INFLUENZA A/B  POCT RAPID STREP A (OFFICE)    EKG None  Radiology No results found.  Procedures Procedures (including critical care time)  Medications Ordered in UC Medications - No data to display  Initial Impression / Assessment and Plan / UC Course  I have reviewed the triage vital signs and the nursing notes.  Pertinent labs & imaging results that were available during my care of the patient were reviewed by me and considered in my medical decision making (see chart for details). Patient has a bilateral otitis media. She is allergic to sulfa and penicillin.Will treat with a Z-Pak.  . She was advised to use saline nasal spray and given Tessalon Perles for cough.     Final Clinical Impressions(s) / UC Diagnoses   Final diagnoses:  Other acute nonsuppurative otitis media of both ears, recurrence not specified     Discharge Instructions     Take antibiotics as instructed.. Use Tessalon Perles for cough. Follow-up with your primary care physician if  worsening symptoms.     ED Prescriptions    Medication Sig Dispense Auth. Provider   azithromycin (ZITHROMAX) 250 MG tablet Take 2 today then  one a day for 4 days. 6 tablet Collene Gobble, MD   benzonatate (TESSALON) 100 MG capsule Take 1 capsule (100 mg total) by mouth every 8 (eight) hours. 21 capsule Collene Gobble, MD     Controlled Substance Prescriptions [place] Controlled Substance Registry consulted? Not Applicable   Collene Gobble, MD 12/05/17 1451

## 2017-12-05 NOTE — Discharge Instructions (Addendum)
Take antibiotics as instructed.. Use Tessalon Perles for cough. Follow-up with your primary care physician if worsening symptoms.

## 2017-12-05 NOTE — ED Triage Notes (Signed)
Pt c/o congestion, pain in both ears and a deep chest cough. Taking ibuprofen cold and sinus prn.

## 2017-12-06 ENCOUNTER — Telehealth: Payer: Self-pay | Admitting: Emergency Medicine

## 2017-12-06 LAB — STREP A DNA PROBE: Group A Strep Probe: NOT DETECTED

## 2017-12-14 ENCOUNTER — Ambulatory Visit: Payer: Self-pay

## 2017-12-14 NOTE — Telephone Encounter (Signed)
Patient called in with c/o "cough, sinus congestion." She says "about 10 days ago I started with the cough, sinus congestion. I went to an UC in [place] a week from last Sunday and was diagnosed with ear infection to both ears, given antibiotics. My ears are better, but I still have the sinus congestion and cough. When I cough, green phlegm comes up and my chest feels full of congestion, it feels tight. I don't feel like I can get a good breath." I asked about fever, she denies. I asked about other symptoms, she denies chest pain, wheezing. According to protocol, see PCP within 3 days, appointment scheduled for tomorrow, 12/15/17 at 1440, care advice given, patient verbalized understanding.  Reason for Disposition . [1] Nasal discharge AND [2] present > 10 days  Answer Assessment - Initial Assessment Questions 1. ONSET: "When did the cough begin?"      About 10 days or so 2. SEVERITY: "How bad is the cough today?"      Coughing every few minutes 3. RESPIRATORY DISTRESS: "Describe your breathing."      Breathing is fine, but feel full in my chest, can't get a good breath 4. FEVER: "Do you have a fever?" If so, ask: "What is your temperature, how was it measured, and when did it start?"     No 5. SPUTUM: "Describe the color of your sputum" (clear, white, yellow, green)     Green, thick 6. HEMOPTYSIS: "Are you coughing up any blood?" If so ask: "How much?" (flecks, streaks, tablespoons, etc.)     No 7. CARDIAC HISTORY: "Do you have any history of heart disease?" (e.g., heart attack, congestive heart failure)      No 8. LUNG HISTORY: "Do you have any history of lung disease?"  (e.g., pulmonary embolus, asthma, emphysema)     No 9. PE RISK FACTORS: "Do you have a history of blood clots?" (or: recent major surgery, recent prolonged travel, bedridden)     No 10. OTHER SYMPTOMS: "Do you have any other symptoms?" (e.g., runny nose, wheezing, chest pain)      Sinus congestion, chest tightness  when cough 11. PREGNANCY: "Is there any chance you are pregnant?" "When was your last menstrual period?"       No 12. TRAVEL: "Have you traveled out of the country in the last month?" (e.g., travel history, exposures)       No  Protocols used: COUGH - ACUTE PRODUCTIVE-A-AH

## 2017-12-15 ENCOUNTER — Encounter: Payer: Self-pay | Admitting: Internal Medicine

## 2017-12-15 ENCOUNTER — Ambulatory Visit (INDEPENDENT_AMBULATORY_CARE_PROVIDER_SITE_OTHER): Payer: Medicare Other | Admitting: Internal Medicine

## 2017-12-15 VITALS — BP 128/80 | HR 68 | Temp 97.9°F | Resp 16 | Ht 64.0 in | Wt 178.4 lb

## 2017-12-15 DIAGNOSIS — J069 Acute upper respiratory infection, unspecified: Secondary | ICD-10-CM

## 2017-12-15 DIAGNOSIS — J40 Bronchitis, not specified as acute or chronic: Secondary | ICD-10-CM

## 2017-12-15 DIAGNOSIS — Z780 Asymptomatic menopausal state: Secondary | ICD-10-CM

## 2017-12-15 MED ORDER — CLONAZEPAM 0.25 MG PO TBDP: 0.2500 mg | ORAL_TABLET | Freq: Two times a day (BID) | ORAL | 0 refills | Status: DC | PRN

## 2017-12-15 MED ORDER — AZELASTINE HCL 0.1 % NA SOLN: 2.0000 | Freq: Every evening | NASAL | 3 refills | Status: DC | PRN

## 2017-12-15 MED ORDER — HYDROCODONE-HOMATROPINE 5-1.5 MG/5ML PO SYRP: 5.0000 mL | ORAL_SOLUTION | Freq: Every evening | ORAL | 0 refills | Status: DC | PRN

## 2017-12-15 NOTE — Patient Instructions (Signed)
Rest, fluids , tylenol  For cough:  Take Mucinex DM twice a day as needed until better For severe persistent cough: Take hydrocodone.  Watch for side effects such as nausea.  For nasal congestion: Use OTC  Flonase : 2 nasal sprays on each side of the nose in the morning until you feel better Use ASTELIN a prescribed spray : 2 nasal sprays on each side of the nose at night until you feel better  Avoid decongestants such as  Pseudoephedrine or phenylephrine    Call if not gradually better over the next  10 days  Call anytime if the symptoms are severe

## 2017-12-15 NOTE — Progress Notes (Signed)
Pre visit review using our clinic review tool, if applicable. No additional management support is needed unless otherwise documented below in the visit note. 

## 2017-12-15 NOTE — Progress Notes (Signed)
Subjective:    Patient ID: Andrea Martin, female    DOB: 05-15-1935, 82 y.o.   MRN: [id number]  DOS:  12/15/2017 Type of visit - description : acute Interval history: Symptoms of started approximately 12/03/2017, went to urgent care 12/05/2017, at that time she had sinus congestion, bilateral ear pain, cough (dry). Influenza and strep test were negative. Was prescribed a Z-Pak and benzonatate, had diarrhea after after the 4th  dose of Zithromax.  Review of Systems She is here because she continue with cough, usually dry but this morning saw green sputum.  No hemoptysis Continue with nasal congestion, nasal discharge is clear. No wheezing, shortness of breath or chest pain No fever chills No nausea or vomiting  Past Medical History:  Diagnosis Date  . Anxiety and depression   . Gait disorder   . Hyperlipidemia   . Neuropathy 08/04/2013  . Shoulder dislocation    h/o after a fall    Past Surgical History:  Procedure Laterality Date  . NO PAST SURGERIES      Social History   Socioeconomic History  . Marital status: Married    Spouse name: Not on file  . Number of children: 1  . Years of education: Not on file  . Highest education level: Not on file  Occupational History  . Occupation: retired  Engineer, production  . Financial resource strain: Not on file  . Food insecurity:    Worry: Not on file    Inability: Not on file  . Transportation needs:    Medical: Not on file    Non-medical: Not on file  Tobacco Use  . Smoking status: Never Smoker  . Smokeless tobacco: Never Used  Substance and Sexual Activity  . Alcohol use: No  . Drug use: No  . Sexual activity: Not on file  Lifestyle  . Physical activity:    Days per week: Not on file    Minutes per session: Not on file  . Stress: Not on file  Relationships  . Social connections:    Talks on phone: Not on file    Gets together: Not on file    Attends religious service: Not on file    Active member of club or  organization: Not on file    Attends meetings of clubs or organizations: Not on file    Relationship status: Not on file  . Intimate partner violence:    Fear of current or ex partner: Not on file    Emotionally abused: Not on file    Physically abused: Not on file    Forced sexual activity: Not on file  Other Topics Concern  . Not on file  Social History Narrative   Lives w/ husband , share the house w/ daughter and g-son      Allergies as of 12/15/2017      Reactions   Codeine Nausea And Vomiting   Sulfa Antibiotics    Was told allergic to sulfa does not remember why   Penicillins Rash      Medication List        Accurate as of 12/15/17 11:59 PM. Always use your most recent med list.          azelastine 0.1 % nasal spray Commonly known as:  ASTELIN Place 2 sprays into both nostrils at bedtime as needed for rhinitis. Use in each nostril as directed   B-6 100 MG Tabs Take 100 mg by mouth daily.   buPROPion 150 MG 12  hr tablet Commonly known as:  WELLBUTRIN SR Take 2 tablets (300 mg total) by mouth daily.   calcium carbonate 600 MG Tabs tablet Commonly known as:  OS-CAL Take 600 mg by mouth daily.   clonazePAM 0.25 MG disintegrating tablet Commonly known as:  KLONOPIN Take 1 tablet (0.25 mg total) by mouth 2 (two) times daily as needed for seizure.   escitalopram 20 MG tablet Commonly known as:  LEXAPRO Take 1 tablet (20 mg total) by mouth daily.   fish oil-omega-3 fatty acids 1000 MG capsule Take 1 g by mouth daily.   glucosamine-chondroitin 500-400 MG tablet Take 1 tablet by mouth daily.   HYDROcodone-homatropine 5-1.5 MG/5ML syrup Commonly known as:  HYCODAN Take 5 mLs by mouth at bedtime as needed for cough.   multivitamin tablet Take 1 tablet by mouth daily.   pravastatin 20 MG tablet Commonly known as:  PRAVACHOL Take 1 tablet (20 mg total) by mouth at bedtime.   VITAMIN B 12 PO Take 1 tablet by mouth daily.   Vitamin D-3 1000 units  Caps Take 1 capsule by mouth daily.          Objective:   Physical Exam BP 128/80 (BP Location: Left Arm, Patient Position: Sitting, Cuff Size: Normal)   Pulse 68   Temp 97.9 F (36.6 C) (Oral)   Resp 16   Ht 5\' 4"  (1.626 m)   Wt 178 lb 6 oz (80.9 kg)   SpO2 97%   BMI 30.62 kg/m  General:   Well developed, NAD, see BMI.  HEENT:  Normocephalic . Face symmetric, atraumatic. TMs are slightly bulge, nose congested, sinuses no TTP.  Throat symmetric Lungs:  Very few rhonchi otherwise normal Normal respiratory effort, no intercostal retractions, no accessory muscle use. Heart: RRR,  no murmur.  No pretibial edema bilaterally  Skin: Not pale. Not jaundice Neurologic:  alert & oriented X3.  Speech normal, gait appropriate for age and unassisted Psych--  Cognition and judgment appear intact.  Cooperative with normal attention span and concentration.  Behavior appropriate. No anxious or depressed appearing.      Assessment & Plan:    Assessment   Hyperlipidemia.  Rx Pravachol 10-2016, good results Neuropathy: DX 2015, feet numbness, decreased pinprick exam,  declined NCS Tremor, likely essential ; dx 11/2015 ++ s/e w/ primidone (2018) Anxiety depression Gait d/o, falls, referred to PT 2014   PLAN: URI/bronchitis: She had a total of 4 doses of Zithromax, don't  think she needs more antibiotics. Recommend symptom control with Mucinex, Flonase.  We will also add Astelin and hydrocodone noting that she had nausea with codeine but no true allergic reaction such as rash or itching.  Hopefully she will tolerate hydrocodone, watch for drowsiness.  See AVS.

## 2017-12-16 NOTE — Assessment & Plan Note (Signed)
URI/bronchitis: She had a total of 4 doses of Zithromax, don't  think she needs more antibiotics. Recommend symptom control with Mucinex, Flonase.  We will also add Astelin and hydrocodone noting that she had nausea with codeine but no true allergic reaction such as rash or itching.  Hopefully she will tolerate hydrocodone, watch for drowsiness.  See AVS.

## 2017-12-20 DIAGNOSIS — H52203 Unspecified astigmatism, bilateral: Secondary | ICD-10-CM | POA: Diagnosis not present

## 2017-12-20 DIAGNOSIS — H5203 Hypermetropia, bilateral: Secondary | ICD-10-CM | POA: Diagnosis not present

## 2017-12-20 DIAGNOSIS — H25813 Combined forms of age-related cataract, bilateral: Secondary | ICD-10-CM | POA: Diagnosis not present

## 2017-12-24 ENCOUNTER — Other Ambulatory Visit: Payer: Self-pay

## 2017-12-24 NOTE — Patient Outreach (Signed)
[facility]) Care Management  12/24/2017  BENNETTE HASTY 22-Mar-1935 [id number]   Medication Adherence call to Mrs. Macy Mis patient did not answer patient is due on Pravastatin 20 mg. Mrs. Rance is showing past due under [facility] Ins.   Lillia Abed CPhT Pharmacy Technician [facility] Management Direct Dial [phone]  Fax [phone] [email or web address]

## 2017-12-29 ENCOUNTER — Ambulatory Visit: Payer: Medicare Other

## 2017-12-29 DIAGNOSIS — Z23 Encounter for immunization: Secondary | ICD-10-CM

## 2017-12-29 NOTE — Progress Notes (Signed)
High

## 2017-12-29 NOTE — Addendum Note (Signed)
Addended by: Lurline Hare on: 12/29/2017 04:33 PM   Modules accepted: Orders

## 2018-01-27 DIAGNOSIS — H2511 Age-related nuclear cataract, right eye: Secondary | ICD-10-CM | POA: Diagnosis not present

## 2018-01-27 DIAGNOSIS — H25811 Combined forms of age-related cataract, right eye: Secondary | ICD-10-CM | POA: Diagnosis not present

## 2018-02-10 DIAGNOSIS — H2512 Age-related nuclear cataract, left eye: Secondary | ICD-10-CM | POA: Diagnosis not present

## 2018-02-10 DIAGNOSIS — H25812 Combined forms of age-related cataract, left eye: Secondary | ICD-10-CM | POA: Diagnosis not present

## 2018-03-20 ENCOUNTER — Other Ambulatory Visit: Payer: Self-pay | Admitting: Internal Medicine

## 2018-05-04 ENCOUNTER — Other Ambulatory Visit: Payer: Self-pay

## 2018-05-04 ENCOUNTER — Encounter: Payer: Self-pay | Admitting: Internal Medicine

## 2018-05-04 ENCOUNTER — Ambulatory Visit (INDEPENDENT_AMBULATORY_CARE_PROVIDER_SITE_OTHER): Payer: Medicare Other | Admitting: Internal Medicine

## 2018-05-04 VITALS — BP 122/74 | HR 60 | Temp 98.1°F | Resp 16 | Ht 64.0 in | Wt 178.4 lb

## 2018-05-04 DIAGNOSIS — Z23 Encounter for immunization: Secondary | ICD-10-CM | POA: Diagnosis not present

## 2018-05-04 DIAGNOSIS — F329 Major depressive disorder, single episode, unspecified: Secondary | ICD-10-CM

## 2018-05-04 DIAGNOSIS — E785 Hyperlipidemia, unspecified: Secondary | ICD-10-CM | POA: Diagnosis not present

## 2018-05-04 DIAGNOSIS — F419 Anxiety disorder, unspecified: Secondary | ICD-10-CM | POA: Diagnosis not present

## 2018-05-04 DIAGNOSIS — G250 Essential tremor: Secondary | ICD-10-CM | POA: Diagnosis not present

## 2018-05-04 DIAGNOSIS — F32A Depression, unspecified: Secondary | ICD-10-CM

## 2018-05-04 NOTE — Patient Instructions (Addendum)
Please schedule Medicare Wellness with Lawanna Kobus.     GO TO THE FRONT DESK Schedule your next appointment for a physical exam in 6 months   Schedule labs to be done

## 2018-05-04 NOTE — Assessment & Plan Note (Signed)
Hyperlipidemia: Good control on Pravachol Tremor, likely essential.  Currently on no medications.  Currently symptoms are not bothersome Anxiety, depression: Continue Lexapro and Wellbutrin, symptoms controlled, her husband was recently diagnosed with dementia, we discussed the issue. Last year was prescribed clonazepam in an effort to decrease anxiety and consequently tremors but she was too sleepy and she is currently doing well. Preventive care: PNM 23 today, encouraged to get her Shingrix immunization at the pharmacy RTC 6 months CPX

## 2018-05-04 NOTE — Progress Notes (Signed)
Subjective:    Patient ID: Andrea Martin, female    DOB: 25-Jul-1935, 83 y.o.   MRN: [id number]  DOS:  05/04/2018 Type of visit - description: ROV In general feeling well.  Good med compliance. She stopped taking clonazepam, make her too sleepy. Anxiety depression: Well-controlled, she does have some challenges at home with a husband being diagnosed with dementia recently.  He is my patient.   Review of Systems Denies chest pain or difficulty breathing No lower extremity edema  Past Medical History:  Diagnosis Date  . Anxiety and depression   . Gait disorder   . Hyperlipidemia   . Neuropathy 08/04/2013  . Shoulder dislocation    h/o after a fall    Past Surgical History:  Procedure Laterality Date  . NO PAST SURGERIES      Social History   Socioeconomic History  . Marital status: Married    Spouse name: Not on file  . Number of children: 1  . Years of education: Not on file  . Highest education level: Not on file  Occupational History  . Occupation: retired  Engineer, production  . Financial resource strain: Not on file  . Food insecurity:    Worry: Not on file    Inability: Not on file  . Transportation needs:    Medical: Not on file    Non-medical: Not on file  Tobacco Use  . Smoking status: Never Smoker  . Smokeless tobacco: Never Used  Substance and Sexual Activity  . Alcohol use: No  . Drug use: No  . Sexual activity: Not on file  Lifestyle  . Physical activity:    Days per week: Not on file    Minutes per session: Not on file  . Stress: Not on file  Relationships  . Social connections:    Talks on phone: Not on file    Gets together: Not on file    Attends religious service: Not on file    Active member of club or organization: Not on file    Attends meetings of clubs or organizations: Not on file    Relationship status: Not on file  . Intimate partner violence:    Fear of current or ex partner: Not on file    Emotionally abused: Not on file   Physically abused: Not on file    Forced sexual activity: Not on file  Other Topics Concern  . Not on file  Social History Narrative   Lives w/ husband , share the house w/ daughter and g-son      Allergies as of 05/04/2018      Reactions   Codeine Nausea And Vomiting   Sulfa Antibiotics    Was told allergic to sulfa does not remember why   Penicillins Rash      Medication List       Accurate as of May 04, 2018  9:40 AM. Always use your most recent med list.        azelastine 0.1 % nasal spray Commonly known as:  ASTELIN Place 2 sprays into both nostrils at bedtime as needed for rhinitis. Use in each nostril as directed   B-6 100 MG Tabs Take 100 mg by mouth daily.   buPROPion 150 MG 12 hr tablet Commonly known as:  WELLBUTRIN SR Take 2 tablets (300 mg total) by mouth daily.   calcium carbonate 600 MG Tabs tablet Commonly known as:  OS-CAL Take 600 mg by mouth daily.   clonazePAM 0.25 MG  disintegrating tablet Commonly known as:  KLONOPIN Take 1 tablet (0.25 mg total) by mouth 2 (two) times daily as needed for seizure.   escitalopram 20 MG tablet Commonly known as:  LEXAPRO Take 1 tablet (20 mg total) by mouth daily.   fish oil-omega-3 fatty acids 1000 MG capsule Take 1 g by mouth daily.   glucosamine-chondroitin 500-400 MG tablet Take 1 tablet by mouth daily.   HYDROcodone-homatropine 5-1.5 MG/5ML syrup Commonly known as:  HYCODAN Take 5 mLs by mouth at bedtime as needed for cough.   multivitamin tablet Take 1 tablet by mouth daily.   pravastatin 20 MG tablet Commonly known as:  PRAVACHOL Take 1 tablet (20 mg total) by mouth at bedtime.   VITAMIN B 12 PO Take 1 tablet by mouth daily.   Vitamin D-3 25 MCG (1000 UT) Caps Take 1 capsule by mouth daily.           Objective:   Physical Exam BP 122/74 (BP Location: Left Arm, Patient Position: Sitting, Cuff Size: Small)   Pulse 60   Temp 98.1 F (36.7 C) (Oral)   Resp 16   Ht 5\' 4"  (1.626 m)    Wt 178 lb 6 oz (80.9 kg)   SpO2 97%   BMI 30.62 kg/m  General:   Well developed, NAD, BMI noted. HEENT:  Normocephalic . Face symmetric, atraumatic Lungs:  CTA B Normal respiratory effort, no intercostal retractions, no accessory muscle use. Heart: RRR,  no murmur.  No pretibial edema bilaterally  Skin: Not pale. Not jaundice Neurologic:  alert & oriented X3.  Speech normal, gait appropriate for age and unassisted Psych--  Cognition and judgment appear intact.  Cooperative with normal attention span and concentration.  Behavior appropriate. No anxious or depressed appearing.      Assessment      Assessment   Hyperlipidemia.  Rx Pravachol 10-2016, good results Neuropathy: DX 2015, feet numbness, decreased pinprick exam,  declined NCS Tremor, likely essential ; dx 11/2015 ++ s/e w/ primidone (2018) Anxiety depression Gait d/o, falls, referred to PT 2014   PLAN: Hyperlipidemia: Good control on Pravachol Tremor, likely essential.  Currently on no medications.  Currently symptoms are not bothersome Anxiety, depression: Continue Lexapro and Wellbutrin, symptoms controlled, her husband was recently diagnosed with dementia, we discussed the issue. Last year was prescribed clonazepam in an effort to decrease anxiety and consequently tremors but she was too sleepy and she is currently doing well. Preventive care: PNM 23 today, encouraged to get her Shingrix immunization at the pharmacy RTC 6 months CPX

## 2018-05-04 NOTE — Progress Notes (Signed)
Pre visit review using our clinic review tool, if applicable. No additional management support is needed unless otherwise documented below in the visit note. 

## 2018-05-23 ENCOUNTER — Ambulatory Visit: Payer: Self-pay | Admitting: *Deleted

## 2018-06-03 ENCOUNTER — Other Ambulatory Visit: Payer: Self-pay | Admitting: Internal Medicine

## 2018-08-12 NOTE — Progress Notes (Addendum)
Virtual Visit via Video Note  I connected with patient on 08/13/18 at 11:00 AM EDT by a video enabled telemedicine application and verified that I am speaking with the correct person using two identifiers.   THIS ENCOUNTER IS A VIRTUAL VISIT DUE TO COVID-19 - PATIENT WAS NOT SEEN IN THE OFFICE. PATIENT HAS CONSENTED TO VIRTUAL VISIT / TELEMEDICINE VISIT   Location of patient: home  Location of provider: office  I discussed the limitations of evaluation and management by telemedicine and the availability of in person appointments. The patient expressed understanding and agreed to proceed.   Subjective:   Andrea Martin is a 83 y.o. female who presents for Medicare Annual (Subsequent) preventive examination.  Review of Systems: No ROS.  Medicare Wellness Virtual Visit.  Visual/audio telehealth visit, UTA vital signs.   See social history for additional risk factors. Cardiac Risk Factors include: advanced age (>5055men, 40>65 women);dyslipidemia;hypertension Sleep patterns: no issues Home Safety/Smoke Alarms: Feels safe in home. Smoke alarms in place.   Female:        Mammo- declines at this time. Will discuss w/ PCP in sept.      Dexa scan-   Active order         Objective:    Tobacco Social History   Tobacco Use  Smoking Status Never Smoker  Smokeless Tobacco Never Used     Counseling given: Not Answered   Clinical Intake: Pain : No/denies pain   Past Medical History:  Diagnosis Date  . Anxiety and depression   . Gait disorder   . Hyperlipidemia   . Neuropathy 08/04/2013  . Shoulder dislocation    h/o after a fall   Past Surgical History:  Procedure Laterality Date  . NO PAST SURGERIES     Family History  Problem Relation Age of Onset  . Heart disease Father        CAD?  Marland Kitchen. Coronary artery disease Brother 7550  . Heart disease Mother   . Breast cancer Neg Hx   . Colon cancer Neg Hx   . Diabetes Neg Hx   . Stroke Neg Hx    Social History   Socioeconomic  History  . Marital status: Married    Spouse name: Not on file  . Number of children: 1  . Years of education: Not on file  . Highest education level: Not on file  Occupational History  . Occupation: retired  Engineer, productionocial Needs  . Financial resource strain: Not on file  . Food insecurity    Worry: Not on file    Inability: Not on file  . Transportation needs    Medical: Not on file    Non-medical: Not on file  Tobacco Use  . Smoking status: Never Smoker  . Smokeless tobacco: Never Used  Substance and Sexual Activity  . Alcohol use: No  . Drug use: No  . Sexual activity: Not on file  Lifestyle  . Physical activity    Days per week: Not on file    Minutes per session: Not on file  . Stress: Not on file  Relationships  . Social Musicianconnections    Talks on phone: Not on file    Gets together: Not on file    Attends religious service: Not on file    Active member of club or organization: Not on file    Attends meetings of clubs or organizations: Not on file    Relationship status: Not on file  Other Topics Concern  .  Not on file  Social History Narrative   Lives w/ husband , share the house w/ daughter and g-son    Outpatient Encounter Medications as of 08/15/2018  Medication Sig  . azelastine (ASTELIN) 0.1 % nasal spray Place 2 sprays into both nostrils at bedtime as needed for rhinitis. Use in each nostril as directed  . buPROPion (WELLBUTRIN SR) 150 MG 12 hr tablet Take 2 tablets (300 mg total) by mouth daily.  . calcium carbonate (OS-CAL) 600 MG TABS Take 600 mg by mouth daily.  . Cholecalciferol (VITAMIN D-3) 1000 UNITS CAPS Take 1 capsule by mouth daily.  . Cyanocobalamin (VITAMIN B 12 PO) Take 1 tablet by mouth daily.  Marland Kitchen. escitalopram (LEXAPRO) 20 MG tablet Take 1 tablet (20 mg total) by mouth daily.  . fish oil-omega-3 fatty acids 1000 MG capsule Take 1 g by mouth daily.  Marland Kitchen. glucosamine-chondroitin 500-400 MG tablet Take 1 tablet by mouth daily.  . Multiple Vitamin  (MULTIVITAMIN) tablet Take 1 tablet by mouth daily.  . pravastatin (PRAVACHOL) 20 MG tablet Take 1 tablet (20 mg total) by mouth at bedtime.  . Pyridoxine HCl (B-6) 100 MG TABS Take 100 mg by mouth daily.   No facility-administered encounter medications on file as of 08/15/2018.     Activities of Daily Living In your present state of health, do you have any difficulty performing the following activities: 08/15/2018 12/15/2017  Hearing? N N  Vision? N N  Difficulty concentrating or making decisions? N N  Walking or climbing stairs? N N  Dressing or bathing? N N  Doing errands, shopping? N N  Preparing Food and eating ? N -  Using the Toilet? N -  In the past six months, have you accidently leaked urine? N -  Do you have problems with loss of bowel control? N -  Managing your Medications? N -  Managing your Finances? N -  Housekeeping or managing your Housekeeping? N -  Some recent data might be hidden    Patient Care Team: Wanda PlumpPaz, Jose E, MD as PCP - General (Internal Medicine) Ranee GosselinGioffre, Ronald, MD as Consulting Physician (Orthopedic Surgery) Vida RiggerMagod, Marc, MD as Consulting Physician (Gastroenterology)    Assessment:   This is a routine wellness examination for [place]ilwa[place]irley. Physical assessment deferred to PCP.  Exercise Activities and Dietary recommendations Current Exercise Habits: The patient does not participate in regular exercise at present, Exercise limited by: None identified Diet (meal preparation, eat out, water intake, caffeinated beverages, dairy products, fruits and vegetables):  Breakfast: muffin or cereal , juice Lunch: sandwich  Dinner:  BLT  Goals    . Decrease pepsi to 1 per day    . DIET - INCREASE WATER INTAKE       Fall Risk Fall Risk  08/15/2018 11/03/2017 11/02/2016 10/18/2015 09/26/2014  Falls in the past year? 0 Yes No No No  Number falls in past yr: - 2 or more - - -  Injury with Fall? - No - - -  Risk for fall due to : - Medication side effect;Impaired  balance/gait - - -   Depression Screen PHQ 2/9 Scores 08/15/2018 05/04/2018 11/03/2017 11/02/2016  PHQ - 2 Score 0 0 0 0  PHQ- 9 Score - 1 - 0     Cognitive Function Ad8 score reviewed for issues:  Issues making decisions:no  Less interest in hobbies / activities:no  Repeats questions, stories (family complaining):no  Trouble using ordinary gadgets (microwave, computer, phone):no  Forgets the month or year: no  Mismanaging finances: no  Remembering appts:no  Daily problems with thinking and/or memory:no Ad8 score is=0        Immunization History  Administered Date(s) Administered  . Influenza Split 12/25/2011  . Influenza, High Dose Seasonal PF 12/09/2012, 11/29/2015  . Influenza,inj,Quad PF,6+ Mos 12/08/2013  . Influenza-Unspecified 12/24/2014, 12/10/2016, 12/29/2017  . Pneumococcal Conjugate-13 08/04/2013  . Pneumococcal Polysaccharide-23 01/23/2010, 05/04/2018  . Td 04/27/2011  . Zoster 04/23/2009   Screening Tests Health Maintenance  Topic Date Due  . INFLUENZA VACCINE  09/24/2018  . TETANUS/TDAP  04/26/2021  . DEXA SCAN  Completed  . PNA vac Low Risk Adult  Completed       Plan:   See you next year!  Continue to eat heart healthy diet (full of fruits, vegetables, whole grains, lean protein, water--limit salt, fat, and sugar intake) and increase physical activity as tolerated.  Continue doing brain stimulating activities (puzzles, reading, adult coloring books, staying active) to keep memory sharp.   Bring a copy of your living will and/or healthcare power of attorney to your next office visit.   I have personally reviewed and noted the following in the patient's chart:   . Medical and social history . Use of alcohol, tobacco or illicit drugs  . Current medications and supplements . Functional ability and status . Nutritional status . Physical activity . Advanced directives . List of other physicians . Hospitalizations, surgeries, and ER visits  in previous 12 months . Vitals . Screenings to include cognitive, depression, and falls . Referrals and appointments  In addition, I have reviewed and discussed with patient certain preventive protocols, quality metrics, and best practice recommendations. A written personalized care plan for preventive services as well as general preventive health recommendations were provided to patient.     Naaman Plummer [place], [place]  08/15/2018  Kathlene November, MD

## 2018-08-15 ENCOUNTER — Other Ambulatory Visit: Payer: Self-pay

## 2018-08-15 ENCOUNTER — Ambulatory Visit (INDEPENDENT_AMBULATORY_CARE_PROVIDER_SITE_OTHER): Payer: Medicare Other | Admitting: *Deleted

## 2018-08-15 ENCOUNTER — Encounter: Payer: Self-pay | Admitting: *Deleted

## 2018-08-15 DIAGNOSIS — Z0000 Encounter for general adult medical examination without abnormal findings: Secondary | ICD-10-CM | POA: Diagnosis not present

## 2018-08-15 NOTE — Patient Instructions (Signed)
See you next year!  Continue to eat heart healthy diet (full of fruits, vegetables, whole grains, lean protein, water--limit salt, fat, and sugar intake) and increase physical activity as tolerated.  Continue doing brain stimulating activities (puzzles, reading, adult coloring books, staying active) to keep memory sharp.   Bring a copy of your living will and/or healthcare power of attorney to your next office visit.   Andrea Martin , Thank you for taking time to come for your Medicare Wellness Visit. I appreciate your ongoing commitment to your health goals. Please review the following plan we discussed and let me know if I can assist you in the future.   These are the goals we discussed: Goals    . Decrease pepsi to 1 per day    . DIET - INCREASE WATER INTAKE       This is a list of the screening recommended for you and due dates:  Health Maintenance  Topic Date Due  . Flu Shot  09/24/2018  . Tetanus Vaccine  04/26/2021  . DEXA scan (bone density measurement)  Completed  . Pneumonia vaccines  Completed    Health Maintenance After Age 74 After age 100, you are at a higher risk for certain long-term diseases and infections as well as injuries from falls. Falls are a major cause of broken bones and head injuries in people who are older than age 18. Getting regular preventive care can help to keep you healthy and well. Preventive care includes getting regular testing and making lifestyle changes as recommended by your health care provider. Talk with your health care provider about:  Which screenings and tests you should have. A screening is a test that checks for a disease when you have no symptoms.  A diet and exercise plan that is right for you. What should I know about screenings and tests to prevent falls? Screening and testing are the best ways to find a health problem early. Early diagnosis and treatment give you the best chance of managing medical conditions that are common after  age 13. Certain conditions and lifestyle choices may make you more likely to have a fall. Your health care provider may recommend:  Regular vision checks. Poor vision and conditions such as cataracts can make you more likely to have a fall. If you wear glasses, make sure to get your prescription updated if your vision changes.  Medicine review. Work with your health care provider to regularly review all of the medicines you are taking, including over-the-counter medicines. Ask your health care provider about any side effects that may make you more likely to have a fall. Tell your health care provider if any medicines that you take make you feel dizzy or sleepy.  Osteoporosis screening. Osteoporosis is a condition that causes the bones to get weaker. This can make the bones weak and cause them to break more easily.  Blood pressure screening. Blood pressure changes and medicines to control blood pressure can make you feel dizzy.  Strength and balance checks. Your health care provider may recommend certain tests to check your strength and balance while standing, walking, or changing positions.  Foot health exam. Foot pain and numbness, as well as not wearing proper footwear, can make you more likely to have a fall.  Depression screening. You may be more likely to have a fall if you have a fear of falling, feel emotionally low, or feel unable to do activities that you used to do.  Alcohol use screening. Using  too much alcohol can affect your balance and may make you more likely to have a fall. What actions can I take to lower my risk of falls? General instructions  Talk with your health care provider about your risks for falling. Tell your health care provider if: ? You fall. Be sure to tell your health care provider about all falls, even ones that seem minor. ? You feel dizzy, sleepy, or off-balance.  Take over-the-counter and prescription medicines only as told by your health care provider.  These include any supplements.  Eat a healthy diet and maintain a healthy weight. A healthy diet includes low-fat dairy products, low-fat (lean) meats, and fiber from whole grains, beans, and lots of fruits and vegetables. Home safety  Remove any tripping hazards, such as rugs, cords, and clutter.  Install safety equipment such as grab bars in bathrooms and safety rails on stairs.  Keep rooms and walkways well-lit. Activity   Follow a regular exercise program to stay fit. This will help you maintain your balance. Ask your health care provider what types of exercise are appropriate for you.  If you need a cane or walker, use it as recommended by your health care provider.  Wear supportive shoes that have nonskid soles. Lifestyle  Do not drink alcohol if your health care provider tells you not to drink.  If you drink alcohol, limit how much you have: ? 0-1 drink a day for women. ? 0-2 drinks a day for men.  Be aware of how much alcohol is in your drink. In the [place] one drink equals one typical bottle of beer (12 oz), one-half glass of wine (5 oz), or one shot of hard liquor (1 oz).  Do not use any products that contain nicotine or tobacco, such as cigarettes and e-cigarettes. If you need help quitting, ask your health care provider. Summary  Having a healthy lifestyle and getting preventive care can help to protect your health and wellness after age 81.  Screening and testing are the best way to find a health problem early and help you avoid having a fall. Early diagnosis and treatment give you the best chance for managing medical conditions that are more common for people who are older than age 81.  Falls are a major cause of broken bones and head injuries in people who are older than age 81. Take precautions to prevent a fall at home.  Work with your health care provider to learn what changes you can make to improve your health and wellness and to prevent falls. This  information is not intended to replace advice given to you by your health care provider. Make sure you discuss any questions you have with your health care provider. Document Released: 12/23/2016 Document Revised: 12/23/2016 Document Reviewed: 12/23/2016 Elsevier Interactive Patient Education  2019 [employer]

## 2018-09-21 ENCOUNTER — Telehealth: Payer: Self-pay | Admitting: Internal Medicine

## 2018-09-21 MED ORDER — BUPROPION HCL ER (SR) 150 MG PO TB12: 300.0000 mg | ORAL_TABLET | Freq: Every day | ORAL | 0 refills | Status: DC

## 2018-09-21 NOTE — Telephone Encounter (Signed)
Copied from [place] [id number]. Topic: Quick Communication - Rx Refill/Question >> Sep 21, 2018  2:24 PM Nils Flack, Marland Kitchen wrote: Medication: buPROPion (WELLBUTRIN SR) 150 MG 12 hr tablet  Has the patient contacted their pharmacy? Yes.   (Agent: If no, request that the patient contact the pharmacy for the refill.) (Agent: If yes, when and what did the pharmacy advise?)  Preferred Pharmacy (with phone number or street name): [facility] [place]  Pt is having trouble with [facility] and they will not fill it, they told her acct was closed Pt asking for 1 mo supply   Agent: Please be advised that RX refills may take up to 3 business days. We ask that you follow-up with your pharmacy.

## 2018-09-21 NOTE — Telephone Encounter (Signed)
Rx sent 

## 2018-09-25 ENCOUNTER — Encounter: Payer: Self-pay | Admitting: Internal Medicine

## 2018-09-26 MED ORDER — PRAVASTATIN SODIUM 20 MG PO TABS: 20.0000 mg | ORAL_TABLET | Freq: Every day | ORAL | 1 refills | Status: DC

## 2018-09-26 MED ORDER — BUPROPION HCL ER (SR) 150 MG PO TB12: 300.0000 mg | ORAL_TABLET | Freq: Every day | ORAL | 1 refills | Status: DC

## 2018-09-26 MED ORDER — ESCITALOPRAM OXALATE 20 MG PO TABS: 20.0000 mg | ORAL_TABLET | Freq: Every day | ORAL | 1 refills | Status: DC

## 2018-11-03 ENCOUNTER — Other Ambulatory Visit: Payer: Self-pay

## 2018-11-03 ENCOUNTER — Other Ambulatory Visit (INDEPENDENT_AMBULATORY_CARE_PROVIDER_SITE_OTHER): Payer: Medicare Other

## 2018-11-03 DIAGNOSIS — Z0000 Encounter for general adult medical examination without abnormal findings: Secondary | ICD-10-CM

## 2018-11-03 LAB — CBC WITH DIFFERENTIAL/PLATELET
Basophils Absolute: 0 10*3/uL (ref 0.0–0.1)
Basophils Relative: 0.6 % (ref 0.0–3.0)
Eosinophils Absolute: 0.2 10*3/uL (ref 0.0–0.7)
Eosinophils Relative: 3.6 % (ref 0.0–5.0)
HCT: 40.7 % (ref 36.0–46.0)
Hemoglobin: 13.7 g/dL (ref 12.0–15.0)
Lymphocytes Relative: 39 % (ref 12.0–46.0)
Lymphs Abs: 1.9 10*3/uL (ref 0.7–4.0)
MCHC: 33.7 g/dL (ref 30.0–36.0)
MCV: 95.9 fl (ref 78.0–100.0)
Monocytes Absolute: 0.5 10*3/uL (ref 0.1–1.0)
Monocytes Relative: 9.6 % (ref 3.0–12.0)
Neutro Abs: 2.3 10*3/uL (ref 1.4–7.7)
Neutrophils Relative %: 47.2 % (ref 43.0–77.0)
Platelets: 249 10*3/uL (ref 150.0–400.0)
RBC: 4.25 Mil/uL (ref 3.87–5.11)
RDW: 12.9 % (ref 11.5–15.5)
WBC: 4.9 10*3/uL (ref 4.0–10.5)

## 2018-11-03 LAB — LIPID PANEL
Cholesterol: 212 mg/dL — ABNORMAL HIGH (ref 0–200)
HDL: 45.4 mg/dL (ref >39.00)
LDL Cholesterol: 136 mg/dL — ABNORMAL HIGH (ref 0–99)
NonHDL: 166.59
Total CHOL/HDL Ratio: 5
Triglycerides: 154 mg/dL — ABNORMAL HIGH (ref 0.0–149.0)
VLDL: 30.8 mg/dL (ref 0.0–40.0)

## 2018-11-03 LAB — COMPREHENSIVE METABOLIC PANEL WITH GFR
ALT: 13 U/L (ref 0–35)
AST: 17 U/L (ref 0–37)
Albumin: 3.8 g/dL (ref 3.5–5.2)
Alkaline Phosphatase: 65 U/L (ref 39–117)
BUN: 19 mg/dL (ref 6–23)
CO2: 29 meq/L (ref 19–32)
Calcium: 9.4 mg/dL (ref 8.4–10.5)
Chloride: 107 meq/L (ref 96–112)
Creatinine, Ser: 1.03 mg/dL (ref 0.40–1.20)
GFR: 51.34 mL/min — ABNORMAL LOW
Glucose, Bld: 89 mg/dL (ref 70–99)
Potassium: 4.1 meq/L (ref 3.5–5.1)
Sodium: 142 meq/L (ref 135–145)
Total Bilirubin: 0.6 mg/dL (ref 0.2–1.2)
Total Protein: 6.3 g/dL (ref 6.0–8.3)

## 2018-11-09 ENCOUNTER — Ambulatory Visit (INDEPENDENT_AMBULATORY_CARE_PROVIDER_SITE_OTHER): Payer: Medicare Other | Admitting: Internal Medicine

## 2018-11-09 ENCOUNTER — Encounter: Payer: Self-pay | Admitting: Internal Medicine

## 2018-11-09 ENCOUNTER — Other Ambulatory Visit: Payer: Self-pay

## 2018-11-09 VITALS — BP 117/104 | HR 61 | Temp 96.2°F | Resp 16 | Ht 64.0 in | Wt 173.4 lb

## 2018-11-09 DIAGNOSIS — F329 Major depressive disorder, single episode, unspecified: Secondary | ICD-10-CM

## 2018-11-09 DIAGNOSIS — Z0000 Encounter for general adult medical examination without abnormal findings: Secondary | ICD-10-CM

## 2018-11-09 DIAGNOSIS — G250 Essential tremor: Secondary | ICD-10-CM

## 2018-11-09 DIAGNOSIS — F419 Anxiety disorder, unspecified: Secondary | ICD-10-CM

## 2018-11-09 DIAGNOSIS — Z1231 Encounter for screening mammogram for malignant neoplasm of breast: Secondary | ICD-10-CM

## 2018-11-09 DIAGNOSIS — L989 Disorder of the skin and subcutaneous tissue, unspecified: Secondary | ICD-10-CM

## 2018-11-09 DIAGNOSIS — E785 Hyperlipidemia, unspecified: Secondary | ICD-10-CM | POA: Diagnosis not present

## 2018-11-09 DIAGNOSIS — F32A Depression, unspecified: Secondary | ICD-10-CM

## 2018-11-09 DIAGNOSIS — Z780 Asymptomatic menopausal state: Secondary | ICD-10-CM

## 2018-11-09 NOTE — Progress Notes (Signed)
Subjective:    Patient ID: Andrea Martin, female    DOB: 12-29-1935, 83 y.o.   MRN: [id number]  DOS:  11/09/2018 Type of visit - description: CPX Since the last office visit she is doing well.  Review of Systems  A 14 point review of systems is negative    Past Medical History:  Diagnosis Date  . Anxiety and depression   . Gait disorder   . Hyperlipidemia   . Neuropathy 08/04/2013  . Shoulder dislocation    h/o after a fall    Past Surgical History:  Procedure Laterality Date  . NO PAST SURGERIES      Social History   Socioeconomic History  . Marital status: Married    Spouse name: Not on file  . Number of children: 1  . Years of education: Not on file  . Highest education level: Not on file  Occupational History  . Occupation: retired  Scientific laboratory technician  . Financial resource strain: Not on file  . Food insecurity    Worry: Not on file    Inability: Not on file  . Transportation needs    Medical: Not on file    Non-medical: Not on file  Tobacco Use  . Smoking status: Never Smoker  . Smokeless tobacco: Never Used  Substance and Sexual Activity  . Alcohol use: No  . Drug use: No  . Sexual activity: Not on file  Lifestyle  . Physical activity    Days per week: Not on file    Minutes per session: Not on file  . Stress: Not on file  Relationships  . Social Herbalist on phone: Not on file    Gets together: Not on file    Attends religious service: Not on file    Active member of club or organization: Not on file    Attends meetings of clubs or organizations: Not on file    Relationship status: Not on file  . Intimate partner violence    Fear of current or ex partner: Not on file    Emotionally abused: Not on file    Physically abused: Not on file    Forced sexual activity: Not on file  Other Topics Concern  . Not on file  Social History Narrative   Lives w/ husband , share the house w/ daughter and one of her 2 children      Family History   Problem Relation Age of Onset  . Heart disease Father        CAD?  Marland Kitchen Coronary artery disease Brother 20  . Heart disease Mother   . Breast cancer Neg Hx   . Colon cancer Neg Hx   . Diabetes Neg Hx   . Stroke Neg Hx      Allergies as of 11/09/2018      Reactions   Codeine Nausea And Vomiting   Sulfa Antibiotics    Was told allergic to sulfa does not remember why   Penicillins Rash      Medication List       Accurate as of November 09, 2018 11:59 PM. If you have any questions, ask your nurse or doctor.        azelastine 0.1 % nasal spray Commonly known as: ASTELIN Place 2 sprays into both nostrils at bedtime as needed for rhinitis. Use in each nostril as directed   buPROPion 150 MG 12 hr tablet Commonly known as: WELLBUTRIN SR Take 2 tablets (300 mg  total) by mouth daily.   calcium carbonate 600 MG Tabs tablet Commonly known as: OS-CAL Take 600 mg by mouth daily.   escitalopram 20 MG tablet Commonly known as: LEXAPRO Take 1 tablet (20 mg total) by mouth daily.   fish oil-omega-3 fatty acids 1000 MG capsule Take 1 g by mouth daily.   glucosamine-chondroitin 500-400 MG tablet Take 1 tablet by mouth daily.   multivitamin tablet Take 1 tablet by mouth daily.   pravastatin 20 MG tablet Commonly known as: PRAVACHOL Take 1 tablet (20 mg total) by mouth at bedtime.   PROBIOTIC DAILY PO Take by mouth.   Vitamin D-3 25 MCG (1000 UT) Caps Take 1 capsule by mouth daily.           Objective:   Physical Exam BP (!) 117/104 (BP Location: Left Arm, Patient Position: Sitting, Cuff Size: Small)   Pulse 61   Temp (!) 96.2 F (35.7 C) (Temporal)   Resp 16   Ht 5\' 4"  (1.626 m)   Wt 173 lb 6 oz (78.6 kg)   SpO2 100%   BMI 29.76 kg/m  General: Well developed, NAD, BMI noted Neck: No  thyromegaly  HEENT:  Normocephalic . Face symmetric, atraumatic Lungs:  CTA B Normal respiratory effort, no intercostal retractions, no accessory muscle use. Heart: RRR,  no  murmur.  No pretibial edema bilaterally  Abdomen:  Not distended, soft, non-tender. No rebound or rigidity.   Skin: Exposed areas without rash. Not pale. Not jaundice Neurologic:  alert & oriented X3.  Speech normal, gait appropriate for age and unassisted Strength symmetric and appropriate for age.  Minimal action tremor noted Psych: Cognition and judgment appear intact.  Cooperative with normal attention span and concentration.  Behavior appropriate. No anxious or depressed appearing.     Assessment      Assessment   Hyperlipidemia.  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 Anxiety depression: Husband has dementia, she is dealing with that fact very well.  Continue Lexapro and Wellbutrin Skin lesion, face: Request Derm referral.  Will do RTC 6 months

## 2018-11-09 NOTE — Patient Instructions (Addendum)
  GO TO THE FRONT DESK Schedule your next appointment   for checkup in 6 months  Please go to the first floor and schedule your mammogram and bone density test  Check the  blood pressure 2 or 3 times a month  BP GOAL is between 110/65 and  135/85. If it is consistently higher or lower, let me know

## 2018-11-09 NOTE — Progress Notes (Signed)
Pre visit review using our clinic review tool, if applicable. No additional management support is needed unless otherwise documented below in the visit note. 

## 2018-11-09 NOTE — Assessment & Plan Note (Signed)
-  Td 2013 - Pneumonia shot 2011 - prevnar 2015 - Zostavax 2008 - shingrix 10/2018 - to have a flu shot soon -[facility] No further PAP, see prevous entries  MMG: 11-2016 wnl, Rx MMG -[facility]: History of colonoscopies in 2004, 2007 , 2012,  2017: no polyps, f/u prn, Dr. Watt Climes -DEXA 07-2012 (-), on calcium and vitamin D, failed DEXA referral 2019, will try again -Diet-exercise: Discussed, room for improvement -Labs were done few days ago, discussed.

## 2018-11-10 NOTE — Assessment & Plan Note (Signed)
Here for CPX Hyperlipidemia: LDL 136, on statins, no change for now Tremor: On no meds, minimal at this point. Anxiety depression: Husband has dementia, she is dealing with that fact very well.  Continue Lexapro and Wellbutrin Skin lesion, face: Request Derm referral.  Will do RTC 6 months

## 2018-11-16 ENCOUNTER — Other Ambulatory Visit: Payer: Self-pay

## 2018-11-16 ENCOUNTER — Ambulatory Visit (HOSPITAL_BASED_OUTPATIENT_CLINIC_OR_DEPARTMENT_OTHER)
Admission: RE | Admit: 2018-11-16 | Discharge: 2018-11-16 | Disposition: A | Discharge status: Home / Self Care | Payer: Medicare Other | Source: Ambulatory Visit | Attending: Internal Medicine | Admitting: Internal Medicine

## 2018-11-16 DIAGNOSIS — Z1231 Encounter for screening mammogram for malignant neoplasm of breast: Secondary | ICD-10-CM | POA: Insufficient documentation

## 2018-11-16 DIAGNOSIS — Z780 Asymptomatic menopausal state: Secondary | ICD-10-CM | POA: Diagnosis not present

## 2018-11-16 DIAGNOSIS — M85832 Other specified disorders of bone density and structure, left forearm: Secondary | ICD-10-CM | POA: Diagnosis not present

## 2019-03-17 ENCOUNTER — Other Ambulatory Visit: Payer: Self-pay | Admitting: Internal Medicine

## 2019-05-01 ENCOUNTER — Other Ambulatory Visit: Payer: Self-pay | Admitting: Internal Medicine

## 2019-05-08 ENCOUNTER — Other Ambulatory Visit: Payer: Self-pay

## 2019-05-09 ENCOUNTER — Other Ambulatory Visit: Payer: Self-pay

## 2019-05-09 ENCOUNTER — Encounter: Payer: Self-pay | Admitting: Internal Medicine

## 2019-05-09 ENCOUNTER — Ambulatory Visit (INDEPENDENT_AMBULATORY_CARE_PROVIDER_SITE_OTHER): Payer: Medicare Other | Admitting: Internal Medicine

## 2019-05-09 VITALS — BP 147/84 | HR 62 | Temp 95.4°F | Resp 16 | Ht 64.0 in | Wt 168.0 lb

## 2019-05-09 DIAGNOSIS — F419 Anxiety disorder, unspecified: Secondary | ICD-10-CM | POA: Diagnosis not present

## 2019-05-09 DIAGNOSIS — E785 Hyperlipidemia, unspecified: Secondary | ICD-10-CM

## 2019-05-09 DIAGNOSIS — F329 Major depressive disorder, single episode, unspecified: Secondary | ICD-10-CM | POA: Diagnosis not present

## 2019-05-09 DIAGNOSIS — F32A Depression, unspecified: Secondary | ICD-10-CM

## 2019-05-09 LAB — LIPID PANEL
Cholesterol: 210 mg/dL — ABNORMAL HIGH (ref 0–200)
HDL: 51.3 mg/dL (ref >39.00)
LDL Cholesterol: 130 mg/dL — ABNORMAL HIGH (ref 0–99)
NonHDL: 159.05
Total CHOL/HDL Ratio: 4
Triglycerides: 146 mg/dL (ref 0.0–149.0)
VLDL: 29.2 mg/dL (ref 0.0–40.0)

## 2019-05-09 NOTE — Patient Instructions (Signed)
GO TO THE LAB : Get the blood work     GO TO THE FRONT DESK Come back for a physical exam in 6 months , please make an appointment   

## 2019-05-09 NOTE — Progress Notes (Signed)
Subjective:    Patient ID: Andrea Martin, female    DOB: 1935/05/17, 84 y.o.   MRN: [id number]  DOS:  05/09/2019 Type of visit - description: Follow-up Today we talk about anxiety, depression, hyperlipidemia. In general feels well. Good med compliance. Her husband has dementia, fortunately no behavioral issues.   Review of Systems Denies chest pain, difficulty breathing.  No lower extremity edema Past Medical History:  Diagnosis Date  . Anxiety and depression   . Gait disorder   . Hyperlipidemia   . Neuropathy 08/04/2013  . Shoulder dislocation    h/o after a fall    Past Surgical History:  Procedure Laterality Date  . NO PAST SURGERIES      Allergies as of 05/09/2019      Reactions   Codeine Nausea And Vomiting   Sulfa Antibiotics    Was told allergic to sulfa does not remember why   Penicillins Rash      Medication List       Accurate as of May 09, 2019 11:59 PM. If you have any questions, ask your nurse or doctor.        azelastine 0.1 % nasal spray Commonly known as: ASTELIN Place 2 sprays into both nostrils at bedtime as needed for rhinitis. Use in each nostril as directed   buPROPion 150 MG 12 hr tablet Commonly known as: WELLBUTRIN SR Take 2 tablets (300 mg total) by mouth daily.   calcium carbonate 600 MG Tabs tablet Commonly known as: OS-CAL Take 600 mg by mouth daily.   escitalopram 20 MG tablet Commonly known as: LEXAPRO Take 1 tablet (20 mg total) by mouth daily.   fish oil-omega-3 fatty acids 1000 MG capsule Take 1 g by mouth daily.   glucosamine-chondroitin 500-400 MG tablet Take 1 tablet by mouth daily.   multivitamin tablet Take 1 tablet by mouth daily.   pravastatin 20 MG tablet Commonly known as: PRAVACHOL Take 1 tablet (20 mg total) by mouth at bedtime.   PROBIOTIC DAILY PO Take by mouth.   Vitamin D-3 25 MCG (1000 UT) Caps Take 1 capsule by mouth daily.          Objective:   Physical Exam BP (!) 147/84 (BP  Location: Left Arm, Patient Position: Sitting, Cuff Size: Normal)   Pulse 62   Temp (!) 95.4 F (35.2 C) (Temporal)   Resp 16   Ht 5\' 4"  (1.626 m)   Wt 168 lb (76.2 kg)   SpO2 100%   BMI 28.84 kg/m  General:   Well developed, NAD, BMI noted. HEENT:  Normocephalic . Face symmetric, atraumatic Lungs:  CTA B Normal respiratory effort, no intercostal retractions, no accessory muscle use. Heart: RRR,  no murmur.  Lower extremities: no pretibial edema bilaterally  Skin: Not pale. Not jaundice Neurologic:  alert & oriented X3.  Speech normal, gait appropriate for age and unassisted Psych--  Cognition and judgment appear intact.  Cooperative with normal attention span and concentration.  Behavior appropriate. No anxious or depressed appearing.      Assessment     Assessment   Hyperlipidemia.  Simvastatin intolerant.  Rx Pravachol 10-2016  Neuropathy: DX 2015, feet numbness, decreased pinprick exam,  declined NCS Tremor, likely essential ; dx 11/2015 ++ s/e w/ primidone (2018) Anxiety depression Gait d/o, falls, referred to PT 2014   PLAN: Hyperlipidemia:   baseline LDL 213 (11/03/2016), now on Pravachol, LDL is 136  No history of CAD or diabetes.  Plan: Recheck FLP, watch  diet closely Anxiety, depression:Currently well controlled, continue with Wellbutrin, Lexapro. Preventive care: S/p Covid vaccination x2 RTC 6 months CPX     This visit occurred during the SARS-CoV-2 public health emergency.  Safety protocols were in place, including screening questions prior to the visit, additional usage of staff PPE, and extensive cleaning of exam room while observing appropriate contact time as indicated for disinfecting solutions.

## 2019-05-09 NOTE — Progress Notes (Signed)
Pre visit review using our clinic review tool, if applicable. No additional management support is needed unless otherwise documented below in the visit note. 

## 2019-05-10 NOTE — Assessment & Plan Note (Signed)
Hyperlipidemia:   baseline LDL 213 (11/03/2016), now on Pravachol, LDL is 136  No history of CAD or diabetes.  Plan: Recheck FLP, watch diet closely Anxiety, depression:Currently well controlled, continue with Wellbutrin, Lexapro. Preventive care: S/p Covid vaccination x2 RTC 6 months CPX

## 2019-05-16 ENCOUNTER — Telehealth: Payer: Self-pay | Admitting: Internal Medicine

## 2019-05-16 NOTE — Telephone Encounter (Signed)
Please advise 

## 2019-05-16 NOTE — Telephone Encounter (Signed)
LMOM asking for call back.  

## 2019-05-16 NOTE — Telephone Encounter (Signed)
I reviewed the last note, I was under the impression that she was doing well.  She is already taking the top dose of Lexapro. Plan: Increase Wellbutrin are 150 mg from 2 to 3 tablets a day, that will be the top dose. Continue Lexapro Needs counseling if not doing that already Reassess in 3 to 4 weeks

## 2019-05-16 NOTE — Telephone Encounter (Signed)
Patient states that she needs something for depression. Patient was offer medication last week, patient states a family matter has her feeling bad.  Andrea Martin [facility] [place], [place] - [address]  [address] [place] [address]  Phone:  [phone] Fax:  [phone]

## 2019-06-06 ENCOUNTER — Other Ambulatory Visit: Payer: Self-pay | Admitting: Internal Medicine

## 2019-07-17 ENCOUNTER — Telehealth: Payer: Self-pay | Admitting: Internal Medicine

## 2019-07-17 NOTE — Telephone Encounter (Signed)
Spoke to patient appointment schedule for 07/19/19.

## 2019-07-17 NOTE — Telephone Encounter (Signed)
Needs an appt- Pt missed last ov.

## 2019-07-17 NOTE — Telephone Encounter (Signed)
Caller: Andrea Martin daugther Call back phone [phone]  Andrea Martin is calling in regards to her mom Andrea Martin requesting some depression medicine.   Medicine should be sent to  [facility] [address], [place], [place]

## 2019-07-19 ENCOUNTER — Ambulatory Visit (INDEPENDENT_AMBULATORY_CARE_PROVIDER_SITE_OTHER): Payer: Medicare Other | Admitting: Internal Medicine

## 2019-07-19 ENCOUNTER — Other Ambulatory Visit: Payer: Self-pay

## 2019-07-19 ENCOUNTER — Encounter: Payer: Self-pay | Admitting: Internal Medicine

## 2019-07-19 VITALS — BP 99/79 | HR 65 | Temp 95.6°F | Resp 18 | Ht 64.0 in | Wt 154.2 lb

## 2019-07-19 DIAGNOSIS — F419 Anxiety disorder, unspecified: Secondary | ICD-10-CM

## 2019-07-19 DIAGNOSIS — F32A Depression, unspecified: Secondary | ICD-10-CM

## 2019-07-19 DIAGNOSIS — R251 Tremor, unspecified: Secondary | ICD-10-CM

## 2019-07-19 DIAGNOSIS — F329 Major depressive disorder, single episode, unspecified: Secondary | ICD-10-CM | POA: Diagnosis not present

## 2019-07-19 MED ORDER — ALPRAZOLAM 0.5 MG PO TABS: 0.2500 mg | ORAL_TABLET | Freq: Three times a day (TID) | ORAL | 0 refills | Status: DC | PRN

## 2019-07-19 NOTE — Progress Notes (Signed)
Pre visit review using our clinic review tool, if applicable. No additional management support is needed unless otherwise documented below in the visit note. 

## 2019-07-19 NOTE — Progress Notes (Signed)
Subjective:    Patient ID: Andrea Martin, female    DOB: Apr 16, 1935, 84 y.o.   MRN: [id number]  DOS:  07/19/2019 Type of visit - description: Acute Chief complaint is anxiety. She has a lot on her plate. At home she takes care of her husband who has dementia, he is very calm and cooperative but her daughter and 2 grandchildren live with them. She is having issues with a 56 year old grandson. Wonders if a "as needed" medication will help her.  She has a history of tremors, they have increased lately.   Review of Systems Reports no fever chills No weight loss No suicidal ideas No headaches She reports she is taking naps twice a day, unusual for her, she thinks is her way to deal with stress at home.  Despite that, when she is awake she feels well, not somnolent No snoring  Past Medical History:  Diagnosis Date  . Anxiety and depression   . Gait disorder   . Hyperlipidemia   . Neuropathy 08/04/2013  . Shoulder dislocation    h/o after a fall    Past Surgical History:  Procedure Laterality Date  . NO PAST SURGERIES      Allergies as of 07/19/2019      Reactions   Codeine Nausea And Vomiting   Sulfa Antibiotics    Was told allergic to sulfa does not remember why   Penicillins Rash      Medication List       Accurate as of Jul 19, 2019  3:33 PM. If you have any questions, ask your nurse or doctor.        azelastine 0.1 % nasal spray Commonly known as: ASTELIN Place 2 sprays into both nostrils at bedtime as needed for rhinitis. Use in each nostril as directed   buPROPion 150 MG 12 hr tablet Commonly known as: WELLBUTRIN SR Take 2 tablets (300 mg total) by mouth daily.   calcium carbonate 600 MG Tabs tablet Commonly known as: OS-CAL Take 600 mg by mouth daily.   escitalopram 20 MG tablet Commonly known as: LEXAPRO Take 1 tablet (20 mg total) by mouth daily.   fish oil-omega-3 fatty acids 1000 MG capsule Take 1 g by mouth daily.   glucosamine-chondroitin  500-400 MG tablet Take 1 tablet by mouth daily.   multivitamin tablet Take 1 tablet by mouth daily.   pravastatin 20 MG tablet Commonly known as: PRAVACHOL Take 1 tablet (20 mg total) by mouth at bedtime.   PROBIOTIC DAILY PO Take by mouth.   Vitamin D-3 25 MCG (1000 UT) Caps Take 1 capsule by mouth daily.          Objective:   Physical Exam BP 99/79 (BP Location: Left Arm, Patient Position: Sitting, Cuff Size: Small)   Pulse 65   Temp (!) 95.6 F (35.3 C) (Temporal)   Resp 18   Ht 5\' 4"  (1.626 m)   Wt 154 lb 4 oz (70 kg)   SpO2 98%   BMI 26.48 kg/m  General:   Well developed, NAD, BMI noted. HEENT:  Normocephalic . Face symmetric, atraumatic Lungs:  CTA B Normal respiratory effort, no intercostal retractions, no accessory muscle use. Heart: RRR,  no murmur.  Lower extremities: no pretibial edema bilaterally  Skin: Not pale. Not jaundice Neurologic:  alert & oriented X3.  Speech normal, gait appropriate for age and unassisted + Tremors noted at hands. Psych--  Cognition and judgment appear intact.  Cooperative with normal attention span and  concentration.  Behavior appropriate. Tearful at times during the visit     Assessment      Assessment   Hyperlipidemia.  Simvastatin intolerant.  Rx Pravachol 10-2016  Neuropathy: DX 2015, feet numbness, decreased pinprick exam,  declined NCS Tremor, likely essential ; dx 11/2015 ++ s/e w/ primidone (2018) Anxiety depression Gait d/o, falls, referred to PT 2014   PLAN: Anxiety, depression: Currently on Wellbutrin SR 300 mg daily and Lexapro 20 mg daily. She was doing well until a few months ago, she is having issues at home with one of  her grandsons.  Also the Covid quarantine of the last year affected her. She asked for something to take as needed. PHQ-9: 19, moderate, again she is not suicidal. Although she is elderly, I think low-dose of Xanax would be appropriate.  She is warned about excessive sedation and  fall risk. We will try this approach, if that does not work,will have to try different daily medications. Encourage to seek counseling.  See AVS. Tremors: Slightly worse lately, she thinks is a stress related, I agree.  We will check a TSH for completeness.   I spent 35 minutes with the patient today, we had extended discussion about anxiety, depression, listening therapy provided   This visit occurred during the SARS-CoV-2 public health emergency.  Safety protocols were in place, including screening questions prior to the visit, additional usage of staff PPE, and extensive cleaning of exam room while observing appropriate contact time as indicated for disinfecting solutions.

## 2019-07-19 NOTE — Patient Instructions (Signed)
Start alprazolam (Xanax), take it up to 3 times a day as needed for severe anxiety.  Watch for excessive sedation.  Continue your other medications.  Call for refills when needed  If this is not working please let me know  Please reach out for a counselor.  [facility], [address] [place], [place] #[phone]   [facility], [address], [place], [place] #[phone]    [facility], [address], [place], [place] #[phone]   [facility]  [address] [place], [place] [phone]   [facility]  [address] [place], [place] #[phone]

## 2019-07-20 LAB — TSH: TSH: 4.3 u[IU]/mL (ref 0.35–4.50)

## 2019-07-21 NOTE — Assessment & Plan Note (Signed)
Anxiety, depression: Currently on Wellbutrin SR 300 mg daily and Lexapro 20 mg daily. She was doing well until a few months ago, she is having issues at home with one of  her grandsons.  Also the Covid quarantine of the last year affected her. She asked for something to take as needed. PHQ-9: 19, moderate, again she is not suicidal. Although she is elderly, I think low-dose of Xanax would be appropriate.  She is warned about excessive sedation and fall risk. We will try this approach, if that does not work,will have to try different daily medications. Encourage to seek counseling.  See AVS. Tremors: Slightly worse lately, she thinks is a stress related, I agree.  We will check a TSH for completeness.

## 2019-08-16 NOTE — Progress Notes (Signed)
I connected with Andrea Martin today by telephone and verified that I am speaking with the correct person using two identifiers. Location patient: home Location provider: work Persons participating in the virtual visit: patient, Engineer, civil (consulting).    I discussed the limitations, risks, security and privacy concerns of performing an evaluation and management service by telephone and the availability of in person appointments. I also discussed with the patient that there may be a patient responsible charge related to this service. The patient expressed understanding and verbally consented to this telephonic visit.    Interactive audio and video telecommunications were attempted between this provider and patient, however failed, due to patient having technical difficulties OR patient did not have access to video capability.  We continued and completed visit with audio only.  Some vital signs may be absent or patient reported.    Subjective:   Andrea Martin is a 84 y.o. female who presents for Medicare Annual (Subsequent) preventive examination.  Review of Systems     Cardiac Risk Factors include: advanced age (>51men, >74 women);dyslipidemia     Objective:     Advanced Directives 08/17/2019 08/15/2018  Does Patient Have a Medical Advance Directive? Yes Yes  Type of Estate agent of [place];Living will Healthcare Power of [place];Living will  Does patient want to make changes to medical advance directive? No - Patient declined No - Patient declined  Copy of Healthcare Power of Attorney in Chart? No - copy requested No - copy requested    Current Medications (verified) Outpatient Encounter Medications as of 08/17/2019  Medication Sig  . ALPRAZolam (XANAX) 0.5 MG tablet Take 0.5-1 tablets (0.25-0.5 mg total) by mouth 3 (three) times daily as needed for anxiety.  Marland Kitchen azelastine (ASTELIN) 0.1 % nasal spray Place 2 sprays into both nostrils at bedtime as needed for rhinitis. Use in  each nostril as directed  . buPROPion (WELLBUTRIN SR) 150 MG 12 hr tablet Take 2 tablets (300 mg total) by mouth daily.  . calcium carbonate (OS-CAL) 600 MG TABS Take 600 mg by mouth daily.  . Cholecalciferol (VITAMIN D-3) 1000 UNITS CAPS Take 1 capsule by mouth daily.  Marland Kitchen escitalopram (LEXAPRO) 20 MG tablet Take 1 tablet (20 mg total) by mouth daily.  . fish oil-omega-3 fatty acids 1000 MG capsule Take 1 g by mouth daily.  Marland Kitchen glucosamine-chondroitin 500-400 MG tablet Take 1 tablet by mouth daily.  . Multiple Vitamin (MULTIVITAMIN) tablet Take 1 tablet by mouth daily.  . pravastatin (PRAVACHOL) 20 MG tablet Take 1 tablet (20 mg total) by mouth at bedtime.  . Probiotic Product (PROBIOTIC DAILY PO) Take by mouth.   No facility-administered encounter medications on file as of 08/17/2019.    Allergies (verified) Codeine, Sulfa antibiotics, and Penicillins   History: Past Medical History:  Diagnosis Date  . Anxiety and depression   . Gait disorder   . Hyperlipidemia   . Neuropathy 08/04/2013  . Shoulder dislocation    h/o after a fall   Past Surgical History:  Procedure Laterality Date  . NO PAST SURGERIES     Family History  Problem Relation Age of Onset  . Heart disease Father        CAD?  Marland Kitchen Coronary artery disease Brother 44  . Heart disease Mother   . Breast cancer Neg Hx   . Colon cancer Neg Hx   . Diabetes Neg Hx   . Stroke Neg Hx    Social History   Socioeconomic History  . Marital status:  Married    Spouse name: Not on file  . Number of children: 1  . Years of education: Not on file  . Highest education level: Not on file  Occupational History  . Occupation: retired  Tobacco Use  . Smoking status: Never Smoker  . Smokeless tobacco: Never Used  Vaping Use  . Vaping Use: Never used  Substance and Sexual Activity  . Alcohol use: No  . Drug use: No  . Sexual activity: Not on file  Other Topics Concern  . Not on file  Social History Narrative   Lives w/  husband , share the house w/ daughter and one of her 2 children    Social Determinants of Health   Financial Resource Strain:   . Difficulty of Paying Living Expenses:   Food Insecurity:   . Worried About Programme researcher, broadcasting/film/video in the Last Year:   . Barista in the Last Year:   Transportation Needs:   . Freight forwarder (Medical):   Marland Kitchen Lack of Transportation (Non-Medical):   Physical Activity:   . Days of Exercise per Week:   . Minutes of Exercise per Session:   Stress:   . Feeling of Stress :   Social Connections:   . Frequency of Communication with Friends and Family:   . Frequency of Social Gatherings with Friends and Family:   . Attends Religious Services:   . Active Member of Clubs or Organizations:   . Attends Banker Meetings:   Marland Kitchen Marital Status:     Tobacco Counseling Counseling given: Not Answered   Clinical Intake: Pain : No/denies pain     Activities of Daily Living In your present state of health, do you have any difficulty performing the following activities: 08/17/2019  Hearing? N  Vision? N  Difficulty concentrating or making decisions? N  Walking or climbing stairs? N  Dressing or bathing? N  Doing errands, shopping? N  Preparing Food and eating ? N  Using the Toilet? N  In the past six months, have you accidently leaked urine? N  Do you have problems with loss of bowel control? N  Managing your Medications? N  Managing your Finances? N  Housekeeping or managing your Housekeeping? N  Some recent data might be hidden    Patient Care Team: Wanda Plump, MD as PCP - General (Internal Medicine) Ranee Gosselin, MD as Consulting Physician (Orthopedic Surgery) Vida Rigger, MD as Consulting Physician (Gastroenterology)  Indicate any recent Medical Services you may have received from other than [facility] providers in the past year (date may be approximate).     Assessment:   This is a routine wellness examination for  [place].  Dietary issues and exercise activities discussed: Current Exercise Habits: The patient does not participate in regular exercise at present, Exercise limited by: None identified Diet (meal preparation, eat out, water intake, caffeinated beverages, dairy products, fruits and vegetables): well balanced   Goals    . Decrease pepsi to 1 per day    . DIET - INCREASE WATER INTAKE      Depression Screen PHQ 2/9 Scores 08/17/2019 07/19/2019 05/09/2019 11/09/2018 08/15/2018 05/04/2018 11/03/2017  PHQ - 2 Score 1 3 0 2 0 0 0  PHQ- 9 Score - 19 0 3 - 1 -    Fall Risk Fall Risk  08/17/2019 05/09/2019 08/15/2018 11/03/2017 11/02/2016  Falls in the past year? 0 1 0 Yes No  Number falls in past yr: 0 0 -  2 or more -  Injury with Fall? 0 0 - No -  Risk for fall due to : - - - Medication side effect;Impaired balance/gait -  Follow up Education provided;Falls prevention discussed Falls evaluation completed - - -    Any stairs in or around the home? Yes  If so, are there any without handrails? No  Home free of loose throw rugs in walkways, pet beds, electrical cords, etc? Yes  Adequate lighting in your home to reduce risk of falls? Yes   ASSISTIVE DEVICES UTILIZED TO PREVENT FALLS:  Life alert? No  Use of a cane, walker or w/c? No  Grab bars in the bathroom? No  Shower chair or bench in shower? No  Elevated toilet seat or a handicapped toilet? No    Cognitive Function: Ad8 score reviewed for issues:  Issues making decisions:no  Less interest in hobbies / activities:no  Repeats questions, stories (family complaining):no  Trouble using ordinary gadgets (microwave, computer, phone):no  Forgets the month or year: no  Mismanaging finances: no  Remembering appts:no  Daily problems with thinking and/or memory:no Ad8 score is=0         Immunizations Immunization History  Administered Date(s) Administered  . Influenza Split 12/25/2011, 11/19/2018  . Influenza, High Dose Seasonal  PF 12/09/2012, 11/29/2015  . Influenza,inj,Quad PF,6+ Mos 12/08/2013  . Influenza-Unspecified 12/24/2014, 12/10/2016, 12/29/2017  . PFIZER SARS-COV-2 Vaccination 03/16/2019, 04/06/2019  . Pneumococcal Conjugate-13 08/04/2013  . Pneumococcal Polysaccharide-23 01/23/2010, 05/04/2018  . Td 04/27/2011  . Zoster 04/23/2009  . Zoster Recombinat (Shingrix) 11/03/2018    TDAP status: Up to date Flu Vaccine status: Up to date Pneumococcal vaccine status: Up to date Covid-19 vaccine status: Completed vaccines  Qualifies for Shingles Vaccine?   Zostavax completed Yes     Screening Tests Health Maintenance  Topic Date Due  . INFLUENZA VACCINE  09/24/2019  . TETANUS/TDAP  04/26/2021  . DEXA SCAN  Completed  . COVID-19 Vaccine  Completed  . PNA vac Low Risk Adult  Completed    Health Maintenance  There are no preventive care reminders to display for this patient.  Colorectal cancer screening: No longer required.  Mammogram status: Completed 11/16/18. Repeat every year Bone Density status: Completed 11/16/18. Results reflect: Bone density results: OSTEOPENIA. Repeat every 2 years.  Lung Cancer Screening: (Low Dose CT Chest recommended if Age 3-80 years, 30 pack-year currently smoking OR have quit w/in 15years.) does not qualify.    Additional Screening:  Hepatitis C Screening: does not qualify  Vision Screening: Recommended annual ophthalmology exams for early detection of glaucoma and other disorders of the eye. Is the patient up to date with their annual eye exam?  No  pt states she will schedule soon. Who is the provider or what is the name of the office in which the patient attends annual eye exams? Dr.McCuen   Dental Screening: Recommended annual dental exams for proper oral hygiene  Community Resource Referral / Chronic Care Management: CRR required this visit?  No   CCM required this visit?  No      Plan:    Please schedule your next medicare wellness visit with me  in 1 yr.  Continue to eat heart healthy diet (full of fruits, vegetables, whole grains, lean protein, water--limit salt, fat, and sugar intake) and increase physical activity as tolerated.  Continue doing brain stimulating activities (puzzles, reading, adult coloring books, staying active) to keep memory sharp.     I have personally reviewed and noted the  following in the patient's chart:   . Medical and social history . Use of alcohol, tobacco or illicit drugs  . Current medications and supplements . Functional ability and status . Nutritional status . Physical activity . Advanced directives . List of other physicians . Hospitalizations, surgeries, and ER visits in previous 12 months . Vitals . Screenings to include cognitive, depression, and falls . Referrals and appointments  In addition, I have reviewed and discussed with patient certain preventive protocols, quality metrics, and best practice recommendations. A written personalized care plan for preventive services as well as general preventive health recommendations were provided to patient.   Due to this being a telephonic visit, the after visit summary with patients personalized plan was offered to patient via mail or my-chart.  Patient would like to access on my-chart.   [place], [place]   08/17/2019   Nurse Notes: Lives with husband, dtr, and 2 grandchildren in 2 story home. Pt states her husband has some mild dementia.

## 2019-08-17 ENCOUNTER — Other Ambulatory Visit: Payer: Self-pay

## 2019-08-17 ENCOUNTER — Ambulatory Visit (INDEPENDENT_AMBULATORY_CARE_PROVIDER_SITE_OTHER): Payer: Medicare Other | Admitting: *Deleted

## 2019-08-17 ENCOUNTER — Encounter: Payer: Self-pay | Admitting: *Deleted

## 2019-08-17 DIAGNOSIS — Z0000 Encounter for general adult medical examination without abnormal findings: Secondary | ICD-10-CM | POA: Diagnosis not present

## 2019-08-17 NOTE — Patient Instructions (Addendum)
Andrea Martin , Thank you for taking time to come for your Medicare Wellness Visit. I appreciate your ongoing commitment to your health goals. Please review the following plan we discussed and let me know if I can assist you in the future.   Screening recommendations/referrals: Colorectal cancer screening: No longer required.  Mammogram status: Completed 11/16/18. Repeat every year Bone Density status: Completed 11/16/18. Results reflect: Bone density results: OSTEOPENIA. Repeat every 2 years. Recommended yearly ophthalmology/optometry visit for glaucoma screening and checkup Recommended yearly dental visit for hygiene and checkup  Vaccinations: TDAP status: Up to date Flu Vaccine status: Up to date Pneumococcal vaccine status: Up to date Covid-19 vaccine status: Completed vaccines  Advanced directives: Bring a copy of your living will and/or healthcare power of attorney to your next office visit.   Conditions/risks identified:   Next appointment: Follow up in one year for your annual wellness visit  6/27./22.   Preventive Care 78 Years and Older, Female Preventive care refers to lifestyle choices and visits with your health care provider that can promote health and wellness. What does preventive care include?  A yearly physical exam. This is also called an annual well check.  Dental exams once or twice a year.  Routine eye exams. Ask your health care provider how often you should have your eyes checked.  Personal lifestyle choices, including:  Daily care of your teeth and gums.  Regular physical activity.  Eating a healthy diet.  Avoiding tobacco and drug use.  Limiting alcohol use.  Practicing safe sex.  Taking low-dose aspirin every day.  Taking vitamin and mineral supplements as recommended by your health care provider. What happens during an annual well check? The services and screenings done by your health care provider during your annual well check will depend on  your age, overall health, lifestyle risk factors, and family history of disease. Counseling  Your health care provider may ask you questions about your:  Alcohol use.  Tobacco use.  Drug use.  Emotional well-being.  Home and relationship well-being.  Sexual activity.  Eating habits.  History of falls.  Memory and ability to understand (cognition).  Work and work Astronomer.  Reproductive health. Screening  You may have the following tests or measurements:  Height, weight, and BMI.  Blood pressure.  Lipid and cholesterol levels. These may be checked every 5 years, or more frequently if you are over 63 years old.  Skin check.  Lung cancer screening. You may have this screening every year starting at age 26 if you have a 30-pack-year history of smoking and currently smoke or have quit within the past 15 years.  Fecal occult blood test (FOBT) of the stool. You may have this test every year starting at age 49.  Flexible sigmoidoscopy or colonoscopy. You may have a sigmoidoscopy every 5 years or a colonoscopy every 10 years starting at age 27.  Hepatitis C blood test.  Hepatitis B blood test.  Sexually transmitted disease (STD) testing.  Diabetes screening. This is done by checking your blood sugar (glucose) after you have not eaten for a while (fasting). You may have this done every 1-3 years.  Bone density scan. This is done to screen for osteoporosis. You may have this done starting at age 79.  Mammogram. This may be done every 1-2 years. Talk to your health care provider about how often you should have regular mammograms. Talk with your health care provider about your test results, treatment options, and if necessary, the need  for more tests. Vaccines  Your health care provider may recommend certain vaccines, such as:  Influenza vaccine. This is recommended every year.  Tetanus, diphtheria, and acellular pertussis (Tdap, Td) vaccine. You may need a Td booster  every 10 years.  Zoster vaccine. You may need this after age 37.  Pneumococcal 13-valent conjugate (PCV13) vaccine. One dose is recommended after age 74.  Pneumococcal polysaccharide (PPSV23) vaccine. One dose is recommended after age 71. Talk to your health care provider about which screenings and vaccines you need and how often you need them. This information is not intended to replace advice given to you by your health care provider. Make sure you discuss any questions you have with your health care provider. Document Released: 03/08/2015 Document Revised: 10/30/2015 Document Reviewed: 12/11/2014 Elsevier Interactive Patient Education  2017 [place] Prevention in the Home Falls can cause injuries. They can happen to people of all ages. There are many things you can do to make your home safe and to help prevent falls. What can I do on the outside of my home?  Regularly fix the edges of walkways and driveways and fix any cracks.  Remove anything that might make you trip as you walk through a door, such as a raised step or threshold.  Trim any bushes or trees on the path to your home.  Use bright outdoor lighting.  Clear any walking paths of anything that might make someone trip, such as rocks or tools.  Regularly check to see if handrails are loose or broken. Make sure that both sides of any steps have handrails.  Any raised decks and porches should have guardrails on the edges.  Have any leaves, snow, or ice cleared regularly.  Use sand or salt on walking paths during winter.  Clean up any spills in your garage right away. This includes oil or grease spills. What can I do in the bathroom?  Use night lights.  Install grab bars by the toilet and in the tub and shower. Do not use towel bars as grab bars.  Use non-skid mats or decals in the tub or shower.  If you need to sit down in the shower, use a plastic, non-slip stool.  Keep the floor dry. Clean up any  water that spills on the floor as soon as it happens.  Remove soap buildup in the tub or shower regularly.  Attach bath mats securely with double-sided non-slip rug tape.  Do not have throw rugs and other things on the floor that can make you trip. What can I do in the bedroom?  Use night lights.  Make sure that you have a light by your bed that is easy to reach.  Do not use any sheets or blankets that are too big for your bed. They should not hang down onto the floor.  Have a firm chair that has side arms. You can use this for support while you get dressed.  Do not have throw rugs and other things on the floor that can make you trip. What can I do in the kitchen?  Clean up any spills right away.  Avoid walking on wet floors.  Keep items that you use a lot in easy-to-reach places.  If you need to reach something above you, use a strong step stool that has a grab bar.  Keep electrical cords out of the way.  Do not use floor polish or wax that makes floors slippery. If you must use wax, use  non-skid floor wax.  Do not have throw rugs and other things on the floor that can make you trip. What can I do with my stairs?  Do not leave any items on the stairs.  Make sure that there are handrails on both sides of the stairs and use them. Fix handrails that are broken or loose. Make sure that handrails are as long as the stairways.  Check any carpeting to make sure that it is firmly attached to the stairs. Fix any carpet that is loose or worn.  Avoid having throw rugs at the top or bottom of the stairs. If you do have throw rugs, attach them to the floor with carpet tape.  Make sure that you have a light switch at the top of the stairs and the bottom of the stairs. If you do not have them, ask someone to add them for you. What else can I do to help prevent falls?  Wear shoes that:  Do not have high heels.  Have rubber bottoms.  Are comfortable and fit you well.  Are closed  at the toe. Do not wear sandals.  If you use a stepladder:  Make sure that it is fully opened. Do not climb a closed stepladder.  Make sure that both sides of the stepladder are locked into place.  Ask someone to hold it for you, if possible.  Clearly mark and make sure that you can see:  Any grab bars or handrails.  First and last steps.  Where the edge of each step is.  Use tools that help you move around (mobility aids) if they are needed. These include:  Canes.  Walkers.  Scooters.  Crutches.  Turn on the lights when you go into a dark area. Replace any light bulbs as soon as they burn out.  Set up your furniture so you have a clear path. Avoid moving your furniture around.  If any of your floors are uneven, fix them.  If there are any pets around you, be aware of where they are.  Review your medicines with your doctor. Some medicines can make you feel dizzy. This can increase your chance of falling. Ask your doctor what other things that you can do to help prevent falls. This information is not intended to replace advice given to you by your health care provider. Make sure you discuss any questions you have with your health care provider. Document Released: 12/06/2008 Document Revised: 07/18/2015 Document Reviewed: 03/16/2014 Elsevier Interactive Patient Education  2017 [employer].

## 2019-08-28 ENCOUNTER — Encounter (HOSPITAL_BASED_OUTPATIENT_CLINIC_OR_DEPARTMENT_OTHER): Payer: Self-pay

## 2019-08-28 ENCOUNTER — Emergency Department (HOSPITAL_BASED_OUTPATIENT_CLINIC_OR_DEPARTMENT_OTHER)
Admission: EM | Admit: 2019-08-28 | Discharge: 2019-08-28 | Disposition: A | Discharge status: Home / Self Care | Payer: Medicare Other | Attending: Emergency Medicine | Admitting: Emergency Medicine

## 2019-08-28 ENCOUNTER — Other Ambulatory Visit: Payer: Self-pay

## 2019-08-28 DIAGNOSIS — H9311 Tinnitus, right ear: Secondary | ICD-10-CM | POA: Diagnosis not present

## 2019-08-28 DIAGNOSIS — R42 Dizziness and giddiness: Secondary | ICD-10-CM | POA: Diagnosis not present

## 2019-08-28 LAB — CBC
HCT: 40 % (ref 36.0–46.0)
Hemoglobin: 13.2 g/dL (ref 12.0–15.0)
MCH: 31.8 pg (ref 26.0–34.0)
MCHC: 33 g/dL (ref 30.0–36.0)
MCV: 96.4 fL (ref 80.0–100.0)
Platelets: 245 10*3/uL (ref 150–400)
RBC: 4.15 MIL/uL (ref 3.87–5.11)
RDW: 12.1 % (ref 11.5–15.5)
WBC: 4.4 10*3/uL (ref 4.0–10.5)
nRBC: 0 % (ref 0.0–0.2)

## 2019-08-28 LAB — BASIC METABOLIC PANEL
Anion gap: 6 (ref 5–15)
BUN: 20 mg/dL (ref 8–23)
CO2: 27 mmol/L (ref 22–32)
Calcium: 9.2 mg/dL (ref 8.9–10.3)
Chloride: 106 mmol/L (ref 98–111)
Creatinine, Ser: 0.98 mg/dL (ref 0.44–1.00)
GFR calc Af Amer: >60 mL/min (ref >60)
GFR calc non Af Amer: 54 mL/min — ABNORMAL LOW (ref >60)
Glucose, Bld: 94 mg/dL (ref 70–99)
Potassium: 4.1 mmol/L (ref 3.5–5.1)
Sodium: 139 mmol/L (ref 135–145)

## 2019-08-28 MED ORDER — MECLIZINE HCL 25 MG PO TABS: 12.5000 mg | ORAL_TABLET | Freq: Three times a day (TID) | ORAL | 0 refills | Status: DC | PRN

## 2019-08-28 MED FILL — MECLIZINE 25 MG TABLET: 25 | 4 days supply | Qty: 10 | Fill #0

## 2019-08-28 NOTE — ED Triage Notes (Signed)
Pt arrives with c/o dizziness since Friday, states that she has not fallen but has had to hold onto things to keep her from falling. Denies any history of vertigo. Denies numbness, speech changes, or weakness.

## 2019-08-28 NOTE — Discharge Instructions (Signed)
Follow-up with ear nose and throat for further evaluation treatment of the vertigo.  Return for worsening symptoms.

## 2019-08-28 NOTE — ED Provider Notes (Signed)
Was like [facility] EMERGENCY DEPARTMENT Provider Note   CSN: [id number] Arrival date & time: 08/28/19  1212     History Chief Complaint  Patient presents with   Dizziness    Andrea Martin is a 84 y.o. female.  HPI Patient presents with dizziness.  Began around 2 days ago.  States it feels that things are moving around.  Worse when she bent forward and when she turns her head.  No headache.  No confusion.  States she does feel a fullness in her ears, particular right ear.  Does hear some ringing in the ears 2.  No numbness or weakness.  Has not had vertigo previously.  No injury.  She is a little unsteady with it.  Feels that should be able ambulate at home if needed however.  Mild nausea.    Past Medical History:  Diagnosis Date   Anxiety and depression    Gait disorder    Hyperlipidemia    Neuropathy 08/04/2013   Shoulder dislocation    h/o after a fall    Patient Active Problem List   Diagnosis Date Noted   Tremor 08/11/2017   Essential tremor 11/29/2015   PCP NOTES >>>>>>>>>>>>>>>>>>>>>>>> 06/04/2015   Neuropathy 08/04/2013   Annual physical exam 08/01/2012   Hyperlipidemia 11/03/2011   Anxiety and depression 11/03/2011   Gait disorder 11/03/2011    Past Surgical History:  Procedure Laterality Date   NO PAST SURGERIES       OB History   No obstetric history on file.     Family History  Problem Relation Age of Onset   Heart disease Father        CAD?   Coronary artery disease Brother 50   Heart disease Mother    Breast cancer Neg Hx    Colon cancer Neg Hx    Diabetes Neg Hx    Stroke Neg Hx     Social History   Tobacco Use   Smoking status: Never Smoker   Smokeless tobacco: Never Used  Vaping Use   Vaping Use: Never used  Substance Use Topics   Alcohol use: No   Drug use: No    Home Medications Prior to Admission medications   Medication Sig Start Date End Date Taking? Authorizing Provider   ALPRAZolam Prudy Feeler) 0.5 MG tablet Take 0.5-1 tablets (0.25-0.5 mg total) by mouth 3 (three) times daily as needed for anxiety. 07/19/19   Wanda Plump, MD  azelastine (ASTELIN) 0.1 % nasal spray Place 2 sprays into both nostrils at bedtime as needed for rhinitis. Use in each nostril as directed 12/15/17   Wanda Plump, MD  buPROPion [facility] SR) 150 MG 12 hr tablet Take 2 tablets (300 mg total) by mouth daily. 06/07/19   Wanda Plump, MD  calcium carbonate (OS-CAL) 600 MG TABS Take 600 mg by mouth daily.    [provider]  Cholecalciferol (VITAMIN D-3) 1000 UNITS CAPS Take 1 capsule by mouth daily.    [provider]  escitalopram (LEXAPRO) 20 MG tablet Take 1 tablet (20 mg total) by mouth daily. 06/07/19   Wanda Plump, MD  fish oil-omega-3 fatty acids 1000 MG capsule Take 1 g by mouth daily.    [provider]  glucosamine-chondroitin 500-400 MG tablet Take 1 tablet by mouth daily.    [provider]  meclizine (ANTIVERT) 25 MG tablet Take 0.5-1 tablets (12.5-25 mg total) by mouth 3 (three) times daily as needed for dizziness. 08/28/19  Benjiman Core, MD  Multiple Vitamin (MULTIVITAMIN) tablet Take 1 tablet by mouth daily.    [provider]  pravastatin (PRAVACHOL) 20 MG tablet Take 1 tablet (20 mg total) by mouth at bedtime. 05/02/19   Wanda Plump, MD  Probiotic Product (PROBIOTIC DAILY PO) Take by mouth.    [provider]    Allergies    Codeine, Sulfa antibiotics, and Penicillins  Review of Systems   Review of Systems  Constitutional: Negative for appetite change.  HENT: Negative for congestion.   Respiratory: Negative for shortness of breath.   Cardiovascular: Negative for chest pain.  Gastrointestinal: Positive for nausea.  Genitourinary: Negative for flank pain.  Musculoskeletal: Negative for back pain.  Skin: Negative for rash.  Neurological: Negative for dizziness.       Vertigo    Physical Exam Updated Vital Signs BP  (!) 175/74 (BP Location: Left Arm)    Pulse (!) 53    Temp 98.3 F (36.8 C) (Oral)    Resp 16    Ht 5\' 6"  (1.676 m)    Wt 72.6 kg    SpO2 99%    BMI 25.82 kg/m   Physical Exam Vitals and nursing note reviewed.  HENT:     Head: Atraumatic.     Right Ear: Tympanic membrane normal.     Left Ear: Tympanic membrane normal.  Eyes:     Extraocular Movements: Extraocular movements intact.     Pupils: Pupils are equal, round, and reactive to light.     Comments: No nystagmus.  Cardiovascular:     Rate and Rhythm: Regular rhythm.  Pulmonary:     Effort: Pulmonary effort is normal.  Abdominal:     Tenderness: There is no abdominal tenderness.  Musculoskeletal:        General: No tenderness.  Skin:    General: Skin is warm.     Capillary Refill: Capillary refill takes less than 2 seconds.  Neurological:     Mental Status: She is alert and oriented to person, place, and time.     Comments: Finger-nose intact bilaterally.     ED Results / Procedures / Treatments   Labs (all labs ordered are listed, but only abnormal results are displayed) Labs Reviewed  BASIC METABOLIC PANEL - Abnormal; Notable for the following components:      Result Value   GFR calc non Af Amer 54 (*)    All other components within normal limits  CBC    EKG EKG Interpretation  Date/Time:  Monday August 28 2019 12:36:15 EDT Ventricular Rate:  56 PR Interval:  150 QRS Duration: 70 QT Interval:  432 QTC Calculation: 416 R Axis:   17 Text Interpretation: Sinus bradycardia Otherwise normal ECG Confirmed by [id number] [phone]) on 08/28/2019 1:03:49 PM Also confirmed by 10/29/2019 [phone])  on 08/28/2019 1:13:35 PM   Radiology No results found.  Procedures Procedures (including critical care time)  Medications Ordered in ED Medications - No data to display  ED Course  I have reviewed the triage vital signs and the nursing notes.  Pertinent labs & imaging results that were available during my  care of the patient were reviewed by me and considered in my medical decision making (see chart for details).    MDM Rules/Calculators/A&P                          Patient presents with vertigo.  The peripheral since she has the  ringing and fullness in her ears with it.  Able to ambulate.  Central felt less likely.  Lab work reassuring.  Discharge home with outpatient follow-up.  Given symptomatic treatment with Antivert, although discussion with patient about risks and benefits of this medicine Final Clinical Impression(s) / ED Diagnoses Final diagnoses:  Vertigo    Rx / DC Orders ED Discharge Orders         Ordered    meclizine (ANTIVERT) 25 MG tablet  3 times daily PRN     Discontinue  Reprint     08/28/19 1432           Benjiman Core, MD 08/28/19 1514

## 2019-11-01 ENCOUNTER — Telehealth: Payer: Self-pay | Admitting: Internal Medicine

## 2019-11-01 ENCOUNTER — Encounter: Payer: Self-pay | Admitting: Internal Medicine

## 2019-11-01 NOTE — Telephone Encounter (Signed)
Caller: Marshall Call back # [phone]  Patient states she has been experiencing some dizziness and low blood pressure and would like your opinion. I offer her an appointment, but she would like to wait into she speaks with you.

## 2019-11-01 NOTE — Telephone Encounter (Signed)
I agree, she will need to be seen please.

## 2019-11-03 ENCOUNTER — Ambulatory Visit (INDEPENDENT_AMBULATORY_CARE_PROVIDER_SITE_OTHER): Payer: Medicare Other | Admitting: Medical

## 2019-11-03 ENCOUNTER — Encounter: Payer: Self-pay | Admitting: Medical

## 2019-11-03 ENCOUNTER — Other Ambulatory Visit: Payer: Self-pay

## 2019-11-03 VITALS — BP 165/75 | HR 59 | Temp 98.2°F | Resp 18 | Ht 66.0 in | Wt 163.8 lb

## 2019-11-03 DIAGNOSIS — I10 Essential (primary) hypertension: Secondary | ICD-10-CM

## 2019-11-03 MED ORDER — MECLIZINE HCL 12.5 MG PO TABS: 12.5000 mg | ORAL_TABLET | Freq: Three times a day (TID) | ORAL | 0 refills | Status: DC | PRN

## 2019-11-03 MED ORDER — HYDROCHLOROTHIAZIDE 12.5 MG PO CAPS: 12.5000 mg | ORAL_CAPSULE | Freq: Every day | ORAL | 0 refills | Status: DC

## 2019-11-03 MED ORDER — LOSARTAN POTASSIUM 50 MG PO TABS
50.0000 mg | ORAL_TABLET | Freq: Every day | ORAL | 0 refills | Status: DC
Start: 2019-11-03 — End: 2019-12-19

## 2019-11-03 MED FILL — LOSARTAN POTASSIUM 50 MG TA: 50 | 30 days supply | Qty: 30 | Fill #0

## 2019-11-03 MED FILL — HYDROCHLOROTHIAZIDE 12.5 MG: 12.5 | 30 days supply | Qty: 30 | Fill #0

## 2019-11-03 MED FILL — MECLIZINE 12.5 MG CAPLET: 12.5 | 10 days supply | Qty: 30 | Fill #0

## 2019-11-03 NOTE — Patient Instructions (Addendum)
You have history of intermittent dizziness with some vertigo over the past 2 months.  On review of ED visit and recent blood pressure readings your blood pressure does look elevated most of the time.  Sometimes moderate to severe elevation which does cause concern.  Today on various BP checks by myself your blood pressure range in the 170-165/75 range.  You had good neurologic exam presently.  No dizziness or headache during the interview.  I do want you to start losartan 50mg  tablet today.  I am making HCTZ diuretic available to print prescription if your blood pressures are worsening rather than improving over the weekend.(do not think ct imaging indicated today)  If your blood pressures do not improve or you get motor or neurologic signs symptoms as discussed then recommend ED evaluation.  This is very important.  Please get CBC and CMP today.  I wanted to get you scheduled for follow-up on Monday but it looks like both my schedule and Dr. Tuesday schedule as well.  So please get scheduled for Tuesday.  Also making meclizine available for dizziness.  Recommend only using meclizine if he were to get dizzy that persist past 5 minutes.  Do not recommend using it for transient dizziness.

## 2019-11-03 NOTE — Progress Notes (Signed)
Subjective:    Patient ID: Andrea Martin, female    DOB: September 28, 1935, 84 y.o.   MRN: [id number]  HPI  Pt in for recent hx of dizziness. Does report hx of vertigo intermittent. 2 night ago she had that briefly.   Pt seen in ED on 08-28-2019. That day  hpi. Patient presents with dizziness.  Began around 2 days ago.  States it feels that things are moving around.  Worse when she bent forward and when she turns her head.  No headache.  No confusion.  States she does feel a fullness in her ears, particular right ear.  Does hear some ringing in the ears 2.  No numbness or weakness.  Has not had vertigo previously.  No injury.  She is a little unsteady with it.  Feels that should be able ambulate at home if needed however.  Mild nausea.  That day in ED 175/74. Pt is not on any blood pressure medicine presently on review of med list.   Pt states her dizziness went away from ED for most part. States would have rare light headed sensation. But then 2 weeks ago dizziness with intermixed vertigo. Most of last 2 weeks vertigo. No episodes of slurred speech,  No weakness in hands of feet, no visual disturbance and no confusion. Pt does report medium level ha. No nausea or vomiting.   No syncope. On review she did have fall after leaning over to pick something off floor. No head trauma.  Pt states recent higher level with husband who just got diagnosed with dementia. Pt concerned about future.  Pt bp readings since 10/28/2019 149/51, 111/48, 134/56, 151/70, 160/69, 159/70, 170,68, 149/70, 170/65, 149/70, 137/68, 152/64, 140/62, 159/70, and 154/67.      A/P day of ED visit stated. "Patient presents with vertigo.  The peripheral since she has the ringing and fullness in her ears with it.  Able to ambulate.  Central felt less likely.  Lab work reassuring.  Discharge home with outpatient follow-up.  Given symptomatic treatment with Antivert, although discussion with patient about risks and benefits of this  medicine"  No bp med given that day. Did give antivert.     Review of Systems  Constitutional: Negative for chills, fatigue and fever.  HENT: Negative for congestion, ear discharge and ear pain.   Respiratory: Negative for cough, chest tightness, shortness of breath and wheezing.   Cardiovascular: Negative for chest pain and palpitations.  Gastrointestinal: Negative for abdominal pain, constipation, diarrhea and vomiting.  Genitourinary: Negative for dysuria.  Musculoskeletal: Negative for back pain.  Skin: Negative for rash.  Neurological: Positive for dizziness, light-headedness and headaches. Negative for syncope and weakness.       Presntly pt is not dizzy. Only transient this monring when bent over quicly to pick something up then resolved in a second.  No ha presently. Comes and goes.  Psychiatric/Behavioral: Negative for behavioral problems, decreased concentration and suicidal ideas.       Some stress.    Past Medical History:  Diagnosis Date   Anxiety and depression    Gait disorder    Hyperlipidemia    Neuropathy 08/04/2013   Shoulder dislocation    h/o after a fall     Social History   Socioeconomic History   Marital status: Married    Spouse name: Not on file   Number of children: 1   Years of education: Not on file   Highest education level: Not on file  Occupational  History   Occupation: retired  Tobacco Use   Smoking status: Never Smoker   Smokeless tobacco: Never Used  Building services engineer Use: Never used  Substance and Sexual Activity   Alcohol use: No   Drug use: No   Sexual activity: Not on file  Other Topics Concern   Not on file  Social History Narrative   Lives w/ husband , share the house w/ daughter and one of her 2 children    Social Determinants of Health   Financial Resource Strain: Low Risk    Difficulty of Paying Living Expenses: Not hard at all  Food Insecurity: No Food Insecurity   Worried About Patent examiner in the Last Year: Never true   Barista in the Last Year: Never true  Transportation Needs: No Transportation Needs   Lack of Transportation (Medical): No   Lack of Transportation (Non-Medical): No  Physical Activity:    Days of Exercise per Week: Not on file   Minutes of Exercise per Session: Not on file  Stress:    Feeling of Stress : Not on file  Social Connections:    Frequency of Communication with Friends and Family: Not on file   Frequency of Social Gatherings with Friends and Family: Not on file   Attends Religious Services: Not on file   Active Member of Clubs or Organizations: Not on file   Attends Banker Meetings: Not on file   Marital Status: Not on file  Intimate Partner Violence:    Fear of Current or Ex-Partner: Not on file   Emotionally Abused: Not on file   Physically Abused: Not on file   Sexually Abused: Not on file    Past Surgical History:  Procedure Laterality Date   NO PAST SURGERIES      Family History  Problem Relation Age of Onset   Heart disease Father        CAD?   Coronary artery disease Brother 50   Heart disease Mother    Breast cancer Neg Hx    Colon cancer Neg Hx    Diabetes Neg Hx    Stroke Neg Hx     Allergies  Allergen Reactions   Codeine Nausea And Vomiting   Sulfa Antibiotics     Was told allergic to sulfa does not remember why   Penicillins Rash    Current Outpatient Medications on File Prior to Visit  Medication Sig Dispense Refill   ALPRAZolam (XANAX) 0.5 MG tablet Take 0.5-1 tablets (0.25-0.5 mg total) by mouth 3 (three) times daily as needed for anxiety. 30 tablet 0   azelastine (ASTELIN) 0.1 % nasal spray Place 2 sprays into both nostrils at bedtime as needed for rhinitis. Use in each nostril as directed 30 mL 3   buPROPion (WELLBUTRIN SR) 150 MG 12 hr tablet Take 2 tablets (300 mg total) by mouth daily. 180 tablet 1   calcium carbonate (OS-CAL) 600 MG TABS  Take 600 mg by mouth daily.     Cholecalciferol (VITAMIN D-3) 1000 UNITS CAPS Take 1 capsule by mouth daily.     escitalopram (LEXAPRO) 20 MG tablet Take 1 tablet (20 mg total) by mouth daily. 90 tablet 1   fish oil-omega-3 fatty acids 1000 MG capsule Take 1 g by mouth daily.     glucosamine-chondroitin 500-400 MG tablet Take 1 tablet by mouth daily.     meclizine (ANTIVERT) 25 MG tablet Take 0.5-1 tablets (12.5-25 mg total)  by mouth 3 (three) times daily as needed for dizziness. 10 tablet 0   Multiple Vitamin (MULTIVITAMIN) tablet Take 1 tablet by mouth daily.     pravastatin (PRAVACHOL) 20 MG tablet Take 1 tablet (20 mg total) by mouth at bedtime. 90 tablet 3   Probiotic Product (PROBIOTIC DAILY PO) Take by mouth.     No current facility-administered medications on file prior to visit.    BP (!) 180/63    Pulse (!) 59    Temp 98.2 F (36.8 C) (Oral)    Resp 18    Ht 5\' 6"  (1.676 m)    Wt 163 lb 12.8 oz (74.3 kg)    SpO2 98%    BMI 26.44 kg/m      Objective:   Physical Exam  General Mental Status- Alert. General Appearance- Not in acute distress.   Skin General: Color- Normal Color. Moisture- Normal Moisture.  Neck Carotid Arteries- Normal color. Moisture- Normal Moisture. No JVD.  Chest and Lung Exam Auscultation: Breath Sounds:-Normal.  Cardiovascular Auscultation:Rythm- Regular. Murmurs & Other Heart Sounds:Auscultation of the heart reveals- No Murmurs.  Abdomen Inspection:-Inspeection Normal. Palpation/Percussion:Note:No mass. Palpation and Percussion of the abdomen reveal- Non Tender, Non Distended + BS, no rebound or guarding.   Neurologic Cranial Nerve exam:- CN III-XII intact(No nystagmus), symmetric smile. Drift Test:- No drift. Romberg Exam:- Negative.  Heal to Toe Gait exam:-took 2 steps well then started to loose balance. Finger to Nose:- Normal/Intact Strength:- 5/5 equal and symmetric strength both upper and lower extremities.        Assessment & Plan:  You have history of intermittent dizziness with some vertigo over the past 2 months.  On review of ED visit and recent blood pressure readings your blood pressure does look elevated most of the time.  Sometimes moderate to severe elevation which does cause concern.  Today on various BP checks by myself your blood pressure range in the 170-165/75 range.  You had good neurologic exam presently.  No dizziness or headache during the interview.  I do want you to start losartan 50mg  tablet today.  I am making HCTZ diuretic available to print prescription if your blood pressures are worsening rather than improving over the weekend.(do not think ct imaging indicated today)  If your blood pressures do not improve or you get motor or neurologic signs symptoms as discussed then recommend ED evaluation.  This is very important.  Please get CBC and CMP today.  I wanted to get you scheduled for follow-up on Monday but it looks like both my schedule and Dr. schedule as well.  So please get scheduled for Tuesday.  Also making meclizine available for dizziness.  Recommend only using meclizine if he were to get dizzy that persist past 5 minutes.  Do not recommend using it for transient dizziness.  Time spent with patient today was 45  minutes which consisted of chart review, discussing diagnosis, work up, treatment, explain plan going forwared and documentation.

## 2019-11-04 LAB — CBC WITH DIFFERENTIAL/PLATELET
Absolute Monocytes: 485 cells/uL (ref 200–950)
Basophils Absolute: 29 cells/uL (ref 0–200)
Basophils Relative: 0.6 %
Eosinophils Absolute: 162 cells/uL (ref 15–500)
Eosinophils Relative: 3.3 %
HCT: 40 % (ref 35.0–45.0)
Hemoglobin: 13.4 g/dL (ref 11.7–15.5)
Lymphs Abs: 1857 cells/uL (ref 850–3900)
MCH: 32.5 pg (ref 27.0–33.0)
MCHC: 33.5 g/dL (ref 32.0–36.0)
MCV: 97.1 fL (ref 80.0–100.0)
MPV: 10.8 fL (ref 7.5–12.5)
Monocytes Relative: 9.9 %
Neutro Abs: 2367 cells/uL (ref 1500–7800)
Neutrophils Relative %: 48.3 %
Platelets: 259 10*3/uL (ref 140–400)
RBC: 4.12 10*6/uL (ref 3.80–5.10)
RDW: 12 % (ref 11.0–15.0)
Total Lymphocyte: 37.9 %
WBC: 4.9 10*3/uL (ref 3.8–10.8)

## 2019-11-04 LAB — COMPREHENSIVE METABOLIC PANEL
AG Ratio: 1.7 (calc) (ref 1.0–2.5)
ALT: 17 U/L (ref 6–29)
AST: 22 U/L (ref 10–35)
Albumin: 3.9 g/dL (ref 3.6–5.1)
Alkaline phosphatase (APISO): 60 U/L (ref 37–153)
BUN/Creatinine Ratio: 19 (calc) (ref 6–22)
BUN: 19 mg/dL (ref 7–25)
CO2: 26 mmol/L (ref 20–32)
Calcium: 9.7 mg/dL (ref 8.6–10.4)
Chloride: 109 mmol/L (ref 98–110)
Creat: 0.98 mg/dL — ABNORMAL HIGH (ref 0.60–0.88)
Globulin: 2.3 g/dL (calc) (ref 1.9–3.7)
Glucose, Bld: 86 mg/dL (ref 65–99)
Potassium: 4.5 mmol/L (ref 3.5–5.3)
Sodium: 143 mmol/L (ref 135–146)
Total Bilirubin: 0.5 mg/dL (ref 0.2–1.2)
Total Protein: 6.2 g/dL (ref 6.1–8.1)

## 2019-11-07 ENCOUNTER — Encounter: Payer: Self-pay | Admitting: Internal Medicine

## 2019-11-07 ENCOUNTER — Other Ambulatory Visit: Payer: Self-pay

## 2019-11-07 ENCOUNTER — Ambulatory Visit (INDEPENDENT_AMBULATORY_CARE_PROVIDER_SITE_OTHER): Payer: Medicare Other | Admitting: Internal Medicine

## 2019-11-07 VITALS — BP 114/63 | HR 56 | Temp 98.1°F | Resp 16 | Ht 66.0 in | Wt 163.5 lb

## 2019-11-07 DIAGNOSIS — G4452 New daily persistent headache (NDPH): Secondary | ICD-10-CM

## 2019-11-07 DIAGNOSIS — R42 Dizziness and giddiness: Secondary | ICD-10-CM

## 2019-11-07 LAB — SEDIMENTATION RATE: Sed Rate: 9 mm/h (ref 0–30)

## 2019-11-07 NOTE — Progress Notes (Signed)
Subjective:    Patient ID: Andrea Martin, female    DOB: Jun 15, 1935, 84 y.o.   MRN: [id number]  DOS:  11/07/2019 Type of visit - description: Follow-up  Went to the ER 08/28/2019, she had dizziness described as things moving around worse when she bent forward or turns her head.  No headache, no confusion.  On exam there were no nystagmus, exam was benign, Rx Antivert  Subsequently, was seen at this office 11/03/2019,  neuro exam symmetric except for some loss of balance with ambulation.. BP was slightly elevated. CMP, CBC were normal   Review of Systems As described above, she is having dizziness since 08-2019. Is typically triggered by bending or turning in bed. I ask about a headache and she said that has developed a headache for the last 3 weeks: This is something essentially new to her, described as a fullness at the head, not only her sinuses  Denies fever chills No jaw claudication No weight loss  Past Medical History:  Diagnosis Date  . Anxiety and depression   . Gait disorder   . Hyperlipidemia   . Neuropathy 08/04/2013  . Shoulder dislocation    h/o after a fall    Past Surgical History:  Procedure Laterality Date  . NO PAST SURGERIES      Allergies as of 11/07/2019      Reactions   Codeine Nausea And Vomiting   Sulfa Antibiotics    Was told allergic to sulfa does not remember why   Penicillins Rash      Medication List       Accurate as of November 07, 2019  9:14 AM. If you have any questions, ask your nurse or doctor.        ALPRAZolam 0.5 MG tablet Commonly known as: XANAX Take 0.5-1 tablets (0.25-0.5 mg total) by mouth 3 (three) times daily as needed for anxiety.   azelastine 0.1 % nasal spray Commonly known as: ASTELIN Place 2 sprays into both nostrils at bedtime as needed for rhinitis. Use in each nostril as directed   buPROPion 150 MG 12 hr tablet Commonly known as: WELLBUTRIN SR Take 2 tablets (300 mg total) by mouth daily.   calcium  carbonate 600 MG Tabs tablet Commonly known as: OS-CAL Take 600 mg by mouth daily.   escitalopram 20 MG tablet Commonly known as: LEXAPRO Take 1 tablet (20 mg total) by mouth daily.   fish oil-omega-3 fatty acids 1000 MG capsule Take 1 g by mouth daily.   glucosamine-chondroitin 500-400 MG tablet Take 1 tablet by mouth daily.   hydrochlorothiazide 12.5 MG capsule Commonly known as: Microzide Take 1 capsule (12.5 mg total) by mouth daily.   losartan 50 MG tablet Commonly known as: COZAAR Take 1 tablet (50 mg total) by mouth daily.   meclizine 25 MG tablet Commonly known as: ANTIVERT Take 0.5-1 tablets (12.5-25 mg total) by mouth 3 (three) times daily as needed for dizziness.   meclizine 12.5 MG tablet Commonly known as: ANTIVERT Take 1 tablet (12.5 mg total) by mouth 3 (three) times daily as needed for dizziness.   multivitamin tablet Take 1 tablet by mouth daily.   pravastatin 20 MG tablet Commonly known as: PRAVACHOL Take 1 tablet (20 mg total) by mouth at bedtime.   PROBIOTIC DAILY PO Take by mouth.   Vitamin D-3 25 MCG (1000 UT) Caps Take 1 capsule by mouth daily.          Objective:   Physical Exam BP 114/63 (  BP Location: Left Arm, Patient Position: Sitting, Cuff Size: Small)   Pulse (!) 56   Temp 98.1 F (36.7 C) (Oral)   Resp 16   Ht 5\' 6"  (1.676 m)   Wt 163 lb 8 oz (74.2 kg)   SpO2 97%   BMI 26.39 kg/m  General:   Well developed, NAD, BMI noted. HEENT:  Normocephalic . Face symmetric, atraumatic Neck: Normal carotid pulses Lungs:  CTA B Normal respiratory effort, no intercostal retractions, no accessory muscle use. Heart: RRR,  no murmur.  Lower extremities: no pretibial edema bilaterally  Skin: Not pale. Not jaundice Neurologic:  alert & oriented X3.  Speech normal, gait appropriate for age and unassisted Motor and DTRs: Symmetric (both ankle jerks decreased) Romberg absent Psych--  Cognition and judgment appear intact.  Cooperative  with normal attention span and concentration.  Behavior appropriate. No anxious or depressed appearing.      Assessment     Assessment   Hyperlipidemia.  Simvastatin intolerant.  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 Recommend PT referral for vestibular rehabilitation. Headache: New onset, started 3 weeks ago, "probably the worst of my life".  Rx a sed rate and a brain MRI. Anxiety, depression: Started Xanax 07/19/2019, has taken only few tabs, doubt benzos are causing dizziness or any problems. Patient education: Encouraged not to send urgent MyChart messages, urgent matters must be managed by phone calls. RTC 6 weeks    This visit occurred during the SARS-CoV-2 public health emergency.  Safety protocols were in place, including screening questions prior to the visit, additional usage of staff PPE, and extensive cleaning of exam room while observing appropriate contact time as indicated for disinfecting solutions.

## 2019-11-07 NOTE — Progress Notes (Signed)
Pre visit review using our clinic review tool, if applicable. No additional management support is needed unless otherwise documented below in the visit note. 

## 2019-11-07 NOTE — Patient Instructions (Signed)
We are referring you to a physical therapist for "vestibular rehabilitation"  We are arranging for a brain MRI  Call if you have increased dizziness or any unusual or severe headache.   GO TO THE LAB : Get the blood work     GO TO THE FRONT DESK, PLEASE SCHEDULE YOUR APPOINTMENTS Come back for a checkup in 6 weeks

## 2019-11-08 NOTE — Assessment & Plan Note (Signed)
Dizziness: As described above, going on for few weeks already, features point to peripheral tissue. Recommend PT referral for vestibular rehabilitation. Headache: New onset, started 3 weeks ago, "probably the worst of my life".  Rx a sed rate and a brain MRI. Anxiety, depression: Started Xanax 07/19/2019, has taken only few tabs, doubt benzos are causing dizziness or any problems. Patient education: Encouraged not to send urgent MyChart messages, urgent matters must be managed by phone calls. RTC 6 weeks

## 2019-11-11 ENCOUNTER — Ambulatory Visit (HOSPITAL_BASED_OUTPATIENT_CLINIC_OR_DEPARTMENT_OTHER)
Admission: RE | Admit: 2019-11-11 | Discharge: 2019-11-11 | Disposition: A | Discharge status: Home / Self Care | Payer: Medicare Other | Source: Ambulatory Visit | Attending: Internal Medicine | Admitting: Internal Medicine

## 2019-11-11 ENCOUNTER — Other Ambulatory Visit: Payer: Self-pay

## 2019-11-11 DIAGNOSIS — R42 Dizziness and giddiness: Secondary | ICD-10-CM | POA: Diagnosis not present

## 2019-11-11 DIAGNOSIS — I6389 Other cerebral infarction: Secondary | ICD-10-CM | POA: Diagnosis not present

## 2019-11-11 DIAGNOSIS — G4452 New daily persistent headache (NDPH): Secondary | ICD-10-CM | POA: Insufficient documentation

## 2019-11-11 DIAGNOSIS — R9082 White matter disease, unspecified: Secondary | ICD-10-CM | POA: Diagnosis not present

## 2019-11-13 DIAGNOSIS — H8193 Unspecified disorder of vestibular function, bilateral: Secondary | ICD-10-CM | POA: Diagnosis not present

## 2019-11-13 DIAGNOSIS — M6281 Muscle weakness (generalized): Secondary | ICD-10-CM | POA: Diagnosis not present

## 2019-11-13 DIAGNOSIS — H81393 Other peripheral vertigo, bilateral: Secondary | ICD-10-CM | POA: Diagnosis not present

## 2019-11-13 DIAGNOSIS — R42 Dizziness and giddiness: Secondary | ICD-10-CM | POA: Diagnosis not present

## 2019-11-15 ENCOUNTER — Telehealth: Payer: Self-pay

## 2019-11-15 NOTE — Telephone Encounter (Signed)
Plan of care signed and faxed back to [facility]  PT at [phone]. Form sent for scanning.

## 2019-11-19 ENCOUNTER — Other Ambulatory Visit: Payer: Self-pay | Admitting: Internal Medicine

## 2019-11-20 ENCOUNTER — Encounter: Payer: Medicare Other | Admitting: Internal Medicine

## 2019-12-19 ENCOUNTER — Other Ambulatory Visit: Payer: Self-pay

## 2019-12-19 ENCOUNTER — Other Ambulatory Visit: Payer: Self-pay | Admitting: Internal Medicine

## 2019-12-19 ENCOUNTER — Ambulatory Visit (INDEPENDENT_AMBULATORY_CARE_PROVIDER_SITE_OTHER): Payer: Medicare Other | Admitting: Internal Medicine

## 2019-12-19 ENCOUNTER — Encounter: Payer: Self-pay | Admitting: Internal Medicine

## 2019-12-19 VITALS — BP 133/72 | HR 54 | Temp 97.7°F | Resp 18 | Ht 66.0 in | Wt 164.4 lb

## 2019-12-19 DIAGNOSIS — F32A Depression, unspecified: Secondary | ICD-10-CM

## 2019-12-19 DIAGNOSIS — Z79899 Other long term (current) drug therapy: Secondary | ICD-10-CM | POA: Diagnosis not present

## 2019-12-19 DIAGNOSIS — I10 Essential (primary) hypertension: Secondary | ICD-10-CM | POA: Diagnosis not present

## 2019-12-19 DIAGNOSIS — R42 Dizziness and giddiness: Secondary | ICD-10-CM

## 2019-12-19 DIAGNOSIS — Z8673 Personal history of transient ischemic attack (TIA), and cerebral infarction without residual deficits: Secondary | ICD-10-CM

## 2019-12-19 DIAGNOSIS — Z23 Encounter for immunization: Secondary | ICD-10-CM

## 2019-12-19 DIAGNOSIS — F419 Anxiety disorder, unspecified: Secondary | ICD-10-CM

## 2019-12-19 MED ORDER — LOSARTAN POTASSIUM 50 MG PO TABS
50.0000 mg | ORAL_TABLET | Freq: Every day | ORAL | 1 refills | Status: DC
Start: 2019-12-19 — End: 2020-01-02

## 2019-12-19 MED FILL — LOSARTAN POTASSIUM 50 MG TA: 50 | 30 days supply | Qty: 30 | Fill #0

## 2019-12-19 NOTE — Patient Instructions (Addendum)
Start losartan 50 mg: 1 tablet every night  Check the  blood pressure 2 or 3 times a week BP GOAL is between 110/65 and  135/85. If it is consistently higher or lower, let me know  Please proceed with your Covid booster .   If you have any sinus issue, try Flonase over-the-counter: 2 sprays on each side of the nose daily  GO TO THE LAB : Provide a urine sample  GO TO THE FRONT DESK, PLEASE SCHEDULE YOUR APPOINTMENTS Come back for   blood work in 2 weeks  Come back for a checkup in 4 months

## 2019-12-19 NOTE — Progress Notes (Signed)
Subjective:    Patient ID: Andrea Martin, female    DOB: 20-Oct-1935, 84 y.o.   MRN: [id number]  DOS:  12/19/2019 Type of visit - description: Follow-up  Was seen recently with a headache, dizziness: work-up reviewed and discussed with the patient She is now much improved, she thinks sxs were d/t ear and B sinus congestion because she took few "Advil Cold and Sinus" and she felt better. HTN: Started medication 11/03/2019, while she was taking it she felt good, BPs were checked few times and "the bottom number was slightly high"   Review of Systems Denies chest pain, difficulty breathing.  No cough  Past Medical History:  Diagnosis Date  . Anxiety and depression   . Gait disorder   . Hyperlipidemia   . Neuropathy 08/04/2013  . Shoulder dislocation    h/o after a fall    Past Surgical History:  Procedure Laterality Date  . NO PAST SURGERIES      Allergies as of 12/19/2019      Reactions   Codeine Nausea And Vomiting   Sulfa Antibiotics    Was told allergic to sulfa does not remember why   Penicillins Rash      Medication List       Accurate as of December 19, 2019 11:59 PM. If you have any questions, ask your nurse or doctor.        STOP taking these medications   hydrochlorothiazide 12.5 MG capsule Commonly known as: Microzide Stopped by: Willow Ora, MD     TAKE these medications   ALPRAZolam 0.5 MG tablet Commonly known as: XANAX Take 0.5-1 tablets (0.25-0.5 mg total) by mouth 3 (three) times daily as needed for anxiety.   azelastine 0.1 % nasal spray Commonly known as: ASTELIN Place 2 sprays into both nostrils at bedtime as needed for rhinitis. Use in each nostril as directed   buPROPion 150 MG 12 hr tablet Commonly known as: WELLBUTRIN SR Take 2 tablets (300 mg total) by mouth daily.   calcium carbonate 600 MG Tabs tablet Commonly known as: OS-CAL Take 600 mg by mouth daily.   escitalopram 20 MG tablet Commonly known as: LEXAPRO Take 1 tablet (20 mg  total) by mouth daily.   fish oil-omega-3 fatty acids 1000 MG capsule Take 1 g by mouth daily.   glucosamine-chondroitin 500-400 MG tablet Take 1 tablet by mouth daily.   losartan 50 MG tablet Commonly known as: COZAAR Take 1 tablet (50 mg total) by mouth daily.   meclizine 12.5 MG tablet Commonly known as: ANTIVERT Take 1 tablet (12.5 mg total) by mouth 3 (three) times daily as needed for dizziness. What changed: Another medication with the same name was removed. Continue taking this medication, and follow the directions you see here. Changed by: Willow Ora, MD   multivitamin tablet Take 1 tablet by mouth daily.   pravastatin 20 MG tablet Commonly known as: PRAVACHOL Take 1 tablet (20 mg total) by mouth at bedtime.   PROBIOTIC DAILY PO Take by mouth.   Vitamin D-3 25 MCG (1000 UT) Caps Take 1 capsule by mouth daily.          Objective:   Physical Exam BP 133/72 (BP Location: Left Arm, Patient Position: Sitting, Cuff Size: Small)   Pulse (!) 54   Temp 97.7 F (36.5 C) (Oral)   Resp 18   Ht 5\' 6"  (1.676 m)   Wt 164 lb 6 oz (74.6 kg)   SpO2 97%   BMI  26.53 kg/m  General:   Well developed, NAD, BMI noted. HEENT:  Normocephalic . Face symmetric, atraumatic. TMs: Slightly bulged, not red. Lungs:  CTA B Normal respiratory effort, no intercostal retractions, no accessory muscle use. Heart: RRR,  no murmur.  Lower extremities: no pretibial edema bilaterally  Skin: Not pale. Not jaundice Neurologic:  alert & oriented X3.  Speech normal, gait appropriate for age and unassisted Psych--  Cognition and judgment appear intact.  Cooperative with normal attention span and concentration.  Behavior appropriate. No anxious or depressed appearing.      Assessment     Assessment   HTN: DX 10/2019. Hyperlipidemia.  Simvastatin intolerant.  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 BP was elevated upon arrival and recheck: 133/72, at home diastolic BP is sometimes elevated thus will recommend medication. Plan: Start a low-dose of losartan 50 mg daily, BMP in 2 weeks, monitor BPs at home Headache, dizziness: See last visit, sed rate was normal, brain MRI nonacute but they did notice a tiny infarct at the left cerebellum.  This was discussed with the patient, plan is to control CV RF regards the tiny infarct seen on the MRI. sxs essentially resolved after she took OTC decongestant, she thinks she had some sinus problems. For for now recommend observation, Flonase if she feels she has sinus sxs. Anxiety: On Xanax, check a UDS Preventive care: Flu shot today, encouraged a covid  booster. RTC labs 2 weeks RTC checkup 4 months     This visit occurred during the SARS-CoV-2 public health emergency.  Safety protocols were in place, including screening questions prior to the visit, additional usage of staff PPE, and extensive cleaning of exam room while observing appropriate contact time as indicated for disinfecting solutions.

## 2019-12-19 NOTE — Progress Notes (Signed)
Pre visit review using our clinic review tool, if applicable. No additional management support is needed unless otherwise documented below in the visit note. 

## 2019-12-20 LAB — DRUG MONITORING, PANEL 8 WITH CONFIRMATION, URINE
6 Acetylmorphine: NEGATIVE ng/mL (ref <10)
Alcohol Metabolites: NEGATIVE ng/mL
Amphetamines: NEGATIVE ng/mL (ref <500)
Benzodiazepines: NEGATIVE ng/mL (ref <100)
Buprenorphine, Urine: NEGATIVE ng/mL (ref <5)
Cocaine Metabolite: NEGATIVE ng/mL (ref <150)
Creatinine: 150.9 mg/dL
MDMA: NEGATIVE ng/mL (ref <500)
Marijuana Metabolite: NEGATIVE ng/mL (ref <20)
Opiates: NEGATIVE ng/mL (ref <100)
Oxidant: NEGATIVE ug/mL
Oxycodone: NEGATIVE ng/mL (ref <100)
pH: 6.4 (ref 4.5–9.0)

## 2019-12-20 LAB — DM TEMPLATE

## 2019-12-21 DIAGNOSIS — Z8673 Personal history of transient ischemic attack (TIA), and cerebral infarction without residual deficits: Secondary | ICD-10-CM | POA: Insufficient documentation

## 2019-12-21 NOTE — Assessment & Plan Note (Signed)
HTN: BP has been slightly elevated on and off, she was prescribed losartan and HCT last month, took it for 30 days, she ran out really, felt well. BP was elevated upon arrival and recheck: 133/72, at home diastolic BP is sometimes elevated thus will recommend medication. Plan: Start a low-dose of losartan 50 mg daily, BMP in 2 weeks, monitor BPs at home Headache, dizziness: See last visit, sed rate was normal, brain MRI nonacute but they did notice a tiny infarct at the left cerebellum.  This was discussed with the patient, plan is to control CV RF regards the tiny infarct seen on the MRI. sxs essentially resolved after she took OTC decongestant, she thinks she had some sinus problems. For for now recommend observation, Flonase if she feels she has sinus sxs. Anxiety: On Xanax, check a UDS Preventive care: Flu shot today, encouraged a covid  booster. RTC labs 2 weeks RTC checkup 4 months

## 2019-12-28 NOTE — Addendum Note (Signed)
Addended by: Mervin Kung A on: 12/28/2019 02:29 PM   Modules accepted: Orders

## 2020-01-02 ENCOUNTER — Other Ambulatory Visit (INDEPENDENT_AMBULATORY_CARE_PROVIDER_SITE_OTHER): Payer: Medicare Other

## 2020-01-02 ENCOUNTER — Other Ambulatory Visit: Payer: Self-pay

## 2020-01-02 ENCOUNTER — Other Ambulatory Visit: Payer: Self-pay | Admitting: Internal Medicine

## 2020-01-02 DIAGNOSIS — I10 Essential (primary) hypertension: Secondary | ICD-10-CM

## 2020-01-02 LAB — BASIC METABOLIC PANEL
BUN: 17 mg/dL (ref 6–23)
CO2: 28 mEq/L (ref 19–32)
Calcium: 8.9 mg/dL (ref 8.4–10.5)
Chloride: 109 mEq/L (ref 96–112)
Creatinine, Ser: 0.91 mg/dL (ref 0.40–1.20)
GFR: 58.62 mL/min — ABNORMAL LOW (ref >60.00)
Glucose, Bld: 89 mg/dL (ref 70–99)
Potassium: 3.7 mEq/L (ref 3.5–5.1)
Sodium: 143 mEq/L (ref 135–145)

## 2020-01-02 MED ORDER — LOSARTAN POTASSIUM 50 MG PO TABS: 50.0000 mg | ORAL_TABLET | Freq: Every day | ORAL | 4 refills | Status: DC

## 2020-01-02 NOTE — Addendum Note (Signed)
Addended byConrad [place] D on: 01/02/2020 03:25 PM   Modules accepted: Orders

## 2020-01-11 MED FILL — LOSARTAN POTASSIUM 50 MG TA: 50 | 30 days supply | Qty: 30 | Fill #0

## 2020-03-19 ENCOUNTER — Telehealth: Payer: Medicare Other | Admitting: Internal Medicine

## 2020-03-19 ENCOUNTER — Other Ambulatory Visit: Payer: Self-pay

## 2020-03-19 MED FILL — LOSARTAN POTASSIUM 50 MG TA: 50 | 30 days supply | Qty: 30 | Fill #1

## 2020-04-22 ENCOUNTER — Ambulatory Visit: Payer: Medicare Other | Admitting: Internal Medicine

## 2020-04-26 ENCOUNTER — Telehealth: Payer: Self-pay | Admitting: Internal Medicine

## 2020-04-26 MED FILL — LOSARTAN POTASSIUM 50 MG TA: 50 | 30 days supply | Qty: 30 | Fill #2

## 2020-04-26 NOTE — Telephone Encounter (Signed)
[facility] appt r/s 08/22/20 w/ Thomasenia Sales

## 2020-05-03 ENCOUNTER — Encounter: Payer: Self-pay | Admitting: Internal Medicine

## 2020-05-03 ENCOUNTER — Other Ambulatory Visit: Payer: Self-pay

## 2020-05-03 ENCOUNTER — Ambulatory Visit (INDEPENDENT_AMBULATORY_CARE_PROVIDER_SITE_OTHER): Payer: Medicare Other | Admitting: Internal Medicine

## 2020-05-03 ENCOUNTER — Other Ambulatory Visit: Payer: Self-pay | Admitting: Internal Medicine

## 2020-05-03 VITALS — BP 144/70 | HR 60 | Temp 98.2°F | Resp 16 | Ht 66.0 in | Wt 161.0 lb

## 2020-05-03 DIAGNOSIS — E785 Hyperlipidemia, unspecified: Secondary | ICD-10-CM

## 2020-05-03 DIAGNOSIS — F32A Depression, unspecified: Secondary | ICD-10-CM | POA: Diagnosis not present

## 2020-05-03 DIAGNOSIS — I10 Essential (primary) hypertension: Secondary | ICD-10-CM | POA: Diagnosis not present

## 2020-05-03 DIAGNOSIS — R7989 Other specified abnormal findings of blood chemistry: Secondary | ICD-10-CM

## 2020-05-03 DIAGNOSIS — F419 Anxiety disorder, unspecified: Secondary | ICD-10-CM

## 2020-05-03 DIAGNOSIS — Z79899 Other long term (current) drug therapy: Secondary | ICD-10-CM

## 2020-05-03 MED ORDER — ALPRAZOLAM 0.5 MG PO TABS: 0.2500 mg | ORAL_TABLET | Freq: Three times a day (TID) | ORAL | 0 refills | Status: DC | PRN

## 2020-05-03 MED ORDER — LOSARTAN POTASSIUM 50 MG PO TABS: 50.0000 mg | ORAL_TABLET | Freq: Every day | ORAL | 1 refills | Status: DC

## 2020-05-03 MED FILL — ALPRAZolam 0.5 MG TABS: 0.5 | 10 days supply | Qty: 30 | Fill #0

## 2020-05-03 NOTE — Progress Notes (Signed)
Subjective:    Patient ID: Andrea Martin, female    DOB: May 28, 1935, 85 y.o.   MRN: [id number]  DOS:  05/03/2020 Type of visit - description: f/u Since the last office visit she is doing well. Anxiety depression: Controlled, takes Xanax sporadically. Caregiver fatigue: She takes care of her husband who has dementia,  is very physically and psychologically demanding.  She feels very tired mostly at the end of the day but denies feeling sleepy.  Headaches: No further problems HTN: Good med compliance, no recent ambulatory BPs.  Review of Systems Denies any chest pain, difficulty breathing. No suicidal ideas  Past Medical History:  Diagnosis Date   Anxiety and depression    Gait disorder    Hyperlipidemia    Neuropathy 08/04/2013   Shoulder dislocation    h/o after a fall    Past Surgical History:  Procedure Laterality Date   NO PAST SURGERIES      Allergies as of 05/03/2020      Reactions   Codeine Nausea And Vomiting   Sulfa Antibiotics    Was told allergic to sulfa does not remember why   Penicillins Rash      Medication List       Accurate as of May 03, 2020 11:59 PM. If you have any questions, ask your nurse or doctor.        ALPRAZolam 0.5 MG tablet Commonly known as: XANAX Take 0.5-1 tablets (0.25-0.5 mg total) by mouth 3 (three) times daily as needed for anxiety.   azelastine 0.1 % nasal spray Commonly known as: ASTELIN Place 2 sprays into both nostrils at bedtime as needed for rhinitis. Use in each nostril as directed   buPROPion 150 MG 12 hr tablet Commonly known as: WELLBUTRIN SR Take 2 tablets (300 mg total) by mouth daily.   calcium carbonate 600 MG Tabs tablet Commonly known as: OS-CAL Take 600 mg by mouth daily.   escitalopram 20 MG tablet Commonly known as: LEXAPRO Take 1 tablet (20 mg total) by mouth daily.   fish oil-omega-3 fatty acids 1000 MG capsule Take 1 g by mouth daily.   glucosamine-chondroitin 500-400 MG tablet Take  1 tablet by mouth daily.   losartan 50 MG tablet Commonly known as: COZAAR Take 1 tablet (50 mg total) by mouth daily.   meclizine 12.5 MG tablet Commonly known as: ANTIVERT Take 1 tablet (12.5 mg total) by mouth 3 (three) times daily as needed for dizziness.   multivitamin tablet Take 1 tablet by mouth daily.   pravastatin 20 MG tablet Commonly known as: PRAVACHOL Take 1 tablet (20 mg total) by mouth at bedtime.   PROBIOTIC DAILY PO Take by mouth.   Vitamin D-3 25 MCG (1000 UT) Caps Take 1 capsule by mouth daily.          Objective:   Physical Exam BP (!) 144/70 (BP Location: Left Arm, Patient Position: Sitting, Cuff Size: Small)    Pulse 60    Temp 98.2 F (36.8 C) (Oral)    Resp 16    Ht 5\' 6"  (1.676 m)    Wt 161 lb (73 kg)    SpO2 97%    BMI 25.99 kg/m  General:   Well developed, NAD, BMI noted.  HEENT:  Normocephalic . Face symmetric, atraumatic Lungs:  CTA B Normal respiratory effort, no intercostal retractions, no accessory muscle use. Heart: RRR,  no murmur.  Abdomen:  Not distended, soft, non-tender. No rebound or rigidity.   Skin: Not  pale. Not jaundice Lower extremities: no pretibial edema bilaterally  Neurologic:  alert & oriented X3.  Speech normal, gait appropriate for age and unassisted Psych--  Cognition and judgment appear intact.  Cooperative with normal attention span and concentration.  Behavior appropriate. No anxious or depressed appearing.     Assessment       Assessment   HTN: DX 10/2019. Hyperlipidemia.  Simvastatin intolerant.  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 Hyperlipidemia: On Pravachol, check FLP. TSH at upper normal limits:  Recheck TSH Anxiety, depression: The patient is doing very well, is really fatigued physically and mentally from helping her husband who has dementia but she is not depressed.  I applauded the way she takes care of her husband. I did advise her to take some time for herself to prevent caregiver fatigue. Continue Lexapro, Wellbutrin, she takes Xanax sporadically without oversedation.  PDMP okay, refill sent. Headache, dizziness: No further problems. Preventive care: MMG: 10-2018 RTC fasting labs next week RTC CPX 4 months   This visit occurred during the SARS-CoV-2 public health emergency.  Safety protocols were in place, including screening questions prior to the visit, additional usage of staff PPE, and extensive cleaning of exam room while observing appropriate contact time as indicated for disinfecting solutions.

## 2020-05-03 NOTE — Patient Instructions (Addendum)
Check the  blood pressure once a week BP GOAL is between 110/65 and  135/85. If it is consistently higher or lower, let me know    GO TO THE FRONT DESK, PLEASE SCHEDULE YOUR APPOINTMENTS Come back for blood work next week fasting  Come back for a physical  exam in 4 months

## 2020-05-05 ENCOUNTER — Other Ambulatory Visit: Payer: Self-pay | Admitting: Internal Medicine

## 2020-05-05 NOTE — Assessment & Plan Note (Signed)
HTN: Continue losartan, check CMP. Hyperlipidemia: On Pravachol, check FLP. TSH at upper normal limits: Recheck TSH Anxiety, depression: The patient is doing very well, is really fatigued physically and mentally from helping her husband who has dementia but she is not depressed.  I applauded the way she takes care of her husband. I did advise her to take some time for herself to prevent caregiver fatigue. Continue Lexapro, Wellbutrin, she takes Xanax sporadically without oversedation.  PDMP okay, refill sent. Headache, dizziness: No further problems. Preventive care: MMG: 10-2018 RTC fasting labs next week RTC CPX 4 months

## 2020-05-06 NOTE — Addendum Note (Signed)
Addended byConrad [place] D on: 05/06/2020 08:01 AM   Modules accepted: Orders

## 2020-05-13 ENCOUNTER — Other Ambulatory Visit: Payer: Medicare Other

## 2020-06-11 ENCOUNTER — Other Ambulatory Visit: Payer: Medicare Other

## 2020-06-18 ENCOUNTER — Other Ambulatory Visit: Payer: Medicare Other

## 2020-06-20 ENCOUNTER — Other Ambulatory Visit (INDEPENDENT_AMBULATORY_CARE_PROVIDER_SITE_OTHER): Payer: Medicare Other

## 2020-06-20 ENCOUNTER — Other Ambulatory Visit: Payer: Self-pay

## 2020-06-20 DIAGNOSIS — Z79899 Other long term (current) drug therapy: Secondary | ICD-10-CM | POA: Diagnosis not present

## 2020-06-20 DIAGNOSIS — I10 Essential (primary) hypertension: Secondary | ICD-10-CM

## 2020-06-20 DIAGNOSIS — R7989 Other specified abnormal findings of blood chemistry: Secondary | ICD-10-CM

## 2020-06-20 DIAGNOSIS — F419 Anxiety disorder, unspecified: Secondary | ICD-10-CM

## 2020-06-20 DIAGNOSIS — F32A Depression, unspecified: Secondary | ICD-10-CM

## 2020-06-20 DIAGNOSIS — E785 Hyperlipidemia, unspecified: Secondary | ICD-10-CM

## 2020-06-20 LAB — LIPID PANEL
Cholesterol: 213 mg/dL — ABNORMAL HIGH (ref 0–200)
HDL: 47.7 mg/dL (ref >39.00)
LDL Cholesterol: 132 mg/dL — ABNORMAL HIGH (ref 0–99)
NonHDL: 164.85
Total CHOL/HDL Ratio: 4
Triglycerides: 165 mg/dL — ABNORMAL HIGH (ref 0.0–149.0)
VLDL: 33 mg/dL (ref 0.0–40.0)

## 2020-06-20 LAB — COMPREHENSIVE METABOLIC PANEL
ALT: 14 U/L (ref 0–35)
AST: 18 U/L (ref 0–37)
Albumin: 3.9 g/dL (ref 3.5–5.2)
Alkaline Phosphatase: 58 U/L (ref 39–117)
BUN: 19 mg/dL (ref 6–23)
CO2: 31 mEq/L (ref 19–32)
Calcium: 9.3 mg/dL (ref 8.4–10.5)
Chloride: 107 mEq/L (ref 96–112)
Creatinine, Ser: 0.86 mg/dL (ref 0.40–1.20)
GFR: 62.53 mL/min (ref >60.00)
Glucose, Bld: 79 mg/dL (ref 70–99)
Potassium: 4.4 mEq/L (ref 3.5–5.1)
Sodium: 143 mEq/L (ref 135–145)
Total Bilirubin: 0.5 mg/dL (ref 0.2–1.2)
Total Protein: 6.2 g/dL (ref 6.0–8.3)

## 2020-06-20 LAB — TSH: TSH: 5.04 u[IU]/mL — ABNORMAL HIGH (ref 0.35–4.50)

## 2020-06-22 LAB — DRUG MONITORING, PANEL 8 WITH CONFIRMATION, URINE
6 Acetylmorphine: NEGATIVE ng/mL (ref <10)
Alcohol Metabolites: NEGATIVE ng/mL
Alphahydroxyalprazolam: 38 ng/mL — ABNORMAL HIGH (ref <25)
Alphahydroxymidazolam: NEGATIVE ng/mL (ref <50)
Alphahydroxytriazolam: NEGATIVE ng/mL (ref <50)
Aminoclonazepam: NEGATIVE ng/mL (ref <25)
Amphetamines: NEGATIVE ng/mL (ref <500)
Benzodiazepines: POSITIVE ng/mL — AB (ref <100)
Buprenorphine, Urine: NEGATIVE ng/mL (ref <5)
Cocaine Metabolite: NEGATIVE ng/mL (ref <150)
Creatinine: 105.3 mg/dL
Hydroxyethylflurazepam: NEGATIVE ng/mL (ref <50)
Lorazepam: NEGATIVE ng/mL (ref <50)
MDMA: NEGATIVE ng/mL (ref <500)
Marijuana Metabolite: NEGATIVE ng/mL (ref <20)
Nordiazepam: NEGATIVE ng/mL (ref <50)
Opiates: NEGATIVE ng/mL (ref <100)
Oxazepam: NEGATIVE ng/mL (ref <50)
Oxidant: NEGATIVE ug/mL
Oxycodone: NEGATIVE ng/mL (ref <100)
Temazepam: NEGATIVE ng/mL (ref <50)
pH: 7 (ref 4.5–9.0)

## 2020-06-22 LAB — DM TEMPLATE

## 2020-07-12 ENCOUNTER — Ambulatory Visit: Payer: Medicare Other | Admitting: Family

## 2020-07-24 ENCOUNTER — Other Ambulatory Visit: Payer: Self-pay

## 2020-07-24 ENCOUNTER — Other Ambulatory Visit: Payer: Self-pay | Admitting: Internal Medicine

## 2020-07-24 ENCOUNTER — Ambulatory Visit (INDEPENDENT_AMBULATORY_CARE_PROVIDER_SITE_OTHER): Payer: Medicare Other | Admitting: Internal Medicine

## 2020-07-24 ENCOUNTER — Other Ambulatory Visit (HOSPITAL_BASED_OUTPATIENT_CLINIC_OR_DEPARTMENT_OTHER): Payer: Self-pay

## 2020-07-24 ENCOUNTER — Encounter: Payer: Self-pay | Admitting: Internal Medicine

## 2020-07-24 VITALS — BP 138/74 | HR 54 | Temp 98.1°F | Resp 16 | Ht 66.0 in | Wt 156.5 lb

## 2020-07-24 DIAGNOSIS — F419 Anxiety disorder, unspecified: Secondary | ICD-10-CM

## 2020-07-24 DIAGNOSIS — R5383 Other fatigue: Secondary | ICD-10-CM | POA: Diagnosis not present

## 2020-07-24 DIAGNOSIS — F32A Depression, unspecified: Secondary | ICD-10-CM | POA: Diagnosis not present

## 2020-07-24 NOTE — Progress Notes (Signed)
Subjective:    Patient ID: Andrea Martin, female    DOB: 04-05-1935, 85 y.o.   MRN: [id number]  DOS:  07/24/2020 Type of visit - description: Acute  The patient came here complaining of right ear fullness for few days without pain or discharge.  She thinks she has wax.  However, the real reason for the visit  is caregiver fatigue. She takes care of her husband who is my patient, he has advanced dementia and dependent 100% on Kami These make her both tired and sad, she knows that her husband is mentally absent.  She denies fever chills No headache, chest pain. No difficulty breathing or cough No weight loss.  Review of Systems See above   Past Medical History:  Diagnosis Date  . Anxiety and depression   . Gait disorder   . Hyperlipidemia   . Neuropathy 08/04/2013  . Shoulder dislocation    h/o after a fall    Past Surgical History:  Procedure Laterality Date  . NO PAST SURGERIES      Allergies as of 07/24/2020      Reactions   Codeine Nausea And Vomiting   Sulfa Antibiotics    Was told allergic to sulfa does not remember why   Penicillins Rash      Medication List       Accurate as of July 24, 2020 11:59 PM. If you have any questions, ask your nurse or doctor.        ALPRAZolam 0.5 MG tablet Commonly known as: XANAX TAKE 1/2 - 1 TABLET BY MOUTH 3 TIMES DAILY AS NEEDED FOR ANXIETY   azelastine 0.1 % nasal spray Commonly known as: ASTELIN Place 2 sprays into both nostrils at bedtime as needed for rhinitis. Use in each nostril as directed   buPROPion 150 MG 12 hr tablet Commonly known as: WELLBUTRIN SR Take 2 tablets (300 mg total) by mouth daily.   calcium carbonate 600 MG Tabs tablet Commonly known as: OS-CAL Take 600 mg by mouth daily.   escitalopram 20 MG tablet Commonly known as: LEXAPRO Take 1 tablet (20 mg total) by mouth daily.   fish oil-omega-3 fatty acids 1000 MG capsule Take 1 g by mouth daily.   glucosamine-chondroitin 500-400 MG  tablet Take 1 tablet by mouth daily.   losartan 50 MG tablet Commonly known as: COZAAR Take 1 tablet (50 mg total) by mouth daily.   meclizine 12.5 MG tablet Commonly known as: ANTIVERT Take 1 tablet (12.5 mg total) by mouth 3 (three) times daily as needed for dizziness.   multivitamin tablet Take 1 tablet by mouth daily.   pravastatin 20 MG tablet Commonly known as: PRAVACHOL Take 1 tablet (20 mg total) by mouth at bedtime.   PROBIOTIC DAILY PO Take by mouth.   Vitamin D-3 25 MCG (1000 UT) Caps Take 1 capsule by mouth daily.          Objective:   Physical Exam BP 138/74 (BP Location: Left Arm, Patient Position: Sitting, Cuff Size: Small)   Pulse (!) 54   Temp 98.1 F (36.7 C) (Oral)   Resp 16   Ht 5\' 6"  (1.676 m)   Wt 156 lb 8 oz (71 kg)   SpO2 97%   BMI 25.26 kg/m  General:   Well developed, NAD, BMI noted. HEENT:  Normocephalic . Face symmetric, atraumatic Lungs:  CTA B Normal respiratory effort, no intercostal retractions, no accessory muscle use. Heart: RRR,  no murmur.  Lower extremities: no pretibial  edema bilaterally  Skin: Not pale. Not jaundice Neurologic:  alert & oriented X3.  Speech normal, gait appropriate for age and unassisted Psych--  Cognition and judgment appear intact.  Cooperative with normal attention span and concentration.  Behavior appropriate but tearful.     Assessment     Assessment   HTN: DX 10/2019. Hyperlipidemia.  Simvastatin intolerant.  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  He is very sweet and compliant but his care is physically and  emotionally quite demanding  for [place]. Extensive listening therapy provided. Strategies to cope provided including saving some time for herself, continue recruiting friends and family to take care of the husband for few hours while she rests, etc. She is already taking Lexapro and Wellbutrin. No suicidal ideas She is afraid of taking Xanax and currently takes half tablet  sporadically, she notices that it helps significantly and does not cause excessive sedation. Advised is okay to take a little more, perhaps twice daily. Ideally, she could seek some placement or "day care" however she reports she does not have the resources After long conversation she felt better. Anxiety, depression: As described above. Cerumen: Minimal amount of cerumen on both ears, peroxide as needed Follow-up set up for July.     This visit occurred during the SARS-CoV-2 public health emergency.  Safety protocols were in place, including screening questions prior to the visit, additional usage of staff PPE, and extensive cleaning of exam room while observing appropriate contact time as indicated for disinfecting solutions.

## 2020-07-25 ENCOUNTER — Other Ambulatory Visit (HOSPITAL_BASED_OUTPATIENT_CLINIC_OR_DEPARTMENT_OTHER): Payer: Self-pay

## 2020-07-25 NOTE — Assessment & Plan Note (Signed)
Caregiver fatigue: The patient is this caregiver for her husband who has advanced dementia.  He is very sweet and compliant but his care is physically and  emotionally quite demanding for [place]. Extensive listening therapy provided. Strategies to cope provided including saving some time for herself, continue recruiting friends and family to take care of the husband for few hours while she rests, etc. She is already taking Lexapro and Wellbutrin. No suicidal ideas She is afraid of taking Xanax and currently takes half tablet  sporadically, she notices that it helps significantly and does not cause excessive sedation. Advised is okay to take a little more, perhaps twice daily. Ideally, she could seek some placement or "day care" however she reports she does not have the resources After long conversation she felt better. Anxiety, depression: As described above. Cerumen: Minimal amount of cerumen on both ears, peroxide as needed Follow-up set up for July.

## 2020-07-30 ENCOUNTER — Ambulatory Visit: Payer: Medicare Other | Attending: Internal Medicine

## 2020-07-30 ENCOUNTER — Other Ambulatory Visit (HOSPITAL_BASED_OUTPATIENT_CLINIC_OR_DEPARTMENT_OTHER): Payer: Self-pay

## 2020-07-30 DIAGNOSIS — Z23 Encounter for immunization: Secondary | ICD-10-CM

## 2020-07-30 MED ORDER — PFIZER-BIONT COVID-19 VAC-TRIS 30 MCG/0.3ML IM SUSP
INTRAMUSCULAR | 0 refills | Status: DC
  Filled 2020-07-30: qty 0.3, 1d supply, fill #0

## 2020-07-30 NOTE — Progress Notes (Signed)
   Covid-[facility] Vaccination [facility]  Name:  Andrea Martin    MRN: [id number] DOB: December 20, 1935  07/30/2020  [name] was observed post Covid-19 immunization for 15 minutes without incident. She was provided with Vaccine Information Sheet and instruction to access the V-Safe system.   [name] was instructed to call 911 with any severe reactions post vaccine: Marland Kitchen Difficulty breathing  . Swelling of face and throat  . A fast heartbeat  . A bad rash all over body  . Dizziness and weakness   Immunizations Administered    Name Date Dose VIS Date Route   PFIZER Comrnaty(Gray TOP) Covid-19 Vaccine 07/30/2020 12:13 PM 0.3 mL 02/01/2020 Intramuscular   Manufacturer: [employer], [employer]   Lot: [id number]   NDC: [phone]

## 2020-08-05 ENCOUNTER — Telehealth: Payer: Self-pay | Admitting: Internal Medicine

## 2020-08-05 ENCOUNTER — Other Ambulatory Visit (HOSPITAL_BASED_OUTPATIENT_CLINIC_OR_DEPARTMENT_OTHER): Payer: Self-pay

## 2020-08-05 MED ORDER — LOSARTAN POTASSIUM 50 MG PO TABS
50.0000 mg | ORAL_TABLET | Freq: Every day | ORAL | 0 refills | Status: DC
  Filled 2020-08-05: qty 30, 30d supply, fill #0

## 2020-08-05 NOTE — Telephone Encounter (Signed)
Requesting: alprazolam 0.5mg  Contract: 07/19/2019 UDS: 06/20/2020 Last Visit: 07/24/2020 Next Visit: 09/02/2020 Last Refill: 05/03/2020 #30 and 0RF  Please Advise

## 2020-08-06 ENCOUNTER — Other Ambulatory Visit (HOSPITAL_BASED_OUTPATIENT_CLINIC_OR_DEPARTMENT_OTHER): Payer: Self-pay

## 2020-08-06 MED ORDER — ALPRAZOLAM 0.5 MG PO TABS
ORAL_TABLET | ORAL | 1 refills | Status: DC
  Filled 2020-08-06: qty 90, 30d supply, fill #0
  Filled 2020-12-05: qty 90, 30d supply, fill #1

## 2020-08-06 NOTE — Telephone Encounter (Signed)
PDMP okay, Rx sent 

## 2020-08-19 ENCOUNTER — Ambulatory Visit: Payer: Medicare Other | Admitting: *Deleted

## 2020-08-21 NOTE — Progress Notes (Deleted)
Subjective:   Andrea Martin is a 85 y.o. female who presents for Medicare Annual (Subsequent) preventive examination.  Review of Systems    ***       Objective:    There were no vitals filed for this visit. There is no height or weight on file to calculate BMI.  Advanced Directives 08/28/2019 08/17/2019 08/15/2018  Does Patient Have a Medical Advance Directive? Yes Yes Yes  Type of Estate agent of [place];Living will Healthcare Power of [place];Living will Healthcare Power of [place];Living will  Does patient want to make changes to medical advance directive? No - Patient declined No - Patient declined No - Patient declined  Copy of Healthcare Power of Attorney in Chart? No - copy requested No - copy requested No - copy requested    Current Medications (verified) Outpatient Encounter Medications as of 08/22/2020  Medication Sig   ALPRAZolam (XANAX) 0.5 MG tablet TAKE 1/2 - 1 TABLET BY MOUTH 3 TIMES DAILY AS NEEDED FOR ANXIETY   azelastine (ASTELIN) 0.1 % nasal spray Place 2 sprays into both nostrils at bedtime as needed for rhinitis. Use in each nostril as directed   buPROPion (WELLBUTRIN SR) 150 MG 12 hr tablet Take 2 tablets (300 mg total) by mouth daily.   calcium carbonate (OS-CAL) 600 MG TABS Take 600 mg by mouth daily.   Cholecalciferol (VITAMIN D-3) 1000 UNITS CAPS Take 1 capsule by mouth daily.   COVID-19 mRNA Vac-TriS, Pfizer, (PFIZER-BIONT COVID-19 VAC-TRIS) SUSP injection Inject into the muscle.   escitalopram (LEXAPRO) 20 MG tablet Take 1 tablet (20 mg total) by mouth daily.   fish oil-omega-3 fatty acids 1000 MG capsule Take 1 g by mouth daily.   glucosamine-chondroitin 500-400 MG tablet Take 1 tablet by mouth daily.   losartan (COZAAR) 50 MG tablet Take 1 tablet (50 mg total) by mouth daily.   meclizine (ANTIVERT) 12.5 MG tablet Take 1 tablet (12.5 mg total) by mouth 3 (three) times daily as needed for dizziness.   Multiple Vitamin  (MULTIVITAMIN) tablet Take 1 tablet by mouth daily.   pravastatin (PRAVACHOL) 20 MG tablet Take 1 tablet (20 mg total) by mouth at bedtime.   Probiotic Product (PROBIOTIC DAILY PO) Take by mouth.   No facility-administered encounter medications on file as of 08/22/2020.    Allergies (verified) Codeine, Sulfa antibiotics, and Penicillins   History: Past Medical History:  Diagnosis Date   Anxiety and depression    Gait disorder    Hyperlipidemia    Neuropathy 08/04/2013   Shoulder dislocation    h/o after a fall   Past Surgical History:  Procedure Laterality Date   NO PAST SURGERIES     Family History  Problem Relation Age of Onset   Heart disease Father        CAD?   Coronary artery disease Brother 50   Heart disease Mother    Breast cancer Neg Hx    Colon cancer Neg Hx    Diabetes Neg Hx    Stroke Neg Hx    Social History   Socioeconomic History   Marital status: Married    Spouse name: Not on file   Number of children: 1   Years of education: Not on file   Highest education level: Not on file  Occupational History   Occupation: retired  Tobacco Use   Smoking status: Never   Smokeless tobacco: Never  Vaping Use   Vaping Use: Never used  Substance and Sexual Activity  Alcohol use: No   Drug use: No   Sexual activity: Not on file  Other Topics Concern   Not on file  Social History Narrative   Lives w/ husband , share the house w/ daughter and one of her 2 children    Social Determinants of Health   Financial Resource Strain: Low Risk    Difficulty of Paying Living Expenses: Not hard at all  Food Insecurity: No Food Insecurity   Worried About Programme researcher, broadcasting/film/video in the Last Year: Never true   Barista in the Last Year: Never true  Transportation Needs: No Transportation Needs   Lack of Transportation (Medical): No   Lack of Transportation (Non-Medical): No  Physical Activity: Not on file  Stress: Not on file  Social Connections: Not on file     Tobacco Counseling Counseling given: Not Answered   Clinical Intake:                 Diabetic?No         Activities of Daily Living No flowsheet data found.  Patient Care Team: Wanda Plump, MD as PCP - General (Internal Medicine) Ranee Gosselin, MD as Consulting Physician (Orthopedic Surgery) Vida Rigger, MD as Consulting Physician (Gastroenterology)  Indicate any recent Medical Services you may have received from other than [facility] providers in the past year (date may be approximate).     Assessment:   This is a routine wellness examination for [place].  Hearing/Vision screen No results found.  Dietary issues and exercise activities discussed:     Goals Addressed   None    Depression Screen PHQ 2/9 Scores 07/24/2020 05/03/2020 11/07/2019 08/17/2019 07/19/2019 05/09/2019 11/09/2018  PHQ - 2 Score 4 1 1 1 3  0 2  PHQ- 9 Score 15 3 1  - 19 0 3    Fall Risk Fall Risk  05/03/2020 08/17/2019 05/09/2019 08/15/2018 11/03/2017  Falls in the past year? 0 0 1 0 Yes  Number falls in past yr: 0 0 0 - 2 or more  Injury with Fall? 0 0 0 - No  Risk for fall due to : - - - - Medication side effect;Impaired balance/gait  Follow up - Education provided;Falls prevention discussed Falls evaluation completed - -    FALL RISK PREVENTION PERTAINING TO THE HOME:  Any stairs in or around the home? {YES/NO:21197} If so, are there any without handrails? {YES/NO:21197} Home free of loose throw rugs in walkways, pet beds, electrical cords, etc? {YES/NO:21197} Adequate lighting in your home to reduce risk of falls? {YES/NO:21197}  ASSISTIVE DEVICES UTILIZED TO PREVENT FALLS:  Life alert? {YES/NO:21197} Use of a cane, walker or w/c? {YES/NO:21197} Grab bars in the bathroom? {YES/NO:21197} Shower chair or bench in shower? {YES/NO:21197} Elevated toilet seat or a handicapped toilet? {YES/NO:21197}  TIMED UP AND GO:  Was the test performed? {YES/NO:21197}.  Length of time to  ambulate 10 feet: *** sec.   {Appearance of 08/17/2018  Cognitive Function:        Immunizations Immunization History  Administered Date(s) Administered   Fluad Quad(high Dose 65+) 12/19/2019   Influenza Split 12/25/2011, 11/19/2018   Influenza, High Dose Seasonal PF 12/09/2012, 11/29/2015   Influenza,inj,Quad PF,6+ Mos 12/08/2013   Influenza-Unspecified 12/24/2014, 12/10/2016, 12/29/2017   PFIZER Comirnaty(Gray Top)Covid-19 Tri-Sucrose Vaccine 07/30/2020   PFIZER(Purple Top)SARS-COV-2 Vaccination 03/16/2019, 04/06/2019, 02/22/2020   Pneumococcal Conjugate-13 08/04/2013   Pneumococcal Polysaccharide-23 01/23/2010, 05/04/2018   Td 04/27/2011   Zoster Recombinat (Shingrix) 11/03/2018, 01/13/2019   Zoster, Live  04/23/2009    TDAP status: Up to date  Flu Vaccine status: Up to date  Pneumococcal vaccine status: Up to date  Covid-19 vaccine status: Completed vaccines  Qualifies for Shingles Vaccine? No   Zostavax completed Yes   Shingrix Completed?: Yes  Screening Tests Health Maintenance  Topic Date Due   INFLUENZA VACCINE  09/23/2020   COVID-19 Vaccine (5 - Booster for Pfizer series) 11/29/2020   TETANUS/TDAP  04/26/2021   DEXA SCAN  Completed   PNA vac Low Risk Adult  Completed   Zoster Vaccines- Shingrix  Completed   HPV VACCINES  Aged Out    Health Maintenance  There are no preventive care reminders to display for this patient.  Colorectal cancer screening: No longer required.   {Mammogram status:21018020}  Bone Density status: Completed 11/16/2018. Results reflect: Bone density results: OSTEOPENIA. Repeat every 2 years.  Lung Cancer Screening: (Low Dose CT Chest recommended if Age 78-80 years, 30 pack-year currently smoking OR have quit w/in 15years.) does not qualify.     Additional Screening:  Hepatitis C Screening: does not qualify  Vision Screening: Recommended annual ophthalmology exams for early detection of glaucoma and other disorders of  the eye. Is the patient up to date with their annual eye exam?  {YES/NO:21197} Who is the provider or what is the name of the office in which the patient attends annual eye exams? *** If pt is not established with a provider, would they like to be referred to a provider to establish care? {YES/NO:21197}.   Dental Screening: Recommended annual dental exams for proper oral hygiene  Community Resource Referral / Chronic Care Management: CRR required this visit?  {YES/NO:21197}  CCM required this visit?  {YES/NO:21197}     Plan:     I have personally reviewed and noted the following in the patient's chart:   Medical and social history Use of alcohol, tobacco or illicit drugs  Current medications and supplements including opioid prescriptions.  Functional ability and status Nutritional status Physical activity Advanced directives List of other physicians Hospitalizations, surgeries, and ER visits in previous 12 months Vitals Screenings to include cognitive, depression, and falls Referrals and appointments  In addition, I have reviewed and discussed with patient certain preventive protocols, quality metrics, and best practice recommendations. A written personalized care plan for preventive services as well as general preventive health recommendations were provided to patient.     Roanna Raider, LPN   7/41/2878  Nurse Health Advisor  Nurse Notes: ***

## 2020-08-22 ENCOUNTER — Ambulatory Visit: Payer: Medicare Other

## 2020-08-27 ENCOUNTER — Other Ambulatory Visit (HOSPITAL_BASED_OUTPATIENT_CLINIC_OR_DEPARTMENT_OTHER): Payer: Self-pay

## 2020-08-27 ENCOUNTER — Other Ambulatory Visit: Payer: Self-pay | Admitting: Internal Medicine

## 2020-08-27 MED ORDER — PRAVASTATIN SODIUM 20 MG PO TABS
20.0000 mg | ORAL_TABLET | Freq: Every day | ORAL | 1 refills | Status: DC
  Filled 2020-08-27: qty 90, 90d supply, fill #0
  Filled 2021-03-12: qty 90, 90d supply, fill #1

## 2020-08-27 MED ORDER — LOSARTAN POTASSIUM 50 MG PO TABS
50.0000 mg | ORAL_TABLET | Freq: Every day | ORAL | 1 refills | Status: DC
  Filled 2020-08-27 – 2020-08-30 (×2): qty 90, 90d supply, fill #0
  Filled 2020-12-30: qty 90, 90d supply, fill #1

## 2020-08-28 ENCOUNTER — Ambulatory Visit (INDEPENDENT_AMBULATORY_CARE_PROVIDER_SITE_OTHER): Payer: Medicare Other

## 2020-08-28 VITALS — Ht 66.0 in | Wt 156.0 lb

## 2020-08-28 DIAGNOSIS — Z0000 Encounter for general adult medical examination without abnormal findings: Secondary | ICD-10-CM | POA: Diagnosis not present

## 2020-08-28 NOTE — Patient Instructions (Signed)
Andrea Martin , Thank you for taking time to complete your Medicare Wellness Visit. I appreciate your ongoing commitment to your health goals. Please review the following plan we discussed and let me know if I can assist you in the future.   Screening recommendations/referrals: Colonoscopy: No longer required Mammogram: Due-Per our conversation, you will call & schedule. Bone Density: Completed 11/16/2018-Due 11/15/2020 Recommended yearly ophthalmology/optometry visit for glaucoma screening and checkup Recommended yearly dental visit for hygiene and checkup  Vaccinations: Influenza vaccine: Up to date Pneumococcal vaccine: Up to date Tdap vaccine: Up to date-Due 04/26/2021 Shingles vaccine: Completed vaccines   Covid-19:Up to date  Advanced directives: Please bring a copy for your chart once available.  Conditions/risks identified: See problem list  Next appointment: Follow up in one year for your annual wellness visit    Preventive Care 65 Years and Older, Female Preventive care refers to lifestyle choices and visits with your health care provider that can promote health and wellness. What does preventive care include? A yearly physical exam. This is also called an annual well check. Dental exams once or twice a year. Routine eye exams. Ask your health care provider how often you should have your eyes checked. Personal lifestyle choices, including: Daily care of your teeth and gums. Regular physical activity. Eating a healthy diet. Avoiding tobacco and drug use. Limiting alcohol use. Practicing safe sex. Taking low-dose aspirin every day. Taking vitamin and mineral supplements as recommended by your health care provider. What happens during an annual well check? The services and screenings done by your health care provider during your annual well check will depend on your age, overall health, lifestyle risk factors, and family history of disease. Counseling  Your health care  provider may ask you questions about your: Alcohol use. Tobacco use. Drug use. Emotional well-being. Home and relationship well-being. Sexual activity. Eating habits. History of falls. Memory and ability to understand (cognition). Work and work Astronomer. Reproductive health. Screening  You may have the following tests or measurements: Height, weight, and BMI. Blood pressure. Lipid and cholesterol levels. These may be checked every 5 years, or more frequently if you are over 41 years old. Skin check. Lung cancer screening. You may have this screening every year starting at age 50 if you have a 30-pack-year history of smoking and currently smoke or have quit within the past 15 years. Fecal occult blood test (FOBT) of the stool. You may have this test every year starting at age 87. Flexible sigmoidoscopy or colonoscopy. You may have a sigmoidoscopy every 5 years or a colonoscopy every 10 years starting at age 74. Hepatitis C blood test. Hepatitis B blood test. Sexually transmitted disease (STD) testing. Diabetes screening. This is done by checking your blood sugar (glucose) after you have not eaten for a while (fasting). You may have this done every 1-3 years. Bone density scan. This is done to screen for osteoporosis. You may have this done starting at age 76. Mammogram. This may be done every 1-2 years. Talk to your health care provider about how often you should have regular mammograms. Talk with your health care provider about your test results, treatment options, and if necessary, the need for more tests. Vaccines  Your health care provider may recommend certain vaccines, such as: Influenza vaccine. This is recommended every year. Tetanus, diphtheria, and acellular pertussis (Tdap, Td) vaccine. You may need a Td booster every 10 years. Zoster vaccine. You may need this after age 52. Pneumococcal 13-valent conjugate (PCV13) vaccine.  One dose is recommended after age  54. Pneumococcal polysaccharide (PPSV23) vaccine. One dose is recommended after age 80. Talk to your health care provider about which screenings and vaccines you need and how often you need them. This information is not intended to replace advice given to you by your health care provider. Make sure you discuss any questions you have with your health care provider. Document Released: 03/08/2015 Document Revised: 10/30/2015 Document Reviewed: 12/11/2014 Elsevier Interactive Patient Education  2017 [place] Prevention in the Home Falls can cause injuries. They can happen to people of all ages. There are many things you can do to make your home safe and to help prevent falls. What can I do on the outside of my home? Regularly fix the edges of walkways and driveways and fix any cracks. Remove anything that might make you trip as you walk through a door, such as a raised step or threshold. Trim any bushes or trees on the path to your home. Use bright outdoor lighting. Clear any walking paths of anything that might make someone trip, such as rocks or tools. Regularly check to see if handrails are loose or broken. Make sure that both sides of any steps have handrails. Any raised decks and porches should have guardrails on the edges. Have any leaves, snow, or ice cleared regularly. Use sand or salt on walking paths during winter. Clean up any spills in your garage right away. This includes oil or grease spills. What can I do in the bathroom? Use night lights. Install grab bars by the toilet and in the tub and shower. Do not use towel bars as grab bars. Use non-skid mats or decals in the tub or shower. If you need to sit down in the shower, use a plastic, non-slip stool. Keep the floor dry. Clean up any water that spills on the floor as soon as it happens. Remove soap buildup in the tub or shower regularly. Attach bath mats securely with double-sided non-slip rug tape. Do not have throw  rugs and other things on the floor that can make you trip. What can I do in the bedroom? Use night lights. Make sure that you have a light by your bed that is easy to reach. Do not use any sheets or blankets that are too big for your bed. They should not hang down onto the floor. Have a firm chair that has side arms. You can use this for support while you get dressed. Do not have throw rugs and other things on the floor that can make you trip. What can I do in the kitchen? Clean up any spills right away. Avoid walking on wet floors. Keep items that you use a lot in easy-to-reach places. If you need to reach something above you, use a strong step stool that has a grab bar. Keep electrical cords out of the way. Do not use floor polish or wax that makes floors slippery. If you must use wax, use non-skid floor wax. Do not have throw rugs and other things on the floor that can make you trip. What can I do with my stairs? Do not leave any items on the stairs. Make sure that there are handrails on both sides of the stairs and use them. Fix handrails that are broken or loose. Make sure that handrails are as long as the stairways. Check any carpeting to make sure that it is firmly attached to the stairs. Fix any carpet that is loose or  worn. Avoid having throw rugs at the top or bottom of the stairs. If you do have throw rugs, attach them to the floor with carpet tape. Make sure that you have a light switch at the top of the stairs and the bottom of the stairs. If you do not have them, ask someone to add them for you. What else can I do to help prevent falls? Wear shoes that: Do not have high heels. Have rubber bottoms. Are comfortable and fit you well. Are closed at the toe. Do not wear sandals. If you use a stepladder: Make sure that it is fully opened. Do not climb a closed stepladder. Make sure that both sides of the stepladder are locked into place. Ask someone to hold it for you, if  possible. Clearly mark and make sure that you can see: Any grab bars or handrails. First and last steps. Where the edge of each step is. Use tools that help you move around (mobility aids) if they are needed. These include: Canes. Walkers. Scooters. Crutches. Turn on the lights when you go into a dark area. Replace any light bulbs as soon as they burn out. Set up your furniture so you have a clear path. Avoid moving your furniture around. If any of your floors are uneven, fix them. If there are any pets around you, be aware of where they are. Review your medicines with your doctor. Some medicines can make you feel dizzy. This can increase your chance of falling. Ask your doctor what other things that you can do to help prevent falls. This information is not intended to replace advice given to you by your health care provider. Make sure you discuss any questions you have with your health care provider. Document Released: 12/06/2008 Document Revised: 07/18/2015 Document Reviewed: 03/16/2014 Elsevier Interactive Patient Education  2017 [employer].

## 2020-08-28 NOTE — Progress Notes (Signed)
Subjective:   Andrea Martin is a 85 y.o. female who presents for Medicare Annual (Subsequent) preventive examination.  I connected with Sherrilyn today by telephone and verified that I am speaking with the correct person using two identifiers. Location patient: home Location provider: work Persons participating in the virtual visit: patient, Engineer, civil (consulting).    I discussed the limitations, risks, security and privacy concerns of performing an evaluation and management service by telephone and the availability of in person appointments. I also discussed with the patient that there may be a patient responsible charge related to this service. The patient expressed understanding and verbally consented to this telephonic visit.    Interactive audio and video telecommunications were attempted between this provider and patient, however failed, due to patient having technical difficulties OR patient did not have access to video capability.  We continued and completed visit with audio only.  Some vital signs may be absent or patient reported.   Time Spent with patient on telephone encounter: 20 minutes   Review of Systems     Cardiac Risk Factors include: advanced age (>58men, >21 women);dyslipidemia;sedentary lifestyle     Objective:    Today's Vitals   08/28/20 1423  Weight: 156 lb (70.8 kg)  Height: 5\' 6"  (1.676 m)   Body mass index is 25.18 kg/m.  Advanced Directives 08/28/2020 08/28/2019 08/17/2019 08/15/2018  Does Patient Have a Medical Advance Directive? No Yes Yes Yes  Type of Advance Directive - Healthcare Power of [place];Living will Healthcare Power of [place];Living will Healthcare Power of [place];Living will  Does patient want to make changes to medical advance directive? - No - Patient declined No - Patient declined No - Patient declined  Copy of Healthcare Power of Attorney in Chart? - No - copy requested No - copy requested No - copy requested  Would patient like information on  creating a medical advance directive? No - Patient declined - - -    Current Medications (verified) Outpatient Encounter Medications as of 08/28/2020  Medication Sig   ALPRAZolam (XANAX) 0.5 MG tablet TAKE 1/2 - 1 TABLET BY MOUTH 3 TIMES DAILY AS NEEDED FOR ANXIETY   azelastine (ASTELIN) 0.1 % nasal spray Place 2 sprays into both nostrils at bedtime as needed for rhinitis. Use in each nostril as directed   buPROPion (WELLBUTRIN SR) 150 MG 12 hr tablet Take 2 tablets (300 mg total) by mouth daily.   calcium carbonate (OS-CAL) 600 MG TABS Take 600 mg by mouth daily.   Cholecalciferol (VITAMIN D-3) 1000 UNITS CAPS Take 1 capsule by mouth daily.   COVID-19 mRNA Vac-TriS, Pfizer, (PFIZER-BIONT COVID-19 VAC-TRIS) SUSP injection Inject into the muscle.   escitalopram (LEXAPRO) 20 MG tablet Take 1 tablet (20 mg total) by mouth daily.   fish oil-omega-3 fatty acids 1000 MG capsule Take 1 g by mouth daily.   glucosamine-chondroitin 500-400 MG tablet Take 1 tablet by mouth daily.   losartan (COZAAR) 50 MG tablet Take 1 tablet (50 mg total) by mouth daily.   meclizine (ANTIVERT) 12.5 MG tablet Take 1 tablet (12.5 mg total) by mouth 3 (three) times daily as needed for dizziness.   Multiple Vitamin (MULTIVITAMIN) tablet Take 1 tablet by mouth daily.   pravastatin (PRAVACHOL) 20 MG tablet Take 1 tablet (20 mg total) by mouth at bedtime.   Probiotic Product (PROBIOTIC DAILY PO) Take by mouth.   No facility-administered encounter medications on file as of 08/28/2020.    Allergies (verified) Codeine, Sulfa antibiotics, and Penicillins  History: Past Medical History:  Diagnosis Date   Anxiety and depression    Gait disorder    Hyperlipidemia    Neuropathy 08/04/2013   Shoulder dislocation    h/o after a fall   Past Surgical History:  Procedure Laterality Date   NO PAST SURGERIES     Family History  Problem Relation Age of Onset   Heart disease Father        CAD?   Coronary artery disease  Brother 50   Heart disease Mother    Breast cancer Neg Hx    Colon cancer Neg Hx    Diabetes Neg Hx    Stroke Neg Hx    Social History   Socioeconomic History   Marital status: Married    Spouse name: Not on file   Number of children: 1   Years of education: Not on file   Highest education level: Not on file  Occupational History   Occupation: retired  Tobacco Use   Smoking status: Never   Smokeless tobacco: Never  Vaping Use   Vaping Use: Never used  Substance and Sexual Activity   Alcohol use: No   Drug use: No   Sexual activity: Not on file  Other Topics Concern   Not on file  Social History Narrative   Lives w/ husband , share the house w/ daughter and one of her 2 children    Social Determinants of Health   Financial Resource Strain: Low Risk    Difficulty of Paying Living Expenses: Not hard at all  Food Insecurity: No Food Insecurity   Worried About Programme researcher, broadcasting/film/video in the Last Year: Never true   Barista in the Last Year: Never true  Transportation Needs: No Transportation Needs   Lack of Transportation (Medical): No   Lack of Transportation (Non-Medical): No  Physical Activity: Inactive   Days of Exercise per Week: 0 days   Minutes of Exercise per Session: 0 min  Stress: No Stress Concern Present   Feeling of Stress : Not at all  Social Connections: Socially Integrated   Frequency of Communication with Friends and Family: More than three times a week   Frequency of Social Gatherings with Friends and Family: More than three times a week   Attends Religious Services: 1 to 4 times per year   Active Member of [employer] or Organizations: Yes   Attends Banker Meetings: 1 to 4 times per year   Marital Status: Married    Tobacco Counseling Counseling given: Not Answered   Clinical Intake:  Pre-visit preparation completed: Yes  Pain : No/denies pain     Nutritional Status: BMI 25 -29 Overweight Nutritional Risks: None Diabetes:  No  How often do you need to have someone help you when you read instructions, pamphlets, or other written materials from your doctor or pharmacy?: 1 - Never  Diabetic?No  Interpreter Needed?: No  Information entered by :: Thomasenia Sales LPN   Activities of Daily Living In your present state of health, do you have any difficulty performing the following activities: 08/28/2020  Hearing? N  Vision? N  Difficulty concentrating or making decisions? N  Walking or climbing stairs? N  Dressing or bathing? N  Doing errands, shopping? N  Preparing Food and eating ? N  Using the Toilet? N  In the past six months, have you accidently leaked urine? N  Do you have problems with loss of bowel control? N  Managing your Medications?  N  Managing your Finances? N  Housekeeping or managing your Housekeeping? N  Some recent data might be hidden    Patient Care Team: Wanda Plump, MD as PCP - General (Internal Medicine) Ranee Gosselin, MD as Consulting Physician (Orthopedic Surgery) Vida Rigger, MD as Consulting Physician (Gastroenterology)  Indicate any recent Medical Services you may have received from other than [facility] providers in the past year (date may be approximate).     Assessment:   This is a routine wellness examination for [place].  Hearing/Vision screen Hearing Screening - Comments:: No issues Vision Screening - Comments:: Wears glasses Last eye exam-2021-[facility]  Dietary issues and exercise activities discussed: Current Exercise Habits: The patient does not participate in regular exercise at present, Exercise limited by: None identified   Goals Addressed             This Visit's Progress    Decrease pepsi to 1 per day   On track    DIET - INCREASE WATER INTAKE   Not on track    Patient Stated       Increase activity        Depression Screen [facility] 2/9 Scores 08/28/2020 07/24/2020 05/03/2020 11/07/2019 08/17/2019 07/19/2019 05/09/2019  PHQ - 2 Score 1 4 1 1 1 3  0  PHQ-  9 Score - 15 3 1  - 19 0    Fall Risk Fall Risk  08/28/2020 05/03/2020 08/17/2019 05/09/2019 08/15/2018  Falls in the past year? 1 0 0 1 0  Number falls in past yr: 1 0 0 0 -  Injury with Fall? 0 0 0 0 -  Risk for fall due to : History of fall(s) - - - -  Follow up Falls prevention discussed - Education provided;Falls prevention discussed Falls evaluation completed -    FALL RISK PREVENTION PERTAINING TO THE HOME:  Any stairs in or around the home? Yes  If so, are there any without handrails? No  Home free of loose throw rugs in walkways, pet beds, electrical cords, etc? Yes  Adequate lighting in your home to reduce risk of falls? Yes   ASSISTIVE DEVICES UTILIZED TO PREVENT FALLS:  Life alert? No  Use of a cane, walker or w/c? No  Grab bars in the bathroom? No  Shower chair or bench in shower? No  Elevated toilet seat or a handicapped toilet? No   TIMED UP AND GO:  Was the test performed? No . Phone visit   Cognitive Function:Normal cognitive status assessed by this Nurse Health Advisor. No abnormalities found.          Immunizations Immunization History  Administered Date(s) Administered   Fluad Quad(high Dose 65+) 12/19/2019   Influenza Split 12/25/2011, 11/19/2018   Influenza, High Dose Seasonal PF 12/09/2012, 11/29/2015   Influenza,inj,Quad PF,6+ Mos 12/08/2013   Influenza-Unspecified 12/24/2014, 12/10/2016, 12/29/2017   PFIZER Comirnaty(Gray Top)Covid-19 Tri-Sucrose Vaccine 07/30/2020   PFIZER(Purple Top)SARS-COV-2 Vaccination 03/16/2019, 04/06/2019, 02/22/2020   Pneumococcal Conjugate-13 08/04/2013   Pneumococcal Polysaccharide-23 01/23/2010, 05/04/2018   Td 04/27/2011   Zoster Recombinat (Shingrix) 11/03/2018, 01/13/2019   Zoster, Live 04/23/2009    TDAP status: Up to date  Flu Vaccine status: Up to date  Pneumococcal vaccine status: Up to date  Covid-19 vaccine status: Completed vaccines  Qualifies for Shingles Vaccine? No   Zostavax completed No    Shingrix Completed?: Yes  Screening Tests Health Maintenance  Topic Date Due   INFLUENZA VACCINE  09/23/2020   COVID-19 Vaccine (5 - Booster for 06/23/2009  series) 11/29/2020   TETANUS/TDAP  04/26/2021   DEXA SCAN  Completed   PNA vac Low Risk Adult  Completed   Zoster Vaccines- Shingrix  Completed   HPV VACCINES  Aged Out    Health Maintenance  There are no preventive care reminders to display for this patient.  Colorectal cancer screening: No longer required.   Mammogram status: Due-Patient states she will call when she is ready to schedule  Bone Density status: Completed 11/16/2018. Results reflect: Bone density results: OSTEOPENIA. Repeat every 2 years.  Lung Cancer Screening: (Low Dose CT Chest recommended if Age 90-80 years, 30 pack-year currently smoking OR have quit w/in 15years.) does not qualify.     Additional Screening:  Hepatitis C Screening: does not qualify  Vision Screening: Recommended annual ophthalmology exams for early detection of glaucoma and other disorders of the eye. Is the patient up to date with their annual eye exam?  Yes  Who is the provider or what is the name of the office in which the patient attends annual eye exams? [facility]    Dental Screening: Recommended annual dental exams for proper oral hygiene  Community Resource Referral / Chronic Care Management: CRR required this visit?  No   CCM required this visit?  No      Plan:     I have personally reviewed and noted the following in the patient's chart:   Medical and social history Use of alcohol, tobacco or illicit drugs  Current medications and supplements including opioid prescriptions.  Functional ability and status Nutritional status Physical activity Advanced directives List of other physicians Hospitalizations, surgeries, and ER visits in previous 12 months Vitals Screenings to include cognitive, depression, and falls Referrals and appointments  In addition, I  have reviewed and discussed with patient certain preventive protocols, quality metrics, and best practice recommendations. A written personalized care plan for preventive services as well as general preventive health recommendations were provided to patient.   Due to this being a telephonic visit, the after visit summary with patients personalized plan was offered to patient via mail or my-chart. Patient would like to access on my-chart.    Roanna RaiderMartha A Ruari Mudgett, LPN   1/6/10967/07/2020  Nurse Health Advisor  Nurse Notes: None

## 2020-08-30 ENCOUNTER — Other Ambulatory Visit (HOSPITAL_BASED_OUTPATIENT_CLINIC_OR_DEPARTMENT_OTHER): Payer: Self-pay

## 2020-08-31 ENCOUNTER — Emergency Department (HOSPITAL_COMMUNITY): Payer: Medicare Other

## 2020-08-31 ENCOUNTER — Encounter (HOSPITAL_COMMUNITY): Payer: Self-pay

## 2020-08-31 ENCOUNTER — Other Ambulatory Visit: Payer: Self-pay

## 2020-08-31 ENCOUNTER — Inpatient Hospital Stay (HOSPITAL_COMMUNITY)
Admission: EM | Admit: 2020-08-31 | Discharge: 2020-09-04 | DRG: 200 | Disposition: A | Discharge status: Home Health | Payer: Medicare Other | Attending: Internal Medicine | Admitting: Internal Medicine

## 2020-08-31 DIAGNOSIS — R6889 Other general symptoms and signs: Secondary | ICD-10-CM | POA: Diagnosis not present

## 2020-08-31 DIAGNOSIS — Z882 Allergy status to sulfonamides status: Secondary | ICD-10-CM | POA: Diagnosis not present

## 2020-08-31 DIAGNOSIS — J9811 Atelectasis: Secondary | ICD-10-CM | POA: Diagnosis not present

## 2020-08-31 DIAGNOSIS — Z79899 Other long term (current) drug therapy: Secondary | ICD-10-CM | POA: Diagnosis not present

## 2020-08-31 DIAGNOSIS — F419 Anxiety disorder, unspecified: Secondary | ICD-10-CM | POA: Diagnosis present

## 2020-08-31 DIAGNOSIS — Z8673 Personal history of transient ischemic attack (TIA), and cerebral infarction without residual deficits: Secondary | ICD-10-CM

## 2020-08-31 DIAGNOSIS — S2241XA Multiple fractures of ribs, right side, initial encounter for closed fracture: Secondary | ICD-10-CM | POA: Diagnosis not present

## 2020-08-31 DIAGNOSIS — I10 Essential (primary) hypertension: Secondary | ICD-10-CM | POA: Diagnosis present

## 2020-08-31 DIAGNOSIS — S2241XD Multiple fractures of ribs, right side, subsequent encounter for fracture with routine healing: Secondary | ICD-10-CM | POA: Diagnosis not present

## 2020-08-31 DIAGNOSIS — R0781 Pleurodynia: Secondary | ICD-10-CM | POA: Diagnosis not present

## 2020-08-31 DIAGNOSIS — S270XXA Traumatic pneumothorax, initial encounter: Secondary | ICD-10-CM | POA: Diagnosis not present

## 2020-08-31 DIAGNOSIS — Z743 Need for continuous supervision: Secondary | ICD-10-CM | POA: Diagnosis not present

## 2020-08-31 DIAGNOSIS — R410 Disorientation, unspecified: Secondary | ICD-10-CM | POA: Diagnosis not present

## 2020-08-31 DIAGNOSIS — I700 Atherosclerosis of aorta: Secondary | ICD-10-CM | POA: Diagnosis not present

## 2020-08-31 DIAGNOSIS — T797XXA Traumatic subcutaneous emphysema, initial encounter: Secondary | ICD-10-CM | POA: Diagnosis not present

## 2020-08-31 DIAGNOSIS — Z885 Allergy status to narcotic agent status: Secondary | ICD-10-CM

## 2020-08-31 DIAGNOSIS — J939 Pneumothorax, unspecified: Secondary | ICD-10-CM

## 2020-08-31 DIAGNOSIS — S2249XA Multiple fractures of ribs, unspecified side, initial encounter for closed fracture: Secondary | ICD-10-CM | POA: Diagnosis not present

## 2020-08-31 DIAGNOSIS — Y92009 Unspecified place in unspecified non-institutional (private) residence as the place of occurrence of the external cause: Secondary | ICD-10-CM

## 2020-08-31 DIAGNOSIS — J439 Emphysema, unspecified: Secondary | ICD-10-CM | POA: Diagnosis not present

## 2020-08-31 DIAGNOSIS — Z880 Allergy status to penicillin: Secondary | ICD-10-CM

## 2020-08-31 DIAGNOSIS — E785 Hyperlipidemia, unspecified: Secondary | ICD-10-CM | POA: Diagnosis not present

## 2020-08-31 DIAGNOSIS — Z9181 History of falling: Secondary | ICD-10-CM | POA: Diagnosis not present

## 2020-08-31 DIAGNOSIS — R0902 Hypoxemia: Secondary | ICD-10-CM | POA: Diagnosis present

## 2020-08-31 DIAGNOSIS — I2510 Atherosclerotic heart disease of native coronary artery without angina pectoris: Secondary | ICD-10-CM | POA: Diagnosis present

## 2020-08-31 DIAGNOSIS — F32A Depression, unspecified: Secondary | ICD-10-CM | POA: Diagnosis present

## 2020-08-31 DIAGNOSIS — W1809XA Striking against other object with subsequent fall, initial encounter: Secondary | ICD-10-CM | POA: Diagnosis present

## 2020-08-31 DIAGNOSIS — Z20822 Contact with and (suspected) exposure to covid-19: Secondary | ICD-10-CM | POA: Diagnosis present

## 2020-08-31 DIAGNOSIS — I499 Cardiac arrhythmia, unspecified: Secondary | ICD-10-CM | POA: Diagnosis not present

## 2020-08-31 DIAGNOSIS — W19XXXA Unspecified fall, initial encounter: Secondary | ICD-10-CM

## 2020-08-31 DIAGNOSIS — J982 Interstitial emphysema: Secondary | ICD-10-CM | POA: Diagnosis not present

## 2020-08-31 LAB — CBC
HCT: 38.5 % (ref 36.0–46.0)
Hemoglobin: 12.9 g/dL (ref 12.0–15.0)
MCH: 32.8 pg (ref 26.0–34.0)
MCHC: 33.5 g/dL (ref 30.0–36.0)
MCV: 98 fL (ref 80.0–100.0)
Platelets: 213 10*3/uL (ref 150–400)
RBC: 3.93 MIL/uL (ref 3.87–5.11)
RDW: 12.2 % (ref 11.5–15.5)
WBC: 11.1 10*3/uL — ABNORMAL HIGH (ref 4.0–10.5)
nRBC: 0 % (ref 0.0–0.2)

## 2020-08-31 LAB — BASIC METABOLIC PANEL
Anion gap: 3 — ABNORMAL LOW (ref 5–15)
BUN: 18 mg/dL (ref 8–23)
CO2: 29 mmol/L (ref 22–32)
Calcium: 9.1 mg/dL (ref 8.9–10.3)
Chloride: 109 mmol/L (ref 98–111)
Creatinine, Ser: 0.94 mg/dL (ref 0.44–1.00)
GFR, Estimated: >60 mL/min (ref >60)
Glucose, Bld: 125 mg/dL — ABNORMAL HIGH (ref 70–99)
Potassium: 3.7 mmol/L (ref 3.5–5.1)
Sodium: 141 mmol/L (ref 135–145)

## 2020-08-31 MED ORDER — FENTANYL CITRATE (PF) 100 MCG/2ML IJ SOLN
50.0000 ug | Freq: Once | INTRAMUSCULAR | Status: AC
  Administered 2020-08-31: 50 ug via INTRAVENOUS
  Filled 2020-08-31: qty 2

## 2020-08-31 MED ORDER — ONDANSETRON HCL 4 MG/2ML IJ SOLN
4.0000 mg | Freq: Once | INTRAMUSCULAR | Status: AC
  Administered 2020-08-31: 4 mg via INTRAVENOUS
  Filled 2020-08-31: qty 2

## 2020-08-31 MED ORDER — DICLOFENAC EPOLAMINE 1.3 % EX PTCH
1.0000 | MEDICATED_PATCH | Freq: Two times a day (BID) | CUTANEOUS | Status: DC
  Administered 2020-08-31 – 2020-09-04 (×7): 1 via TRANSDERMAL
  Filled 2020-08-31 (×9): qty 1

## 2020-08-31 NOTE — ED Provider Notes (Signed)
[facility]-EMERGENCY DEPT Provider Note   CSN: [id number] Arrival date & time: 08/31/20  2034     History Chief Complaint  Patient presents with   Fall   Rib Injury    Andrea Martin is a 85 y.o. female.  HPI Presents after a fall.  She is here with her daughter who assists with the history.  She notes that she was falling, mechanical, Plan in an effort to avoid hitting her head she twisted, and struck her right posterior thoracic region against the chair.  Since that time she has had severe sustained pain in that area with dyspnea, worse with inspiration, secondary to pain.  Pain is improved after receiving fentanyl in route.  No other injuries, no other complaints.    Past Medical History:  Diagnosis Date   Anxiety and depression    Gait disorder    Hyperlipidemia    Neuropathy 08/04/2013   Shoulder dislocation    h/o after a fall    Patient Active Problem List   Diagnosis Date Noted   Remote history of stroke 12/21/2019   Tremor 08/11/2017   Essential tremor 11/29/2015   PCP NOTES >>>>>>>>>>>>>>>>>>>>>>>> 06/04/2015   Neuropathy 08/04/2013   Annual physical exam 08/01/2012   Hyperlipidemia 11/03/2011   Anxiety and depression 11/03/2011   Gait disorder 11/03/2011    Past Surgical History:  Procedure Laterality Date   NO PAST SURGERIES       OB History   No obstetric history on file.     Family History  Problem Relation Age of Onset   Heart disease Father        CAD?   Coronary artery disease Brother 50   Heart disease Mother    Breast cancer Neg Hx    Colon cancer Neg Hx    Diabetes Neg Hx    Stroke Neg Hx     Social History   Tobacco Use   Smoking status: Never   Smokeless tobacco: Never  Vaping Use   Vaping Use: Never used  Substance Use Topics   Alcohol use: No   Drug use: No    Home Medications Prior to Admission medications   Medication Sig Start Date End Date Taking? Authorizing Provider  ALPRAZolam Prudy Feeler)  0.5 MG tablet TAKE 1/2 - 1 TABLET BY MOUTH 3 TIMES DAILY AS NEEDED FOR ANXIETY 08/06/20 02/02/21  Wanda Plump, MD  azelastine (ASTELIN) 0.1 % nasal spray Place 2 sprays into both nostrils at bedtime as needed for rhinitis. Use in each nostril as directed 12/15/17   Wanda Plump, MD  buPROPion [facility] SR) 150 MG 12 hr tablet Take 2 tablets (300 mg total) by mouth daily. 05/06/20   Wanda Plump, MD  calcium carbonate (OS-CAL) 600 MG TABS Take 600 mg by mouth daily.    [provider]  Cholecalciferol (VITAMIN D-3) 1000 UNITS CAPS Take 1 capsule by mouth daily.    [provider]  COVID-19 mRNA Vac-TriS, Pfizer, (PFIZER-BIONT COVID-19 VAC-TRIS) SUSP injection Inject into the muscle. 07/30/20   Judyann Munson, MD  escitalopram (LEXAPRO) 20 MG tablet Take 1 tablet (20 mg total) by mouth daily. 05/06/20   Wanda Plump, MD  fish oil-omega-3 fatty acids 1000 MG capsule Take 1 g by mouth daily.    [provider]  glucosamine-chondroitin 500-400 MG tablet Take 1 tablet by mouth daily.    [provider]  losartan (COZAAR) 50 MG tablet Take 1 tablet (50 mg total) by  mouth daily. 08/27/20   Wanda Plump, MD  meclizine (ANTIVERT) 12.5 MG tablet Take 1 tablet (12.5 mg total) by mouth 3 (three) times daily as needed for dizziness. 11/03/19   Saguier, Ramon Dredge, PA-C  Multiple Vitamin (MULTIVITAMIN) tablet Take 1 tablet by mouth daily.    [provider]  pravastatin (PRAVACHOL) 20 MG tablet Take 1 tablet (20 mg total) by mouth at bedtime. 08/27/20   Wanda Plump, MD  Probiotic Product (PROBIOTIC DAILY PO) Take by mouth.    [provider]    Allergies    Codeine, Sulfa antibiotics, and Penicillins  Review of Systems   Review of Systems  Constitutional:        Per HPI, otherwise negative  HENT:         Per HPI, otherwise negative  Respiratory:         Per HPI, otherwise negative  Cardiovascular:        Per HPI, otherwise negative  Gastrointestinal:  Negative for  vomiting.  Endocrine:       Negative aside from HPI  Genitourinary:        Neg aside from HPI   Musculoskeletal:        Per HPI, otherwise negative  Skin: Negative.   Neurological:  Negative for syncope.   Physical Exam Updated Vital Signs BP (!) 206/75   Pulse 64   Temp 98.4 F (36.9 C) (Oral)   Resp 15   SpO2 97%   Physical Exam Vitals and nursing note reviewed.  Constitutional:      General: She is not in acute distress.    Appearance: She is well-developed.  HENT:     Head: Normocephalic and atraumatic.  Eyes:     Conjunctiva/sclera: Conjunctivae normal.  Cardiovascular:     Rate and Rhythm: Normal rate and regular rhythm.  Pulmonary:     Effort: Pulmonary effort is normal. No respiratory distress.     Breath sounds: Normal breath sounds. No stridor.  Abdominal:     General: There is no distension.  Musculoskeletal:     Comments: Tender to palpation in the right medial subscapular region with guarding  Skin:    General: Skin is warm and dry.  Neurological:     Mental Status: She is alert and oriented to person, place, and time.     Cranial Nerves: No cranial nerve deficit.    ED Results / Procedures / Treatments   Labs (all labs ordered are listed, but only abnormal results are displayed) Labs Reviewed  CBC - Abnormal; Notable for the following components:      Result Value   WBC 11.1 (*)    All other components within normal limits  SARS CORONAVIRUS 2 (TAT 6-24 HRS)  BASIC METABOLIC PANEL    EKG None  Radiology DG Ribs Unilateral W/Chest Right  Result Date: 08/31/2020 CLINICAL DATA:  Right-sided rib pain following fall, initial encounter EXAM: RIGHT RIBS AND CHEST - 3+ VIEW COMPARISON:  None. FINDINGS: Multiple right-sided rib fractures are noted to include the third through seventh ribs. The fractures of the sixth and seventh ribs are significantly displaced. Multifocal fractures in the fifth and sixth ribs are noted posteriorly. No underlying  pneumothorax is seen. Some subcutaneous emphysema is noted. Increased basilar opacity is noted on the right likely related to contusion. No other focal abnormality is noted. IMPRESSION: Multiple right-sided rib fractures with underlying contusion. No definitive pneumothorax is seen although some subcutaneous emphysema is noted. Electronically Signed  By: Alcide Clever M.D.   On: 08/31/2020 21:52    Procedures Procedures   Medications Ordered in ED Medications  diclofenac (FLECTOR) 1.3 % 1 patch (has no administration in time range)  fentaNYL (SUBLIMAZE) injection 50 mcg (has no administration in time range)  ondansetron (ZOFRAN) injection 4 mg (has no administration in time range)    ED Course  I have reviewed the triage vital signs and the nursing notes.  Pertinent labs & imaging results that were available during my care of the patient were reviewed by me and considered in my medical decision making (see chart for details).  X-ray reviewed, notable for fractures of ribs 62952 with subcutaneous emphysema.  CT scan pending.  MDM Rules/Calculators/A&P Female presents after mechanical fall during which she sustained fracture of multiple ribs.  With ongoing pain, multiple rib fractures, but no evidence for obvious pneumothorax, nor other injuries, the patient still requires admission for further monitoring, management, pain medication.  11:12 PM Continues to require pain medication. I have reviewed her initial x-ray, notable for fracture multiple ribs, no obvious pneumothorax, but there is subcutaneous emphysema, and given her substantial pain, multiple rib fracture, CT scan has been ordered. Patient's initial labs reassuring, but given concern for rib fracture she will require admission, pending based on CT scan results, consideration of pneumothorax versus only multiple rib fractures. Dr. Nicanor Alcon is aware. Final Clinical Impression(s) / ED Diagnoses Final diagnoses:  Fall, initial  encounter  Closed fracture of multiple ribs of right side, initial encounter     Gerhard Munch, MD 08/31/20 2314

## 2020-08-31 NOTE — ED Triage Notes (Signed)
Per EMS pt was walking at 7pm and fell and hit her upper right back against the corner of the piano. Pt has pain to right rib, should, mid axillary, and arm. Denies hitting head. No LOC. Axo x4 for EMS.  Pt has pain while breathing.   20G LAC given of fent. Pt denies pain on arrival.  BP 196/94 O2 97% RA HR 60 RR 22

## 2020-09-01 ENCOUNTER — Inpatient Hospital Stay (HOSPITAL_COMMUNITY): Payer: Medicare Other

## 2020-09-01 ENCOUNTER — Encounter (HOSPITAL_COMMUNITY): Payer: Self-pay | Admitting: Surgery

## 2020-09-01 DIAGNOSIS — I10 Essential (primary) hypertension: Secondary | ICD-10-CM | POA: Diagnosis present

## 2020-09-01 DIAGNOSIS — Y92009 Unspecified place in unspecified non-institutional (private) residence as the place of occurrence of the external cause: Secondary | ICD-10-CM | POA: Insufficient documentation

## 2020-09-01 DIAGNOSIS — S2241XA Multiple fractures of ribs, right side, initial encounter for closed fracture: Secondary | ICD-10-CM | POA: Diagnosis present

## 2020-09-01 DIAGNOSIS — F32A Depression, unspecified: Secondary | ICD-10-CM | POA: Diagnosis present

## 2020-09-01 DIAGNOSIS — Z885 Allergy status to narcotic agent status: Secondary | ICD-10-CM | POA: Diagnosis not present

## 2020-09-01 DIAGNOSIS — J939 Pneumothorax, unspecified: Secondary | ICD-10-CM | POA: Diagnosis not present

## 2020-09-01 DIAGNOSIS — Z20822 Contact with and (suspected) exposure to covid-19: Secondary | ICD-10-CM | POA: Diagnosis present

## 2020-09-01 DIAGNOSIS — W19XXXA Unspecified fall, initial encounter: Secondary | ICD-10-CM | POA: Insufficient documentation

## 2020-09-01 DIAGNOSIS — Z8673 Personal history of transient ischemic attack (TIA), and cerebral infarction without residual deficits: Secondary | ICD-10-CM | POA: Diagnosis not present

## 2020-09-01 DIAGNOSIS — S2241XD Multiple fractures of ribs, right side, subsequent encounter for fracture with routine healing: Secondary | ICD-10-CM | POA: Diagnosis not present

## 2020-09-01 DIAGNOSIS — Z79899 Other long term (current) drug therapy: Secondary | ICD-10-CM | POA: Diagnosis not present

## 2020-09-01 DIAGNOSIS — T797XXA Traumatic subcutaneous emphysema, initial encounter: Secondary | ICD-10-CM | POA: Diagnosis present

## 2020-09-01 DIAGNOSIS — Z8781 Personal history of (healed) traumatic fracture: Secondary | ICD-10-CM

## 2020-09-01 DIAGNOSIS — F419 Anxiety disorder, unspecified: Secondary | ICD-10-CM | POA: Diagnosis present

## 2020-09-01 DIAGNOSIS — S2249XA Multiple fractures of ribs, unspecified side, initial encounter for closed fracture: Secondary | ICD-10-CM

## 2020-09-01 DIAGNOSIS — I2510 Atherosclerotic heart disease of native coronary artery without angina pectoris: Secondary | ICD-10-CM | POA: Diagnosis present

## 2020-09-01 DIAGNOSIS — W1809XA Striking against other object with subsequent fall, initial encounter: Secondary | ICD-10-CM | POA: Diagnosis present

## 2020-09-01 DIAGNOSIS — Z880 Allergy status to penicillin: Secondary | ICD-10-CM | POA: Diagnosis not present

## 2020-09-01 DIAGNOSIS — R410 Disorientation, unspecified: Secondary | ICD-10-CM | POA: Diagnosis not present

## 2020-09-01 DIAGNOSIS — R0902 Hypoxemia: Secondary | ICD-10-CM | POA: Diagnosis present

## 2020-09-01 DIAGNOSIS — I639 Cerebral infarction, unspecified: Secondary | ICD-10-CM | POA: Insufficient documentation

## 2020-09-01 DIAGNOSIS — Z9181 History of falling: Secondary | ICD-10-CM | POA: Diagnosis not present

## 2020-09-01 DIAGNOSIS — Z882 Allergy status to sulfonamides status: Secondary | ICD-10-CM | POA: Diagnosis not present

## 2020-09-01 DIAGNOSIS — E785 Hyperlipidemia, unspecified: Secondary | ICD-10-CM | POA: Diagnosis present

## 2020-09-01 DIAGNOSIS — S270XXA Traumatic pneumothorax, initial encounter: Secondary | ICD-10-CM | POA: Diagnosis present

## 2020-09-01 HISTORY — DX: Personal history of (healed) traumatic fracture: Z87.81

## 2020-09-01 HISTORY — DX: Essential (primary) hypertension: I10

## 2020-09-01 LAB — CBC
HCT: 36.4 % (ref 36.0–46.0)
Hemoglobin: 12 g/dL (ref 12.0–15.0)
MCH: 32.3 pg (ref 26.0–34.0)
MCHC: 33 g/dL (ref 30.0–36.0)
MCV: 97.8 fL (ref 80.0–100.0)
Platelets: 115 10*3/uL — ABNORMAL LOW (ref 150–400)
RBC: 3.72 MIL/uL — ABNORMAL LOW (ref 3.87–5.11)
RDW: 12.2 % (ref 11.5–15.5)
WBC: 6.8 10*3/uL (ref 4.0–10.5)
nRBC: 0 % (ref 0.0–0.2)

## 2020-09-01 LAB — SARS CORONAVIRUS 2 (TAT 6-24 HRS): SARS Coronavirus 2: NEGATIVE

## 2020-09-01 MED ORDER — METHOCARBAMOL 1000 MG/10ML IJ SOLN
1000.0000 mg | Freq: Four times a day (QID) | INTRAVENOUS | Status: DC | PRN
  Filled 2020-09-01: qty 10

## 2020-09-01 MED ORDER — MAGIC MOUTHWASH
15.0000 mL | Freq: Four times a day (QID) | ORAL | Status: DC | PRN
  Filled 2020-09-01: qty 15

## 2020-09-01 MED ORDER — HYDRALAZINE HCL 20 MG/ML IJ SOLN: 5.0000 mg | INTRAMUSCULAR | Status: DC | PRN

## 2020-09-01 MED ORDER — TRAMADOL HCL 50 MG PO TABS: 50.0000 mg | ORAL_TABLET | Freq: Four times a day (QID) | ORAL | Status: DC | PRN

## 2020-09-01 MED ORDER — AZELASTINE HCL 0.1 % NA SOLN: 2.0000 | Freq: Every evening | NASAL | Status: DC | PRN

## 2020-09-01 MED ORDER — MECLIZINE HCL 25 MG PO TABS: 12.5000 mg | ORAL_TABLET | Freq: Three times a day (TID) | ORAL | Status: DC | PRN

## 2020-09-01 MED ORDER — ALPRAZOLAM 0.25 MG PO TABS
0.2500 mg | ORAL_TABLET | Freq: Every day | ORAL | Status: DC | PRN
Start: 2020-09-01 — End: 2020-09-03
  Administered 2020-09-03: 0.25 mg via ORAL
  Filled 2020-09-01: qty 1

## 2020-09-01 MED ORDER — ALPRAZOLAM 0.25 MG PO TABS
0.2500 mg | ORAL_TABLET | Freq: Two times a day (BID) | ORAL | Status: DC
  Administered 2020-09-01 – 2020-09-02 (×4): 0.5 mg via ORAL
  Administered 2020-09-03: 0.25 mg via ORAL
  Administered 2020-09-03: 0.5 mg via ORAL
  Administered 2020-09-04: 0.25 mg via ORAL
  Filled 2020-09-01: qty 2
  Filled 2020-09-01: qty 1
  Filled 2020-09-01 (×5): qty 2

## 2020-09-01 MED ORDER — OXYCODONE HCL 5 MG PO TABS
5.0000 mg | ORAL_TABLET | ORAL | Status: DC | PRN
  Administered 2020-09-01: 5 mg via ORAL
  Administered 2020-09-01 – 2020-09-03 (×4): 10 mg via ORAL
  Administered 2020-09-04: 5 mg via ORAL
  Filled 2020-09-01 (×2): qty 2
  Filled 2020-09-01: qty 1
  Filled 2020-09-01: qty 2
  Filled 2020-09-01: qty 1
  Filled 2020-09-01: qty 2

## 2020-09-01 MED ORDER — LIP MEDEX EX OINT
1.0000 "application " | TOPICAL_OINTMENT | Freq: Two times a day (BID) | CUTANEOUS | Status: DC
  Administered 2020-09-01 – 2020-09-04 (×7): 1 via TOPICAL
  Filled 2020-09-01: qty 7

## 2020-09-01 MED ORDER — ALBUTEROL SULFATE (2.5 MG/3ML) 0.083% IN NEBU: 2.5000 mg | INHALATION_SOLUTION | Freq: Four times a day (QID) | RESPIRATORY_TRACT | Status: DC | PRN

## 2020-09-01 MED ORDER — ACETAMINOPHEN 500 MG PO TABS
1000.0000 mg | ORAL_TABLET | Freq: Four times a day (QID) | ORAL | Status: DC
  Administered 2020-09-01 – 2020-09-04 (×12): 1000 mg via ORAL
  Filled 2020-09-01 (×12): qty 2

## 2020-09-01 MED ORDER — ALUM & MAG HYDROXIDE-SIMETH 200-200-20 MG/5ML PO SUSP: 30.0000 mL | Freq: Four times a day (QID) | ORAL | Status: DC | PRN

## 2020-09-01 MED ORDER — LOSARTAN POTASSIUM 50 MG PO TABS
50.0000 mg | ORAL_TABLET | Freq: Every morning | ORAL | Status: DC
  Administered 2020-09-02 – 2020-09-04 (×3): 50 mg via ORAL
  Filled 2020-09-01 (×3): qty 1

## 2020-09-01 MED ORDER — SODIUM CHLORIDE 0.9 % IV SOLN
8.0000 mg | Freq: Four times a day (QID) | INTRAVENOUS | Status: DC | PRN
  Filled 2020-09-01: qty 4

## 2020-09-01 MED ORDER — ONDANSETRON HCL 4 MG/2ML IJ SOLN
4.0000 mg | Freq: Four times a day (QID) | INTRAMUSCULAR | Status: DC | PRN
  Filled 2020-09-01: qty 2

## 2020-09-01 MED ORDER — ESCITALOPRAM OXALATE 20 MG PO TABS
20.0000 mg | ORAL_TABLET | Freq: Every morning | ORAL | Status: DC
  Administered 2020-09-02 – 2020-09-04 (×3): 20 mg via ORAL
  Filled 2020-09-01 (×3): qty 1

## 2020-09-01 MED ORDER — PRAVASTATIN SODIUM 10 MG PO TABS
20.0000 mg | ORAL_TABLET | Freq: Every day | ORAL | Status: DC
  Administered 2020-09-01 – 2020-09-03 (×4): 20 mg via ORAL
  Filled 2020-09-01: qty 1
  Filled 2020-09-01 (×3): qty 2

## 2020-09-01 MED ORDER — METHOCARBAMOL 500 MG PO TABS
1000.0000 mg | ORAL_TABLET | Freq: Four times a day (QID) | ORAL | Status: DC | PRN
  Administered 2020-09-03: 1000 mg via ORAL
  Filled 2020-09-01: qty 2

## 2020-09-01 MED ORDER — BUPROPION HCL ER (SR) 150 MG PO TB12
300.0000 mg | ORAL_TABLET | Freq: Every morning | ORAL | Status: DC
  Administered 2020-09-02 – 2020-09-04 (×3): 300 mg via ORAL
  Filled 2020-09-01 (×3): qty 2

## 2020-09-01 MED ORDER — ADULT MULTIVITAMIN W/MINERALS CH
1.0000 | ORAL_TABLET | Freq: Every morning | ORAL | Status: DC
  Administered 2020-09-02 – 2020-09-04 (×3): 1 via ORAL
  Filled 2020-09-01 (×3): qty 1

## 2020-09-01 MED ORDER — ALBUMIN HUMAN 5 % IV SOLN
12.5000 g | Freq: Four times a day (QID) | INTRAVENOUS | Status: DC | PRN
  Filled 2020-09-01: qty 250

## 2020-09-01 MED ORDER — FENTANYL CITRATE (PF) 100 MCG/2ML IJ SOLN: 25.0000 ug | INTRAMUSCULAR | Status: DC | PRN

## 2020-09-01 NOTE — Progress Notes (Addendum)
Called for trauma consult.  Elderly woman with history of falls and some gait and dizziness/vertigo issues.  Had episode of falling.  Just did avoid hitting her head and fell on her right chest wall.  Came in with complaints of pain discomfort.  Found to be hypoxic on room air but improved on oxygen.  Multiple right-sided rib fractures.  No obvious pneumothorax chest CT of chest done in tiny apical pneumothorax noted.  Admission recommended.  Placed on nonnarcotic pain control scheduled Tylenol.  Ice/heat.  Have backup Robaxin and narcotics as needed.  Medical admission for multiple medical problems and not high acuity trauma.  Ideally at [facility] where the trauma service can help follow and has resources for evaluation and rehab.  Neurology availability  Consider Physical/Occupational Therapy evaluations and social work evaluations given history of repeated falls to figure out a more safe plan to transition out of the hospital & due diligence to avoid repeated falls -equipment needs, PT visit, need for rehab, etc.  Dr. Bedelia Person, Barrister's clerk, is on-call tonight and his aware of admission.  She will re-evaluate upon transfer.  Ardeth Sportsman, MD, FACS, MASCRS Esophageal, Gastrointestinal & Colorectal Surgery Robotic and Minimally Invasive Surgery  [facility] [address], [address] [place], [place] [address] [phone] Fax [phone] Main/Paging  CONTACT INFORMATION: Weekday (9AM-5PM) concerns: Call [facility] main office at [phone] Weeknight (5PM-9AM) or Weekend/Holiday concerns: Check [email or web address] for General Surgery [facility] coverage (Please, do not use SecureChat as it is not reliable communication to operating surgeons for immediate patient care)

## 2020-09-01 NOTE — ED Notes (Signed)
Paged general surgery

## 2020-09-01 NOTE — H&P (Signed)
History and Physical    Andrea StandardShirley M Kasparek ZOX:[id number] DOB: 08/11/1935 DOA: 08/31/2020  PCP: Wanda PlumpPaz, Jose E, MD Patient coming from: Home  Chief Complaint: Fall  HPI: Andrea Martin is a 85 y.o. female with medical history significant of hypertension, hyperlipidemia, neuropathy, anxiety, depression presented to the ED complaining of right-sided rib pain after a mechanical fall this evening.  Sats dropped to 90% on room air and was placed on 2 L supplemental oxygen.  CT chest showing multiple right-sided rib fractures with associated small right-sided pneumothorax and mild subcutaneous emphysema.  Also showing mild associated atelectasis and effusion on the right. ED physician discussed the case with on-call trauma surgeon who requested admission under medicine service at [facility] and will see the patient.  Medications administered in the ED include diclofenac patch, fentanyl, and Zofran.  Patient states while walking at home her foot got caught in a rug and she tripped and fell.  Hit the right side of her chest and back on a piano.  She did not hit her head.  Since the fall she is having severe pain in her right lateral and posterior ribs and the pain is worse when she takes deep breaths.  States she was in her normal state of health and not sick until this incident today.  She has no other complaints.  Review of Systems:  All systems reviewed and apart from history of presenting illness, are negative.  Past Medical History:  Diagnosis Date   Anxiety and depression    Gait disorder    History of fractures due to falls 09/01/2020   HTN (hypertension) 09/01/2020   Hyperlipidemia    Neuropathy 08/04/2013   Shoulder dislocation    h/o after a fall    Past Surgical History:  Procedure Laterality Date   NO PAST SURGERIES       reports that she has never smoked. She has never used smokeless tobacco. She reports that she does not drink alcohol and does not use drugs.  Allergies  Allergen  Reactions   Codeine Nausea And Vomiting   Sulfa Antibiotics Other (See Comments)    Was told allergic to sulfa does not remember why   Penicillins Rash    Family History  Problem Relation Age of Onset   Heart disease Father        CAD?   Coronary artery disease Brother 50   Heart disease Mother    Breast cancer Neg Hx    Colon cancer Neg Hx    Diabetes Neg Hx    Stroke Neg Hx     Prior to Admission medications   Medication Sig Start Date End Date Taking? Authorizing Provider  acetaminophen (TYLENOL) 500 MG tablet Take 1,000 mg by mouth every 6 (six) hours as needed for headache (pain).   Yes [provider]  ALPRAZolam (XANAX) 0.5 MG tablet TAKE 1/2 - 1 TABLET BY MOUTH 3 TIMES DAILY AS NEEDED FOR ANXIETY Patient taking differently: Take 0.25-0.5 mg by mouth See admin instructions. Take 1/2 - 1 tablet (0.25 - 0.5 mg) by mouth twice daily, may take another 1/2 - 1 tablet (0.25 -0.5 mg) during the day as needed for anxiety 08/06/20 02/02/21 Yes Paz, Nolon RodJose E, MD  azelastine (ASTELIN) 0.1 % nasal spray Place 2 sprays into both nostrils at bedtime as needed for rhinitis. Use in each nostril as directed 12/15/17  Yes Paz, Nolon RodJose E, MD  buPROPion [facility](WELLBUTRIN SR) 150 MG 12 hr tablet Take 2 tablets (300  mg total) by mouth daily. Patient taking differently: Take 300 mg by mouth every morning. 05/06/20  Yes Paz, Nolon Rod, MD  Calcium Carb-Cholecalciferol (CALCIUM 600+D3) 600-800 MG-UNIT TABS Take 1 tablet by mouth every morning.   Yes [provider]  Cholecalciferol (VITAMIN D3) 50 MCG (2000 UT) TABS Take 2,000 Units by mouth every morning.   Yes [provider]  escitalopram (LEXAPRO) 20 MG tablet Take 1 tablet (20 mg total) by mouth daily. Patient taking differently: Take 20 mg by mouth every morning. 05/06/20  Yes Paz, Nolon Rod, MD  GLUCOSAMINE-CHONDROITIN PO Take 1 tablet by mouth every morning. Glucosamine 1500 mg, chondroitin 1200 mg   Yes [provider]   losartan (COZAAR) 50 MG tablet Take 1 tablet (50 mg total) by mouth daily. Patient taking differently: Take 50 mg by mouth every morning. 08/27/20  Yes Paz, Nolon Rod, MD  meclizine (ANTIVERT) 12.5 MG tablet Take 1 tablet (12.5 mg total) by mouth 3 (three) times daily as needed for dizziness. 11/03/19  Yes Saguier, Ramon Dredge, PA-C  Multiple Vitamin (MULTIVITAMIN WITH MINERALS) TABS tablet Take 1 tablet by mouth every morning.   Yes [provider]  Omega-3 Fatty Acids (FISH OIL) 600 MG CAPS Take 600 mg by mouth every morning.   Yes [provider]  pravastatin (PRAVACHOL) 20 MG tablet Take 1 tablet (20 mg total) by mouth at bedtime. 08/27/20  Yes Wanda Plump, MD    Physical Exam: Vitals:   08/31/20 2215 08/31/20 2300 08/31/20 2345 09/01/20 0000  BP: (!) 178/71 (!) 151/100 (!) 188/75 (!) 164/87  Pulse: 68 74 76 71  Resp: 17 17 (!) 21 (!) 22  Temp:      TempSrc:      SpO2: 98% 96% 90% 100%    Physical Exam Constitutional:      General: She is not in acute distress. HENT:     Head: Normocephalic and atraumatic.  Eyes:     Extraocular Movements: Extraocular movements intact.     Conjunctiva/sclera: Conjunctivae normal.  Cardiovascular:     Rate and Rhythm: Normal rate and regular rhythm.     Pulses: Normal pulses.  Pulmonary:     Effort: Pulmonary effort is normal. No respiratory distress.     Breath sounds: No wheezing or rales.     Comments: Equal breath sounds appreciated bilaterally Satting well on 2 L supplemental oxygen Abdominal:     General: Bowel sounds are normal. There is no distension.     Palpations: Abdomen is soft.     Tenderness: There is no abdominal tenderness.  Musculoskeletal:        General: No swelling or tenderness.     Cervical back: Normal range of motion and neck supple.  Skin:    General: Skin is warm and dry.  Neurological:     General: No focal deficit present.     Mental Status: She is alert and oriented to person, place, and time.      Labs on Admission: I have personally reviewed following labs and imaging studies  CBC: Recent Labs  Lab 08/31/20 2200  WBC 11.1*  HGB 12.9  HCT 38.5  MCV 98.0  PLT 213   Basic Metabolic Panel: Recent Labs  Lab 08/31/20 2200  NA 141  K 3.7  CL 109  CO2 29  GLUCOSE 125*  BUN 18  CREATININE 0.94  CALCIUM 9.1   GFR: Estimated Creatinine Clearance: 42.5 mL/min (by C-G formula based on SCr of 0.94 mg/dL).  Liver Function Tests: No results for input(s): AST, ALT, ALKPHOS, BILITOT, PROT, ALBUMIN in the last 168 hours. No results for input(s): LIPASE, AMYLASE in the last 168 hours. No results for input(s): AMMONIA in the last 168 hours. Coagulation Profile: No results for input(s): INR, PROTIME in the last 168 hours. Cardiac Enzymes: No results for input(s): CKTOTAL, CKMB, CKMBINDEX, TROPONINI in the last 168 hours. BNP (last 3 results) No results for input(s): PROBNP in the last 8760 hours. HbA1C: No results for input(s): HGBA1C in the last 72 hours. CBG: No results for input(s): GLUCAP in the last 168 hours. Lipid Profile: No results for input(s): CHOL, HDL, LDLCALC, TRIG, CHOLHDL, LDLDIRECT in the last 72 hours. Thyroid Function Tests: No results for input(s): TSH, T4TOTAL, FREET4, T3FREE, THYROIDAB in the last 72 hours. Anemia Panel: No results for input(s): VITAMINB12, FOLATE, FERRITIN, TIBC, IRON, RETICCTPCT in the last 72 hours. Urine analysis: No results found for: COLORURINE, APPEARANCEUR, LABSPEC, PHURINE, GLUCOSEU, HGBUR, BILIRUBINUR, KETONESUR, PROTEINUR, UROBILINOGEN, NITRITE, LEUKOCYTESUR  Radiological Exams on Admission: DG Ribs Unilateral W/Chest Right  Result Date: 08/31/2020 CLINICAL DATA:  Right-sided rib pain following fall, initial encounter EXAM: RIGHT RIBS AND CHEST - 3+ VIEW COMPARISON:  None. FINDINGS: Multiple right-sided rib fractures are noted to include the third through seventh ribs. The fractures of the sixth and seventh ribs are  significantly displaced. Multifocal fractures in the fifth and sixth ribs are noted posteriorly. No underlying pneumothorax is seen. Some subcutaneous emphysema is noted. Increased basilar opacity is noted on the right likely related to contusion. No other focal abnormality is noted. IMPRESSION: Multiple right-sided rib fractures with underlying contusion. No definitive pneumothorax is seen although some subcutaneous emphysema is noted. Electronically Signed   By: Alcide Clever M.D.   On: 08/31/2020 21:52   CT CHEST WO CONTRAST  Result Date: 08/31/2020 CLINICAL DATA:  Recent fall with multiple right-sided rib fractures, initial encounter EXAM: CT CHEST WITHOUT CONTRAST TECHNIQUE: Multidetector CT imaging of the chest was performed following the standard protocol without IV contrast. COMPARISON:  Rib series obtained earlier in the same day FINDINGS: Cardiovascular: Significantly limited due to lack of IV contrast. Diffuse atherosclerotic calcifications are noted in the aorta. No aneurysmal dilatation is seen. No cardiac enlargement is noted. Mild coronary calcifications are seen. Mediastinum/Nodes: Thoracic inlet is within normal limits. No sizable hilar or mediastinal adenopathy is noted. The esophagus as visualized is within normal limits. Lungs/Pleura: Lungs are well aerated bilaterally. Left lung shows some minimal subpleural fibrotic changes in the lower lobe. Right lung demonstrates mild lower lobe atelectasis and a small effusion. Tiny lateral pneumothorax is seen related to displaced rib fractures. This is better visualized than on prior plain film examination. Associated subcutaneous emphysema is seen. Upper Abdomen: Visualized upper abdomen is unremarkable. Musculoskeletal: Degenerative changes of the thoracic spine are noted. No rib abnormality on the left is seen. Calcified loose bodies are noted in the right shoulder joint. Multiple right-sided rib fractures are noted to include the third, fourth,  fifth, sixth and seventh ribs laterally. Posterior rib fractures are noted on the fourth through ninth ribs on the right. The right fifth, sixth and seventh rib fractures laterally are significantly displaced creating the small pneumothorax on the right. No other rib fractures are seen. IMPRESSION: Multiple right-sided rib fractures as described with associated small right-sided pneumothorax and mild subcutaneous emphysema. Mild associated atelectasis and effusion is noted on the right. No other acute abnormality is noted. Aortic Atherosclerosis (ICD10-I70.0). Electronically Signed   By: Loraine Leriche  Lukens M.D.   On: 08/31/2020 23:53    Assessment/Plan Principal Problem:   Pneumothorax on right Active Problems:   HLD (hyperlipidemia)   Anxiety and depression   Multiple rib fractures   HTN (hypertension)   Small right-sided pneumothorax secondary to multiple right-sided rib fractures Sats dropped to 90% on room air in the ED, pain and splinting likely contributing.  Currently stable on 2 L supplemental oxygen. -Continuous pulse ox, continue supplemental oxygen.  Pain control: Scheduled Tylenol, diclofenac patch, fentanyl as needed, oxycodone IR as needed, Robaxin as needed.  Repeat chest x-ray in AM.  PT/OT eval, fall precautions.  Trauma surgery will consult and requested admission at [facility].  Hypertension Blood pressure elevated and pain likely contributing. -Continue pain management.  Continue home losartan, hydralazine as needed.  Hyperlipidemia -Continue pravastatin  Anxiety and depression -Continue home Xanax, bupropion, and Lexapro.  DVT prophylaxis: SCDs Code Status: Patient wishes to be full code. Family Communication: Daughter at bedside. Disposition Plan: Status is: Inpatient  Remains inpatient appropriate because:Ongoing active pain requiring inpatient pain management and Inpatient level of care appropriate due to severity of illness  Dispo: The patient is from: Home               Anticipated d/c is to: Home              Patient currently is not medically stable to d/c.   Difficult to place patient No  Level of care: Level of care: Telemetry Medical  The medical decision making on this patient was of high complexity and the patient is at high risk for clinical deterioration, therefore this is a level 3 visit.  John Giovanni MD [facility]  If 7PM-7AM, please contact night-coverage [email or web address]  09/01/2020, 12:59 AM

## 2020-09-01 NOTE — ED Notes (Signed)
Pt took off cardiac monitoring leads b/c she states she "couldn't stand it". RN notified.

## 2020-09-01 NOTE — Consult Note (Signed)
Andrea Martin May 13, 1935  [id number].    Chief Complaint/Reason for Consult: trauma  HPI:  Andrea Martin is an 85 yo female who presented to the ED last night after a fall at home. She is the primary caretaker for her husband, who has dementia, and reports multiple falls over the last few weeks while caring for him. Last night she tripped on a rug and fell, and hit her right chest on a piano. She did not hit her head and denies loss of consciousness. Other than right chest wall pain she has no other acute complaints. She denies dizziness and lightheadedness before her fall last night, but said she previously had some dizziness that resolved after her PCP started her on a medication for those symptoms.  In the ED a CXR showed multiple right-sided rib fractures. A chest CT confirmed the rib fractures and also showed a small right pneumothorax. She had a follow up CXR this morning on which the pneumothorax was not visible.  ROS: Review of Systems  Constitutional:  Negative for fever.  Respiratory:  Negative for shortness of breath and stridor.   Cardiovascular:  Positive for chest pain. Negative for leg swelling.  Gastrointestinal:  Negative for abdominal pain.  Musculoskeletal:  Positive for falls. Negative for back pain and neck pain.  Neurological:  Negative for dizziness and weakness.   Family History  Problem Relation Age of Onset   Heart disease Father        CAD?   Coronary artery disease Brother 50   Heart disease Mother    Breast cancer Neg Hx    Colon cancer Neg Hx    Diabetes Neg Hx    Stroke Neg Hx     Past Medical History:  Diagnosis Date   Anxiety and depression    Gait disorder    History of fractures due to falls 09/01/2020   HTN (hypertension) 09/01/2020   Hyperlipidemia    Neuropathy 08/04/2013   Shoulder dislocation    h/o after a fall    Past Surgical History:  Procedure Laterality Date   NO PAST SURGERIES      Social History:  reports that she has  never smoked. She has never used smokeless tobacco. She reports that she does not drink alcohol and does not use drugs.  Allergies:  Allergies  Allergen Reactions   Codeine Nausea And Vomiting   Sulfa Antibiotics Other (See Comments)    Was told allergic to sulfa does not remember why   Penicillins Rash    (Not in a hospital admission)    Physical Exam: Blood pressure (!) 147/60, pulse 60, temperature 98.4 F (36.9 C), temperature source Oral, resp. rate 11, SpO2 98 %. General: resting comfortably, appears stated age, no apparent distress Neurological: alert and oriented, no focal deficits, cranial nerves grossly in tact, GCS 15. No cervical spinal tenderness to palpation. HEENT: normocephalic, atraumatic CV: regular rate and rhythm, no murmurs, extremities warm and well-perfused with no edema Respiratory: normal work of breathing on nasal cannula, lungs clear to auscultation bilaterally, symmetric chest wall expansion Abdomen: soft, nondistended, nontender to deep palpation. No masses or organomegaly. Extremities: warm and well-perfused, no deformities or abrasions, moving all extremities spontaneously Psychiatric: normal mood and affect Skin: warm and dry, no jaundice, no rashes or lesions   Results for orders placed or performed during the hospital encounter of 08/31/20 (from the past 48 hour(s))  Basic metabolic panel     Status: Abnormal   Collection  Time: 08/31/20 10:00 PM  Result Value Ref Range   Sodium 141 135 - 145 mmol/L   Potassium 3.7 3.5 - 5.1 mmol/L   Chloride 109 98 - 111 mmol/L   CO2 29 22 - 32 mmol/L   Glucose, Bld 125 (H) 70 - 99 mg/dL    Comment: Glucose reference range applies only to samples taken after fasting for at least 8 hours.   BUN 18 8 - 23 mg/dL   Creatinine, Ser 0.34 0.44 - 1.00 mg/dL   Calcium 9.1 8.9 - 74.2 mg/dL   GFR, Estimated >59 >56 mL/min    Comment: (NOTE) Calculated using the CKD-EPI Creatinine Equation (2021)    Anion gap 3 (L)  5 - 15    Comment: Performed at [facility], [address], [place], [place] [address]  CBC     Status: Abnormal   Collection Time: 08/31/20 10:00 PM  Result Value Ref Range   WBC 11.1 (H) 4.0 - 10.5 K/uL   RBC 3.93 3.87 - 5.11 MIL/uL   Hemoglobin 12.9 12.0 - 15.0 g/dL   HCT 43.3 29.5 - 18.8 %   MCV 98.0 80.0 - 100.0 fL   MCH 32.8 26.0 - 34.0 pg   MCHC 33.5 30.0 - 36.0 g/dL   RDW 41.6 60.6 - 30.1 %   Platelets 213 150 - 400 K/uL   nRBC 0.0 0.0 - 0.2 %    Comment: Performed at [facility], [address], [place], [place] [address]  SARS CORONAVIRUS 2 (TAT 6-24 HRS) Nasopharyngeal Nasopharyngeal Swab     Status: None   Collection Time: 08/31/20 10:00 PM   Specimen: Nasopharyngeal Swab  Result Value Ref Range   SARS Coronavirus 2 NEGATIVE NEGATIVE    Comment: (NOTE) SARS-CoV-2 target nucleic acids are NOT DETECTED.  The SARS-CoV-2 RNA is generally detectable in upper and lower respiratory specimens during the acute phase of infection. Negative results do not preclude SARS-CoV-2 infection, do not rule out co-infections with other pathogens, and should not be used as the sole basis for treatment or other patient management decisions. Negative results must be combined with clinical observations, patient history, and epidemiological information. The expected result is Negative.  Fact Sheet for Patients: HairSlick.no  Fact Sheet for Healthcare Providers: quierodirigir.com  This test is not yet approved or cleared by the [place] FDA and  has been authorized for detection and/or diagnosis of SARS-CoV-2 by FDA under an Emergency Use Authorization (EUA). This EUA will remain  in effect (meaning this test can be used) for the duration of the COVID-19 declaration under Se ction 564(b)(1) of the Act, 21 U.S.C. section 360bbb-3(b)(1), unless the authorization is terminated or revoked  sooner.  Performed at [facility], [address], [place], [place] [address]   CBC     Status: Abnormal   Collection Time: 09/01/20  5:39 AM  Result Value Ref Range   WBC 6.8 4.0 - 10.5 K/uL   RBC 3.72 (L) 3.87 - 5.11 MIL/uL   Hemoglobin 12.0 12.0 - 15.0 g/dL   HCT 73.2 20.2 - 54.2 %   MCV 97.8 80.0 - 100.0 fL   MCH 32.3 26.0 - 34.0 pg   MCHC 33.0 30.0 - 36.0 g/dL   RDW 70.6 23.7 - 62.8 %   Platelets 115 (L) 150 - 400 K/uL    Comment: SPECIMEN CHECKED FOR CLOTS Immature Platelet Fraction may be clinically indicated, consider ordering this additional test [id number]  nRBC 0.0 0.0 - 0.2 %    Comment: Performed at [facility], [address] [place], [place] [address]   DG Chest 2 View  Result Date: 09/01/2020 CLINICAL DATA:  Multiple RIGHT-sided rib fractures.  Pneumothorax. EXAM: CHEST - 2 VIEW COMPARISON:  Chest x-ray and chest CT dated 08/31/2020 FINDINGS: Heart size and mediastinal contours are stable. Stable mild atelectasis and/or small pleural effusion at the RIGHT lung base. Additional probable atelectasis at the LEFT lung base. No pneumothorax is seen. The multiple displaced RIGHT-sided rib fractures appear grossly stable in alignment. No new osseous abnormality seen. IMPRESSION: No significant change. Multiple displaced RIGHT-sided rib fractures. Stable mild atelectasis and/or small pleural effusion at the RIGHT lung base. No pneumothorax seen. Electronically Signed   By: Bary Richard M.D.   On: 09/01/2020 06:39   DG Ribs Unilateral W/Chest Right  Result Date: 08/31/2020 CLINICAL DATA:  Right-sided rib pain following fall, initial encounter EXAM: RIGHT RIBS AND CHEST - 3+ VIEW COMPARISON:  None. FINDINGS: Multiple right-sided rib fractures are noted to include the third through seventh ribs. The fractures of the sixth and seventh ribs are significantly displaced. Multifocal fractures in the fifth and sixth ribs are noted posteriorly. No underlying  pneumothorax is seen. Some subcutaneous emphysema is noted. Increased basilar opacity is noted on the right likely related to contusion. No other focal abnormality is noted. IMPRESSION: Multiple right-sided rib fractures with underlying contusion. No definitive pneumothorax is seen although some subcutaneous emphysema is noted. Electronically Signed   By: Alcide Clever M.D.   On: 08/31/2020 21:52   CT CHEST WO CONTRAST  Result Date: 08/31/2020 CLINICAL DATA:  Recent fall with multiple right-sided rib fractures, initial encounter EXAM: CT CHEST WITHOUT CONTRAST TECHNIQUE: Multidetector CT imaging of the chest was performed following the standard protocol without IV contrast. COMPARISON:  Rib series obtained earlier in the same day FINDINGS: Cardiovascular: Significantly limited due to lack of IV contrast. Diffuse atherosclerotic calcifications are noted in the aorta. No aneurysmal dilatation is seen. No cardiac enlargement is noted. Mild coronary calcifications are seen. Mediastinum/Nodes: Thoracic inlet is within normal limits. No sizable hilar or mediastinal adenopathy is noted. The esophagus as visualized is within normal limits. Lungs/Pleura: Lungs are well aerated bilaterally. Left lung shows some minimal subpleural fibrotic changes in the lower lobe. Right lung demonstrates mild lower lobe atelectasis and a small effusion. Tiny lateral pneumothorax is seen related to displaced rib fractures. This is better visualized than on prior plain film examination. Associated subcutaneous emphysema is seen. Upper Abdomen: Visualized upper abdomen is unremarkable. Musculoskeletal: Degenerative changes of the thoracic spine are noted. No rib abnormality on the left is seen. Calcified loose bodies are noted in the right shoulder joint. Multiple right-sided rib fractures are noted to include the third, fourth, fifth, sixth and seventh ribs laterally. Posterior rib fractures are noted on the fourth through ninth ribs on the  right. The right fifth, sixth and seventh rib fractures laterally are significantly displaced creating the small pneumothorax on the right. No other rib fractures are seen. IMPRESSION: Multiple right-sided rib fractures as described with associated small right-sided pneumothorax and mild subcutaneous emphysema. Mild associated atelectasis and effusion is noted on the right. No other acute abnormality is noted. Aortic Atherosclerosis (ICD10-I70.0). Electronically Signed   By: Alcide Clever M.D.   On: 08/31/2020 23:53      Assessment/Plan 85 yo female s/p fall from standing at home with multiple displaced right rib fractures and trace right pneumothorax. -  Patient is at high risk for respiratory decompensation due to her age with multiple displaced rib fractures. She will need aggressive pulmonary toilet with frequent IS and good pain control. - Pain control: continue scheduled multimodals (tylenol and robaxin, can also add gabapentin if needed) and prn oxycodone for more severe pain - Trace pneumothorax, stable on CXR this morning, no indication for chest tube at this time - Prefer admission at [facility], trauma service will follow while inpatient   Sophronia SimasShelby Hazen Brumett, MD [facility], Hepatobiliary and Pancreatic Surgery 09/01/20 9:01 AM

## 2020-09-01 NOTE — ED Notes (Signed)
Pt given meal tray.

## 2020-09-01 NOTE — Plan of Care (Signed)
Patient seen in ER this am. Admitted after MN, see formal H&P for full plan.  She remains on 2L [place] but sats >98%. Has not required chest tube placement in setting of right PNX from fall at home, now with R right fractures (4th - 9th and the 5th-7th ribs are displaced creating the PNX on the right).  Trauma surgery is following. Tentative plan is for transfer to [facility] for ongoing care/management.  Repeat CXR this am, 7/10 showed improvement in PNX.   Continue pain control, O2 as needed, and encouraging IS use.  Planning for transfer to [facility] still.   Lewie Chamber, MD [facility] 09/01/2020, 1:25 PM

## 2020-09-01 NOTE — Progress Notes (Addendum)
Patient arrived to [place], she is seen and examined, she appears comfortable, vital signs are stable, daughter at bedside . Encourage incentive spirometer, discussed with RN .

## 2020-09-02 ENCOUNTER — Encounter: Payer: Medicare Other | Admitting: Internal Medicine

## 2020-09-02 DIAGNOSIS — S2241XD Multiple fractures of ribs, right side, subsequent encounter for fracture with routine healing: Secondary | ICD-10-CM

## 2020-09-02 DIAGNOSIS — J939 Pneumothorax, unspecified: Secondary | ICD-10-CM | POA: Diagnosis not present

## 2020-09-02 NOTE — Progress Notes (Addendum)
PROGRESS NOTE    Andrea Martin   ZOX:[id number]  DOB: 1935/06/09  PCP: Wanda Plump, MD    DOA: 08/31/2020 LOS: 1   Assessment & Plan   Principal Problem:   Pneumothorax on right Active Problems:   HLD (hyperlipidemia)   Anxiety and depression   Multiple rib fractures   HTN (hypertension)   Multiple right-sided rib fractures due to mechanical fall -  Small right pneumothorax due to above Pneumothorax appears resolved on repeat chest x-ray this morning (7/11) --Trauma surgery following --Has not required chest tube --Continue multimodal pain control per orders: Tylenol, Voltaren patch as needed Robaxin, oxycodone --Frequent incentive spirometry --Mobilize as tolerated --Supplement oxygen if needed to maintain sat above 90% --PT and OT --Fall precautions  Thombocytopenia - Plts 213k >> 115k on CBC.   --Follow CBC's --Avoid heparin/Lovenox  Essential hypertension -elevated likely due to pain. --Continue losartan, hydralazine as needed --Control pain  Hyperlipidemia -continue pravastatin  Anxiety and depression -continue home Lexapro, Wellbutrin and Xanax    Patient BMI: Body mass index is 27.15 kg/m.   DVT prophylaxis: SCDs Start: 09/01/20 0058   Diet:  Diet Orders (From admission, onward)     Start     Ordered   09/01/20 0126  Diet Heart Room service appropriate? Yes; Fluid consistency: Thin  Diet effective now       Question Answer Comment  Room service appropriate? Yes   Fluid consistency: Thin      09/01/20 0125              Code Status: Full Code   Brief Narrative / Hospital Course to Date:   85 y.o. female with medical history significant of hypertension, hyperlipidemia, neuropathy, anxiety, depression presented to the ED complaining of right-sided rib pain after a mechanical fall at home when she caught her foot on a rug.  She was found to have multiple right-sided rib fractures and a small pneumothorax with mild associated  hypoxia.  Subjective 09/02/20    Pt up in recliner this AM.  Reports "right much pain" with R ribs, worse with deep breathing.  She appears drowsy and nods off easily.  No other acute complaints.    Disposition Plan & Communication   Status is: Inpatient  Remains inpatient appropriate because:Inpatient level of care appropriate due to severity of illness  Dispo: The patient is from: Home              Anticipated d/c is to: Home              Patient currently is not medically stable to d/c.   Difficult to place patient No   Family Communication: None at bedside on rounds today, will attempt to call   Consults, Procedures, Significant Events   Consultants:  Trauma surgery  Procedures:  None  Antimicrobials:  Anti-infectives (From admission, onward)    None         Micro    Objective   Vitals:   09/01/20 1600 09/01/20 1700 09/01/20 2031 09/02/20 0344  BP: (!) 148/73  (!) 136/50 (!) 128/47  Pulse: 64 61 (!) 52 71  Resp: 18 18 16 16   Temp:  98.6 F (37 C) 97.7 F (36.5 C) 97.7 F (36.5 C)  TempSrc:  Oral  Oral  SpO2: 97% 99% 99% 99%  Weight:  74 kg    Height:  5\' 5"  (1.651 m)     No intake or output data in the 24 hours ending 09/02/20  7169 Filed Weights   09/01/20 1240 09/01/20 1700  Weight: 70.8 kg 74 kg    Physical Exam:  General exam: awake but drowsy appearing, no acute distress HEENT: atraumatic, clear conjunctiva, anicteric sclera, moist mucus membranes, hearing grossly normal  Respiratory system: CTAB but diminished due to shallow inspirations, no wheezes, rales or rhonchi, normal respiratory effort. Cardiovascular system: normal S1/S2, RRR, no pedal edema.   Gastrointestinal system: soft, NT, ND Central nervous system: no gross focal neurologic deficits, normal speech Extremities: moves all, no edema, normal tone   Labs   Data Reviewed: I have personally reviewed following labs and imaging studies  CBC: Recent Labs  Lab  08/31/20 2200 09/01/20 0539  WBC 11.1* 6.8  HGB 12.9 12.0  HCT 38.5 36.4  MCV 98.0 97.8  PLT 213 115*   Basic Metabolic Panel: Recent Labs  Lab 08/31/20 2200  NA 141  K 3.7  CL 109  CO2 29  GLUCOSE 125*  BUN 18  CREATININE 0.94  CALCIUM 9.1   GFR: Estimated Creatinine Clearance: 45.7 mL/min (by C-G formula based on SCr of 0.94 mg/dL). Liver Function Tests: No results for input(s): AST, ALT, ALKPHOS, BILITOT, PROT, ALBUMIN in the last 168 hours. No results for input(s): LIPASE, AMYLASE in the last 168 hours. No results for input(s): AMMONIA in the last 168 hours. Coagulation Profile: No results for input(s): INR, PROTIME in the last 168 hours. Cardiac Enzymes: No results for input(s): CKTOTAL, CKMB, CKMBINDEX, TROPONINI in the last 168 hours. BNP (last 3 results) No results for input(s): PROBNP in the last 8760 hours. HbA1C: No results for input(s): HGBA1C in the last 72 hours. CBG: No results for input(s): GLUCAP in the last 168 hours. Lipid Profile: No results for input(s): CHOL, HDL, LDLCALC, TRIG, CHOLHDL, LDLDIRECT in the last 72 hours. Thyroid Function Tests: No results for input(s): TSH, T4TOTAL, FREET4, T3FREE, THYROIDAB in the last 72 hours. Anemia Panel: No results for input(s): VITAMINB12, FOLATE, FERRITIN, TIBC, IRON, RETICCTPCT in the last 72 hours. Sepsis Labs: No results for input(s): PROCALCITON, LATICACIDVEN in the last 168 hours.  Recent Results (from the past 240 hour(s))  SARS CORONAVIRUS 2 (TAT 6-24 HRS) Nasopharyngeal Nasopharyngeal Swab     Status: None   Collection Time: 08/31/20 10:00 PM   Specimen: Nasopharyngeal Swab  Result Value Ref Range Status   SARS Coronavirus 2 NEGATIVE NEGATIVE Final    Comment: (NOTE) SARS-CoV-2 target nucleic acids are NOT DETECTED.  The SARS-CoV-2 RNA is generally detectable in upper and lower respiratory specimens during the acute phase of infection. Negative results do not preclude SARS-CoV-2 infection,  do not rule out co-infections with other pathogens, and should not be used as the sole basis for treatment or other patient management decisions. Negative results must be combined with clinical observations, patient history, and epidemiological information. The expected result is Negative.  Fact Sheet for Patients: HairSlick.no  Fact Sheet for Healthcare Providers: quierodirigir.com  This test is not yet approved or cleared by the [place] FDA and  has been authorized for detection and/or diagnosis of SARS-CoV-2 by FDA under an Emergency Use Authorization (EUA). This EUA will remain  in effect (meaning this test can be used) for the duration of the COVID-19 declaration under Se ction 564(b)(1) of the Act, 21 U.S.C. section 360bbb-3(b)(1), unless the authorization is terminated or revoked sooner.  Performed at [facility], [address], [place], [place] [address]       Imaging Studies   DG Chest  2 View  Result Date: 09/01/2020 CLINICAL DATA:  Multiple RIGHT-sided rib fractures.  Pneumothorax. EXAM: CHEST - 2 VIEW COMPARISON:  Chest x-ray and chest CT dated 08/31/2020 FINDINGS: Heart size and mediastinal contours are stable. Stable mild atelectasis and/or small pleural effusion at the RIGHT lung base. Additional probable atelectasis at the LEFT lung base. No pneumothorax is seen. The multiple displaced RIGHT-sided rib fractures appear grossly stable in alignment. No new osseous abnormality seen. IMPRESSION: No significant change. Multiple displaced RIGHT-sided rib fractures. Stable mild atelectasis and/or small pleural effusion at the RIGHT lung base. No pneumothorax seen. Electronically Signed   By: Bary RichardStan  Maynard M.D.   On: 09/01/2020 06:39   DG Ribs Unilateral W/Chest Right  Result Date: 08/31/2020 CLINICAL DATA:  Right-sided rib pain following fall, initial encounter EXAM: RIGHT RIBS AND CHEST - 3+ VIEW COMPARISON:   None. FINDINGS: Multiple right-sided rib fractures are noted to include the third through seventh ribs. The fractures of the sixth and seventh ribs are significantly displaced. Multifocal fractures in the fifth and sixth ribs are noted posteriorly. No underlying pneumothorax is seen. Some subcutaneous emphysema is noted. Increased basilar opacity is noted on the right likely related to contusion. No other focal abnormality is noted. IMPRESSION: Multiple right-sided rib fractures with underlying contusion. No definitive pneumothorax is seen although some subcutaneous emphysema is noted. Electronically Signed   By: Alcide CleverMark  Lukens M.D.   On: 08/31/2020 21:52   CT CHEST WO CONTRAST  Result Date: 08/31/2020 CLINICAL DATA:  Recent fall with multiple right-sided rib fractures, initial encounter EXAM: CT CHEST WITHOUT CONTRAST TECHNIQUE: Multidetector CT imaging of the chest was performed following the standard protocol without IV contrast. COMPARISON:  Rib series obtained earlier in the same day FINDINGS: Cardiovascular: Significantly limited due to lack of IV contrast. Diffuse atherosclerotic calcifications are noted in the aorta. No aneurysmal dilatation is seen. No cardiac enlargement is noted. Mild coronary calcifications are seen. Mediastinum/Nodes: Thoracic inlet is within normal limits. No sizable hilar or mediastinal adenopathy is noted. The esophagus as visualized is within normal limits. Lungs/Pleura: Lungs are well aerated bilaterally. Left lung shows some minimal subpleural fibrotic changes in the lower lobe. Right lung demonstrates mild lower lobe atelectasis and a small effusion. Tiny lateral pneumothorax is seen related to displaced rib fractures. This is better visualized than on prior plain film examination. Associated subcutaneous emphysema is seen. Upper Abdomen: Visualized upper abdomen is unremarkable. Musculoskeletal: Degenerative changes of the thoracic spine are noted. No rib abnormality on the  left is seen. Calcified loose bodies are noted in the right shoulder joint. Multiple right-sided rib fractures are noted to include the third, fourth, fifth, sixth and seventh ribs laterally. Posterior rib fractures are noted on the fourth through ninth ribs on the right. The right fifth, sixth and seventh rib fractures laterally are significantly displaced creating the small pneumothorax on the right. No other rib fractures are seen. IMPRESSION: Multiple right-sided rib fractures as described with associated small right-sided pneumothorax and mild subcutaneous emphysema. Mild associated atelectasis and effusion is noted on the right. No other acute abnormality is noted. Aortic Atherosclerosis (ICD10-I70.0). Electronically Signed   By: Alcide CleverMark  Lukens M.D.   On: 08/31/2020 23:53     Medications   Scheduled Meds:  acetaminophen  1,000 mg Oral Q6H   ALPRAZolam  0.25-0.5 mg Oral BID   buPROPion  300 mg Oral q morning   diclofenac  1 patch Transdermal BID   escitalopram  20 mg Oral q morning  lip balm  1 application Topical BID   losartan  50 mg Oral q morning   multivitamin with minerals  1 tablet Oral q morning   pravastatin  20 mg Oral QHS   Continuous Infusions:  albumin human     methocarbamol (ROBAXIN) IV     ondansetron (ZOFRAN) IV         LOS: 1 day    Time spent: 30 minutes     Pennie Banter, DO [facility]  09/02/2020, 7:54 AM      If 7PM-7AM, please contact night-coverage. How to contact the [facility] Attending or Consulting provider 7A - 7P or covering provider during after hours 7P -7A, for this patient?    Check the care team in [facility] and look for a) attending/consulting [facility] provider listed and b) the [facility] team listed Log into [email or web address] and use [facility] universal password to access. If you do not have the password, please contact the hospital operator. Locate the [facility] provider you are looking for under [facility] and page to a number that you can be  directly reached. If you still have difficulty reaching the provider, please page the [facility] (Director on Call) for the Hospitalists listed on amion for assistance.

## 2020-09-02 NOTE — Evaluation (Signed)
Physical Therapy Evaluation Patient Details Name: EMMANUEL ERCOLE MRN: [id number] DOB: [birth year] Today's Date: 09/02/2020   History of Present Illness  85 y.o. female presenting to ED 7/9 s/p mechanical fall with R-sided rib pain. Patient hypoxic in ED. CT chest (+) multiple R-sided rib fractures with associated small R-sided pneumothorax and mild subcutaneous emphysema. Imaging also (+) for mild atelectasis and effusion on R. PMHx significant for recurent falls with injury, HTN, HLD, neuropathy and anxiety/depression.  Clinical Impression  Pt displayed global weakness when performing sit to stand and gait tasks. Complained of R shoulder pain in weight bearing frequently compared to R flank (messaged MD). Pt demonstrated difficulty with multitasking (I.e., breathing, steering RW, and ambulating simultaneously), and required verbal and tactile cueing to ambulate and navigate RW. Reported feeling dizzy in standing after ambulating approximately 25 ft in room and became unsteady on feet, spO2 decreased to 86% on room air, but recovered with standing rest break. Pt demonstrated decreased responsiveness and awareness which may be due to medications. Improved understanding with one-step commands. Recommend continuation of PT to improve strength, gait, and function.    Follow Up Recommendations Home health PT;Other (comment) (If pt does not have 24hr care, will need short-term SNF)    Equipment Recommendations     No equipment needed  Recommendations for Other Services       Precautions / Restrictions Precautions Precautions: Fall Restrictions Weight Bearing Restrictions: No      Mobility  Bed Mobility Overal bed mobility: Needs Assistance Bed Mobility: Rolling;Sidelying to Sit Rolling: Supervision Sidelying to sit: Min assist       General bed mobility comments: Able to roll in supine with supervision A and cues for technique. Sidelying to sit with Min A to elevate trunk secondary to  increased pain in R flank.    Transfers Overall transfer level: Needs assistance Equipment used: Rolling walker (2 wheeled) Transfers: Sit to/from Stand Sit to Stand: Min assist         General transfer comment: Pt in recliner prior to start of session. Resting vitals - HR 68 spO2 91. Used bilateral UEs to perform sit to stand. Verbal and tactile cues provided to educate pt on hand placement to stand.  Ambulation/Gait Ambulation/Gait assistance: Min assist Gait Distance (Feet): 50 Feet Assistive device: Rolling walker (2 wheeled) Gait Pattern/deviations: Leaning posteriorly;Step-to pattern   Gait velocity interpretation: <1.8 ft/sec, indicate of risk for recurrent falls General Gait Details: Pt maintained cervical flexion to view feet. Verbal cueing provided to lift head up. Pt demonstrated difficulty following/responding to verbal and tactile cues. Pt mentioned it was difficult to multitask (i.e., breathe, walk, steer) simultaneously. SpO2 decreased to 86 on room air when ambulating. Returned to spO2 91 when taking a standing rest. Pt noted feeling dizzy and became unsteady on feet during standing rest. Required to guide RW when pivoting or navigating obstacles.  Stairs            Wheelchair Mobility    Modified Rankin (Stroke Patients Only)       Balance Overall balance assessment: Needs assistance Sitting-balance support: Single extremity supported;No upper extremity supported;Feet supported Sitting balance-Leahy Scale: Fair     Standing balance support: Single extremity supported;Bilateral upper extremity supported;During functional activity Standing balance-Leahy Scale: Poor Standing balance comment: Pt used UE and RW to maintain standing and dynamic balance. Pt watches feet to ensure they are correctly placed.  Pertinent Vitals/Pain Pain Assessment: Faces Faces Pain Scale: Hurts even more Pain Location: R shoulder pain >  rib Pain Descriptors / Indicators: Sharp Pain Intervention(s): Limited activity within patient's tolerance;Repositioned;Premedicated before session;Monitored during session    Home Living Family/patient expects to be discharged to:: Private residence Living Arrangements: Children;Spouse/significant other Available Help at Discharge: Family Type of Home: House Home Access: Stairs to enter Entrance Stairs-Rails: Right;Left;Can reach both Entrance Stairs-Number of Steps: 5 Home Layout: Two level;Able to live on main level with bedroom/bathroom Home Equipment: Dan Humphreys - 2 wheels;Cane - quad;Bedside commode;Wheelchair - manual      Prior Function Level of Independence: Independent         Comments: Independent with ADLs/IADLs without AD; drives; caregiver for her husband     Hand Dominance   Dominant Hand: Right    Extremity/Trunk Assessment   Upper Extremity Assessment Upper Extremity Assessment: Generalized weakness    Lower Extremity Assessment Lower Extremity Assessment: Defer to PT evaluation    Cervical / Trunk Assessment Cervical / Trunk Assessment: Kyphotic  Communication   Communication: No difficulties  Cognition Arousal/Alertness: Lethargic;Suspect due to medications Behavior During Therapy: [facility] for tasks assessed/performed Overall Cognitive Status: No family/caregiver present to determine baseline cognitive functioning                                 General Comments: Pt has improved response to one-step commands. Reports being lethargic during session.      General Comments General comments (skin integrity, edema, etc.): Patient on RA upon entry. SpO2 >90% at rest nd 89% with activity. RN notified.    Exercises    Assessment/Plan    PT Assessment Patient needs continued PT services  PT Problem List Decreased strength;Decreased range of motion;Decreased balance;Pain;Decreased mobility;Decreased activity tolerance;Decreased cognition        PT Treatment Interventions Functional mobility training;Balance training;Gait training    PT Goals (Current goals can be found in the Care Plan section)  Acute Rehab PT Goals Patient Stated Goal: To return home.    Frequency Min 3X/week   Barriers to discharge Decreased caregiver support Pt is the primary caregiver for spouse. States she does have family support; however, this is uncertain.    Co-evaluation               AM-PAC PT "6 Clicks" Mobility  Outcome Measure Help needed turning from your back to your side while in a flat bed without using bedrails?: A Little Help needed moving from lying on your back to sitting on the side of a flat bed without using bedrails?: A Little Help needed moving to and from a bed to a chair (including a wheelchair)?: A Little Help needed standing up from a chair using your arms (e.g., wheelchair or bedside chair)?: A Little Help needed to walk in hospital room?: A Little Help needed climbing 3-5 steps with a railing? : A Lot 6 Click Score: 17    End of Session Equipment Utilized During Treatment: Gait belt Activity Tolerance: Patient limited by lethargy Patient left: in chair;with call bell/phone within reach;with chair alarm set Nurse Communication: Mobility status PT Visit Diagnosis: Unsteadiness on feet (R26.81);Other abnormalities of gait and mobility (R26.89);Repeated falls (R29.6);Muscle weakness (generalized) (M62.81);History of falling (Z91.81);Pain Pain - Right/Left: Right    Time: 1201-1227 PT Time Calculation (min) (ACUTE ONLY): 26 min   Charges:   PT Evaluation $PT Eval Moderate Complexity: 1 Mod PT Treatments $Gait  Training: 8-22 mins        Velda Shell, [place] Acute Rehab: [phone]   Vance Gather 09/02/2020, 1:13 PM

## 2020-09-02 NOTE — Plan of Care (Signed)

## 2020-09-02 NOTE — TOC Initial Note (Signed)
Transition of Care [facility]) - Initial/Assessment Note    Patient Details  Name: Andrea Martin MRN: [id number] Date of Birth: 1935/03/12  Transition of Care [facility]) CM/SW Contact:    Andrea Frederick, LCSW Phone Number: 09/02/2020, 2:32 PM  Clinical Narrative: CSW spoke with pt regarding recommendation for [facility].  Pt responsive but confused, talked about living with her mother.  Pt did correctly identify her PCP, reported she is vaccinated for covid (documented in epic)  CSW spoke with pt daughter Andrea Martin, who reports that pt husband is Andrea Martin, pt and husband live with Andrea Martin and her two sons (both young adults).  Andrea Martin is home full time in the summer, able to provide 24/7 support at discharge.  Andrea Martin agreeable to referral for Andrea Martin, reports that pt has rollator in the home currently.  Andrea Martin also asked about assistance with healthcare POA.  CSW placed choice document for Andrea Martin and Advance Directive booklet in room, informed Andrea Martin that CSW will reach out to chaplain office for assistance with POA.    CSW spoke with Chaplain office and they will forward referral to chaiplain on duty, will reach out to daugher.                      Expected Discharge Plan: Home w Home Health Services Barriers to Discharge: Continued Medical Work up, Other (must enter comment) (HH with [facility] Medicare payer)   Patient Goals and CMS Choice Patient states their goals for this hospitalization and ongoing recovery are:: "get well" CMS Medicare.gov Compare Post Acute Care list provided to:: Patient Represenative (must comment) Choice offered to / list presented to : Adult Children  Expected Discharge Plan and Services Expected Discharge Plan: Home w Home Health Services     Post Acute Care Choice: Home Health Living arrangements for the past 2 months: Single Family Home                                      Prior Living Arrangements/Services Living arrangements for the past 2 months: Single Family Home Lives with::  Adult Children, Spouse, Relatives (2 grandsons) Patient language and need for interpreter reviewed:: Yes        Need for Family Participation in Patient Care: Yes (Comment) Care giver support system in place?: Yes (comment) Current home services: Other (comment) (none) Criminal Activity/Legal Involvement Pertinent to Current Situation/Hospitalization: No - Comment as needed  Activities of Daily Living Home Assistive Devices/Equipment: None ADL Screening (condition at time of admission) Patient's cognitive ability adequate to safely complete daily activities?: Yes Is the patient deaf or have difficulty hearing?: Yes Does the patient have difficulty seeing, even when wearing glasses/contacts?: Yes Does the patient have difficulty concentrating, remembering, or making decisions?: No Patient able to express need for assistance with ADLs?: Yes Does the patient have difficulty dressing or bathing?: No Independently performs ADLs?: Yes (appropriate for developmental age) Does the patient have difficulty walking or climbing stairs?: Yes Weakness of Legs: None Weakness of Arms/Hands: None  Permission Sought/Granted                  Emotional Assessment Appearance:: Appears stated age Attitude/Demeanor/Rapport: Engaged Affect (typically observed): Appropriate, Pleasant Orientation: : Oriented to Self Alcohol / Substance Use: Not Applicable Psych Involvement: No (comment)  Admission diagnosis:  Rib fractures [S22.49XA] Fall at home [W19.XXXA, Y92.009] Pneumothorax [J93.9] Fall, initial encounter [W19.XXXA] Closed fracture of multiple  ribs of right side, initial encounter [S22.41XA] Patient Active Problem List   Diagnosis Date Noted   Fall at home 09/01/2020   Multiple rib fractures 09/01/2020   Pneumothorax on right 09/01/2020   History of fractures due to falls 09/01/2020   HTN (hypertension) 09/01/2020   Pneumothorax 09/01/2020   History of infarction of left cerebellum  09/01/2020   Remote history of stroke 12/21/2019   Tremor 08/11/2017   Essential tremor 11/29/2015   PCP NOTES >>>>>>>>>>>>>>>>>>>>>>>> 06/04/2015   Neuropathy 08/04/2013   Annual physical exam 08/01/2012   HLD (hyperlipidemia) 11/03/2011   Anxiety and depression 11/03/2011   Gait disorder 11/03/2011   PCP:  Wanda Plump, MD Pharmacy:   [facility] [address], [address] [place] [address] Phone: [phone] Fax: [phone]  [facility]  [facility]) - [place] - [address] [place] [address] Phone: [phone] Fax: [phone]     Social Determinants of Health (SDOH) Interventions    Readmission Risk Interventions No flowsheet data found.

## 2020-09-02 NOTE — Plan of Care (Signed)
  Problem: Health Behavior/Discharge Planning: Goal: Ability to manage health-related needs will improve Outcome: Progressing   Problem: Clinical Measurements: Goal: Ability to maintain clinical measurements within normal limits will improve Outcome: Progressing Goal: Will remain free from infection Outcome: Progressing Goal: Diagnostic test results will improve Outcome: Progressing Goal: Respiratory complications will improve Outcome: Progressing Goal: Cardiovascular complication will be avoided Outcome: Progressing   Problem: Activity: Goal: Risk for activity intolerance will decrease Outcome: Progressing   Problem: Nutrition: Goal: Adequate nutrition will be maintained Outcome: Progressing   Problem: Coping: Goal: Level of anxiety will decrease Outcome: Progressing   Problem: Elimination: Goal: Will not experience complications related to bowel motility Outcome: Progressing Goal: Will not experience complications related to urinary retention Outcome: Progressing   Problem: Pain Managment: Goal: General experience of comfort will improve Outcome: Progressing   Problem: Safety: Goal: Ability to remain free from injury will improve Outcome: Progressing   Problem: Skin Integrity: Goal: Risk for impaired skin integrity will decrease Outcome: Progressing   Problem: Education: Goal: Knowledge of General Education information will improve Description: Including pain rating scale, medication(s)/side effects and non-pharmacologic comfort measures Outcome: Not Progressing   

## 2020-09-02 NOTE — Evaluation (Signed)
Occupational Therapy Evaluation Patient Details Name: Andrea Martin MRN: [id number] DOB: 03/05/1935 Today's Date: 09/02/2020    History of Present Illness 85 y.o. female presenting to ED 7/9 s/p mechanical fall with R-sided rib pain. Patient hypoxic in ED. CT chest (+) multiple R-sided rib fractures with associated small R-sided pneumothorax and mild subcutaneous emphysema. Imaging also (+) for mild atelectasis and effusion on R. PMHx significant for recurent falls with injury, HTN, HLD, neuropathy and anxiety/depression.   Clinical Impression   PTA patient was living with her spouse for whom she is the primary caregiver for and daughter who works outside of the home and was grossly I with ADLs/IADLs without AD. Patient currently functioning below baseline demonstrating observed ADLs /ADL transfers with Min guard to Min A grossly. Patient also limited by deficits listed below including pain in R flank with movement and decreased balance and would benefit from continued acute OT services in prep for safe d/c home with HHOT. Patient states that her granddaughter should be able to provide assist for her husband over the next several weeks. OT will continue to follow acutely.      Follow Up Recommendations  Home health OT;Supervision/Assistance - 24 hour    Equipment Recommendations  None recommended by OT (Patient has necessary DME.)    Recommendations for Other Services       Precautions / Restrictions Precautions Precautions: Fall Restrictions Weight Bearing Restrictions: No      Mobility Bed Mobility Overal bed mobility: Needs Assistance Bed Mobility: Rolling;Sidelying to Sit Rolling: Supervision Sidelying to sit: Min assist       General bed mobility comments: Able to roll in supine with supervision A and cues for technique. Sidelying to sit with Min A to elevate trunk secondary to increased pain in R flank.    Transfers Overall transfer level: Needs assistance Equipment  used: 1 person hand held assist;Rolling walker (2 wheeled) Transfers: Sit to/from Stand Sit to Stand: Min guard         General transfer comment: Min guard for safety/steadying and cues for hand placement. Increased pain in R flank with use of RUE to push from bed surface and recliner.    Balance Overall balance assessment: Needs assistance Sitting-balance support: Single extremity supported;No upper extremity supported;Feet supported Sitting balance-Leahy Scale: Fair     Standing balance support: Single extremity supported;Bilateral upper extremity supported;During functional activity Standing balance-Leahy Scale: Poor Standing balance comment: Reliant on at least UE support to maintain dynamic balance.                           ADL either performed or assessed with clinical judgement   ADL Overall ADL's : Needs assistance/impaired     Grooming: Min guard;Standing Grooming Details (indicate cue type and reason): Min gaurd for steadying/safety.         Upper Body Dressing : Set up;Sitting Upper Body Dressing Details (indicate cue type and reason): Donned anterior hospital gown. seated in recliner. Lower Body Dressing: Minimal assistance;Sit to/from stand Lower Body Dressing Details (indicate cue type and reason): Pain with figure-4 position requiring Min A to thread BLE through LB clothing. Steadying in standing to hike underwear over hips. Toilet Transfer: Hydrographic surveyor Details (indicate cue type and reason): Simulated with transfer to recliner. Increased pain with reaching posteriorly for armrest. Patient education on keeping BUE at sides when attempting to sit. Toileting- Clothing Manipulation and Hygiene: Minimal assistance Toileting - Clothing Manipulation Details (  indicate cue type and reason): Incontinent of urine with removal of purewick. Patient did not immediatley alert this therapist of urinary incontinence. ?May have misunderstood explanation  of removal of purewick?     Functional mobility during ADLs: Min guard General ADL Comments: Patient greatly limited by pain in R flank and decreased balance.     Vision Baseline Vision/History: Wears glasses Wears Glasses: At all times Patient Visual Report: No change from baseline       Perception     Praxis      Pertinent Vitals/Pain Pain Assessment: Faces Faces Pain Scale: Hurts even more Pain Location: R flank Pain Descriptors / Indicators: Sharp Pain Intervention(s): Limited activity within patient's tolerance;Monitored during session;Repositioned;Premedicated before session     Hand Dominance Right   Extremity/Trunk Assessment Upper Extremity Assessment Upper Extremity Assessment: Generalized weakness   Lower Extremity Assessment Lower Extremity Assessment: Defer to PT evaluation   Cervical / Trunk Assessment Cervical / Trunk Assessment: Kyphotic   Communication Communication Communication: No difficulties   Cognition Arousal/Alertness: Lethargic;Suspect due to medications Behavior During Therapy: [facility] for tasks assessed/performed Overall Cognitive Status: Within Functional Limits for tasks assessed                                 General Comments: Initially stating place as [place] but self-corrects and reports fall while preparing for a trip to [place].   General Comments  Patient on RA upon entry. SpO2 >90% at rest nd 89% with activity. RN notified.    Exercises Exercises: Other exercises Other Exercises Other Exercises: IS x10   Shoulder Instructions      Home Living Family/patient expects to be discharged to:: Private residence Living Arrangements: Children;Spouse/significant other (Husband has dementia (requires 24hr supervision/assist), daughter works during the day) Available Help at Discharge: Family Type of Home: House Home Access: Stairs to enter Secretary/administrator of Steps: 5 Entrance Stairs-Rails: Right;Left;Can reach  both Home Layout: Two level;Able to live on main level with bedroom/bathroom Alternate Level Stairs-Number of Steps: Full flight to 2nd level; patient with bedroom/bathroom on main level   Bathroom Shower/Tub: Walk-in shower     Bathroom Accessibility:  (comfort height)   Home Equipment: Walker - 2 wheels;Cane - quad;Bedside commode;Wheelchair - manual          Prior Functioning/Environment Level of Independence: Independent        Comments: Independent with ADLs/IADLs without AD; drives; caregiver for her husband        OT Problem List: Impaired balance (sitting and/or standing);Decreased knowledge of use of DME or AE;Pain      OT Treatment/Interventions: Self-care/ADL training;Therapeutic exercise;DME and/or AE instruction;Therapeutic activities;Patient/family education;Balance training    OT Goals(Current goals can be found in the care plan section) Acute Rehab OT Goals Patient Stated Goal: To return home. OT Goal Formulation: With patient Time For Goal Achievement: 09/16/20 Potential to Achieve Goals: Good ADL Goals Pt Will Perform Grooming: Independently;standing Pt Will Perform Upper Body Dressing: Independently Pt Will Perform Lower Body Dressing: Independently;sit to/from stand Pt Will Transfer to Toilet: Independently;ambulating Pt Will Perform Toileting - Clothing Manipulation and hygiene: Independently;sit to/from stand Additional ADL Goal #1: Patient will recall 3 strategies to reduce risk of falls in prep for safe d/c home.  OT Frequency: Min 2X/week   Barriers to D/C:            Co-evaluation  AM-PAC OT "6 Clicks" Daily Activity     Outcome Measure Help from another person eating meals?: None Help from another person taking care of personal grooming?: A Little Help from another person toileting, which includes using toliet, bedpan, or urinal?: A Little Help from another person bathing (including washing, rinsing, drying)?: A  Little Help from another person to put on and taking off regular upper body clothing?: A Little Help from another person to put on and taking off regular lower body clothing?: A Little 6 Click Score: 19   End of Session Equipment Utilized During Treatment: Rolling walker Nurse Communication: Mobility status;Other (comment) (Response to treatment.)  Activity Tolerance: Patient tolerated treatment well Patient left: in bed;with call bell/phone within reach;with chair alarm set  OT Visit Diagnosis: Unsteadiness on feet (R26.81);Other abnormalities of gait and mobility (R26.89);Muscle weakness (generalized) (M62.81);Pain Pain - Right/Left: Right Pain - part of body:  (Flank)                Time: 3762-8315 OT Time Calculation (min): 28 min Charges:  OT General Charges $OT Visit: 1 Visit OT Evaluation $OT Eval Low Complexity: 1 Low OT Treatments $Self Care/Home Management : 8-22 mins  Orlen Leedy H. OTR/L Supplemental OT, Department of rehab services [phone]  Kevaughn Ewing R H. 09/02/2020, 10:27 AM

## 2020-09-02 NOTE — TOC CAGE-AID Note (Signed)
Transition of Care [facility]) - CAGE-AID Screening   Patient Details  Name: Andrea Martin MRN: [id number] Date of Birth: March 31, 1935  [facility] CM/SW Contact:    Warren Lindahl C Tarpley-Carter, LCSWA Phone Number: 09/02/2020, 8:24 AM   Clinical Narrative: Pt is unable to participate in Cage Aid (Dementia).  Kahner Yanik Tarpley-Carter, MSW, LCSW-A Pronouns:  She/Her/Hers                          [name] Clinical Social WorkerTransitions of Care Cell:  [phone] [name]   CAGE-AID Screening: Substance Abuse Screening unable to be completed due to: : Patient unable to participate

## 2020-09-02 NOTE — Progress Notes (Signed)
Patient ID: Andrea Martin, female   DOB: [birth year], 85 y.o.   MRN: [id number]     Subjective: Up in chair, C/O R posterior rib pain, no SOB ROS negative except as listed above. Objective: Vital signs in last 24 hours: Temp:  [97.7 F (36.5 C)-98.6 F (37 C)] 97.7 F (36.5 C) (07/11 0344) Pulse Rate:  [52-71] 71 (07/11 0344) Resp:  [16-18] 16 (07/11 0344) BP: (128-208)/(47-96) 137/63 (07/11 1100) SpO2:  [94 %-100 %] 94 % (07/11 1100) Weight:  [70.8 kg-74 kg] 74 kg (07/10 1700) Last BM Date: 08/31/20  Intake/Output from previous day: No intake/output data recorded. Intake/Output this shift: Total I/O In: 240 [P.O.:240] Out: -   General appearance: alert and cooperative Resp: clear to auscultation bilaterally Chest wall: right sided chest wall tenderness, posteriorly Cardio: regular rate and rhythm GI: benign  Lab Results: CBC  Recent Labs    08/31/20 2200 09/01/20 0539  WBC 11.1* 6.8  HGB 12.9 12.0  HCT 38.5 36.4  PLT 213 115*   BMET Recent Labs    08/31/20 2200  NA 141  K 3.7  CL 109  CO2 29  GLUCOSE 125*  BUN 18  CREATININE 0.94  CALCIUM 9.1   PT/INR No results for input(s): LABPROT, INR in the last 72 hours. ABG No results for input(s): PHART, HCO3 in the last 72 hours.  Invalid input(s): PCO2, PO2  Studies/Results: DG Chest 2 View  Result Date: 09/01/2020 CLINICAL DATA:  Multiple RIGHT-sided rib fractures.  Pneumothorax. EXAM: CHEST - 2 VIEW COMPARISON:  Chest x-ray and chest CT dated 08/31/2020 FINDINGS: Heart size and mediastinal contours are stable. Stable mild atelectasis and/or small pleural effusion at the RIGHT lung base. Additional probable atelectasis at the LEFT lung base. No pneumothorax is seen. The multiple displaced RIGHT-sided rib fractures appear grossly stable in alignment. No new osseous abnormality seen. IMPRESSION: No significant change. Multiple displaced RIGHT-sided rib fractures. Stable mild atelectasis and/or small pleural  effusion at the RIGHT lung base. No pneumothorax seen. Electronically Signed   By: Bary Richard M.D.   On: 09/01/2020 06:39   DG Ribs Unilateral W/Chest Right  Result Date: 08/31/2020 CLINICAL DATA:  Right-sided rib pain following fall, initial encounter EXAM: RIGHT RIBS AND CHEST - 3+ VIEW COMPARISON:  None. FINDINGS: Multiple right-sided rib fractures are noted to include the third through seventh ribs. The fractures of the sixth and seventh ribs are significantly displaced. Multifocal fractures in the fifth and sixth ribs are noted posteriorly. No underlying pneumothorax is seen. Some subcutaneous emphysema is noted. Increased basilar opacity is noted on the right likely related to contusion. No other focal abnormality is noted. IMPRESSION: Multiple right-sided rib fractures with underlying contusion. No definitive pneumothorax is seen although some subcutaneous emphysema is noted. Electronically Signed   By: Alcide Clever M.D.   On: 08/31/2020 21:52   CT CHEST WO CONTRAST  Result Date: 08/31/2020 CLINICAL DATA:  Recent fall with multiple right-sided rib fractures, initial encounter EXAM: CT CHEST WITHOUT CONTRAST TECHNIQUE: Multidetector CT imaging of the chest was performed following the standard protocol without IV contrast. COMPARISON:  Rib series obtained earlier in the same day FINDINGS: Cardiovascular: Significantly limited due to lack of IV contrast. Diffuse atherosclerotic calcifications are noted in the aorta. No aneurysmal dilatation is seen. No cardiac enlargement is noted. Mild coronary calcifications are seen. Mediastinum/Nodes: Thoracic inlet is within normal limits. No sizable hilar or mediastinal adenopathy is noted. The esophagus as visualized is within normal limits. Lungs/Pleura: Lungs  are well aerated bilaterally. Left lung shows some minimal subpleural fibrotic changes in the lower lobe. Right lung demonstrates mild lower lobe atelectasis and a small effusion. Tiny lateral pneumothorax  is seen related to displaced rib fractures. This is better visualized than on prior plain film examination. Associated subcutaneous emphysema is seen. Upper Abdomen: Visualized upper abdomen is unremarkable. Musculoskeletal: Degenerative changes of the thoracic spine are noted. No rib abnormality on the left is seen. Calcified loose bodies are noted in the right shoulder joint. Multiple right-sided rib fractures are noted to include the third, fourth, fifth, sixth and seventh ribs laterally. Posterior rib fractures are noted on the fourth through ninth ribs on the right. The right fifth, sixth and seventh rib fractures laterally are significantly displaced creating the small pneumothorax on the right. No other rib fractures are seen. IMPRESSION: Multiple right-sided rib fractures as described with associated small right-sided pneumothorax and mild subcutaneous emphysema. Mild associated atelectasis and effusion is noted on the right. No other acute abnormality is noted. Aortic Atherosclerosis (ICD10-I70.0). Electronically Signed   By: Alcide Clever M.D.   On: 08/31/2020 23:53    Anti-infectives: Anti-infectives (From admission, onward)    None       Assessment/Plan: GLF  R rib FX 4-9 with occult PTX - F/U CXR no PTX. Did 750 on IS for me. Multimodal pain control and pulm toilet.  LOS: 1 day    Violeta Gelinas, MD, MPH, FACS Trauma & General Surgery Use [email or web address] to contact on call provider  09/02/2020

## 2020-09-03 DIAGNOSIS — S2241XD Multiple fractures of ribs, right side, subsequent encounter for fracture with routine healing: Secondary | ICD-10-CM | POA: Diagnosis not present

## 2020-09-03 DIAGNOSIS — J939 Pneumothorax, unspecified: Secondary | ICD-10-CM | POA: Diagnosis not present

## 2020-09-03 LAB — CBC
HCT: 37 % (ref 36.0–46.0)
Hemoglobin: 12.6 g/dL (ref 12.0–15.0)
MCH: 32 pg (ref 26.0–34.0)
MCHC: 34.1 g/dL (ref 30.0–36.0)
MCV: 93.9 fL (ref 80.0–100.0)
Platelets: 181 10*3/uL (ref 150–400)
RBC: 3.94 MIL/uL (ref 3.87–5.11)
RDW: 11.9 % (ref 11.5–15.5)
WBC: 8.4 10*3/uL (ref 4.0–10.5)
nRBC: 0 % (ref 0.0–0.2)

## 2020-09-03 NOTE — Progress Notes (Signed)
Physical Therapy Treatment Patient Details Name: Andrea Martin MRN: [id number] DOB: [birth year] Today's Date: 09/03/2020    History of Present Illness 85 y.o. female presenting to ED 7/9 s/p mechanical fall with R-sided rib pain. Patient hypoxic in ED. CT chest (+) multiple R-sided rib fractures with associated small R-sided pneumothorax and mild subcutaneous emphysema. Imaging also (+) for mild atelectasis and effusion on R. PMHx significant for recurent falls with injury, HTN, HLD, neuropathy and anxiety/depression.    PT Comments    Pt initially disoriented and confused. Disorientation/confusion decreased following conversation with daughter. Pt required min A during bed mobility with +2 physical assistance due to pain. +2 physical assistance not required. Pt required min A with sit to stand transfer initially, but sit to stand improved to min guard in bathroom with use of R handrail and RW. Pt ambulated 249ft with improved improved navigation of obstacles and required less assistance with guiding RW. Recommend continuation of PT to progress toward goals, including addressing mild balance and gait impairments, and assess stair navigation.   Follow Up Recommendations  Home health PT     Equipment Recommendations    None recommended by PT   Recommendations for Other Services       Precautions / Restrictions Precautions Precautions: Fall Restrictions Weight Bearing Restrictions: No    Mobility  Bed Mobility Overal bed mobility: Needs Assistance Bed Mobility: Sit to Supine;Supine to Sit     Supine to sit: +2 for physical assistance;Min assist Sit to supine: Min guard   General bed mobility comments: On arrival, pt lying with feet off bed with head flat toward middle of bed. Both bedrails up. Supine to sit pain limiting. +2 physical assistance with one person holding each hand.    Transfers Overall transfer level: Needs assistance Equipment used: Rolling walker (2  wheeled) Transfers: Sit to/from Stand Sit to Stand: Min assist;+2 physical assistance         General transfer comment: Initally, pt had difficulty with sit to stand from bed. Verbal and tactile cueing provided for placement of hands at seated and standing positions. +2 physical assistance, one person at each hand. Sit to stand in bathroom, pt required min guard from PT and assistance from RW and R rail in bathroom. Vitals in seated in restroom - HR 70 spO2 95  Ambulation/Gait Ambulation/Gait assistance: Min guard Gait Distance (Feet): 250 Feet Assistive device: Rolling walker (2 wheeled) Gait Pattern/deviations: Step-through pattern   Gait velocity interpretation: 1.31 - 2.62 ft/sec, indicative of limited community ambulator General Gait Details: Pt maintained cervical flexion to view feet while ambulating. Verbal cueing provided to lift head up. Pt demonstrated improved navigation of obstacles. Guidance of RW while pivoting required. Pt more oriented and less confused during ambulation   Stairs             Wheelchair Mobility    Modified Rankin (Stroke Patients Only)       Balance Overall balance assessment: Needs assistance Sitting-balance support: Feet supported;Single extremity supported Sitting balance-Leahy Scale: Good Sitting balance - Comments: Used L UE to maintain balance in seated when pain experienced   Standing balance support: Bilateral upper extremity supported Standing balance-Leahy Scale: Fair Standing balance comment: Pt used UE and RW to maintain standing and dynamic balance. Pt watches feet to ensure they are correctly placed.                            Cognition  Behavior During Therapy: Agitated Overall Cognitive Status: No family/caregiver present to determine baseline cognitive functioning                                 General Comments: Pt disoriented and confused at start of session. Pt stated she was "Andrea Martin with birthdate [birth year]" and did not know she was in the hospital or recall information of how she arrived. Orientation improved toward the middle of the session after calling daughter. Pt able to recall her name and MOI at the end of session.      Exercises      General Comments        Pertinent Vitals/Pain Vitals stable throughout session Faces Pain Scale: Hurts even more Pain Location: Pt pointed to multiple areas around ribcage - R thoracic spine (approximately ribs 8-10); R flank (ribs 5-6) Pain Descriptors / Indicators: Sharp Pain Intervention(s): Repositioned;Monitored during session;Other (comment) (RN donned patch at the end of session. RN did not apply prior due to pt's disorientation and agitation.)    Home Living                      Prior Function            PT Goals (current goals can now be found in the care plan section) Acute Rehab PT Goals Patient Stated Goal: To return home. PT Goal Formulation: Patient unable to participate in goal setting Time For Goal Achievement: 09/16/20 Potential to Achieve Goals: Good Progress towards PT goals: Progressing toward goals    Frequency    Min 3X/week      PT Plan Current plan remains appropriate    Co-evaluation              AM-PAC PT "6 Clicks" Mobility   Outcome Measure  Help needed turning from your back to your side while in a flat bed without using bedrails?: A Little Help needed moving from lying on your back to sitting on the side of a flat bed without using bedrails?: A Little Help needed moving to and from a bed to a chair (including a wheelchair)?: A Little Help needed standing up from a chair using your arms (e.g., wheelchair or bedside chair)?: A Little Help needed to walk in hospital room?: A Little Help needed climbing 3-5 steps with a railing? : A Lot 6 Click Score: 17    End of Session Equipment Utilized During Treatment: Gait belt Activity Tolerance: Patient tolerated  treatment well;Other (comment) (Pt initially disoriented and confused which impacted treatment ability at the beginning. Improved congitive status mid-way through session.) Patient left: with bed alarm set;with call bell/phone within reach;in bed;with nursing/sitter in room Nurse Communication: Mobility status PT Visit Diagnosis: Unsteadiness on feet (R26.81);Other abnormalities of gait and mobility (R26.89);Repeated falls (R29.6);Muscle weakness (generalized) (M62.81);History of falling (Z91.81);Pain Pain - Right/Left: Right     Time: 6387-5643 PT Time Calculation (min) (ACUTE ONLY): 42 min  Charges:  $Gait Training: 8-22 mins $Therapeutic Activity: 8-22 mins $Self Care/Home Management: 8-22                     Velda Shell, SPT Acute Rehab: [phone]    Vance Gather 09/03/2020, 11:10 AM

## 2020-09-03 NOTE — Plan of Care (Signed)
  Problem: Nutrition: Goal: Adequate nutrition will be maintained Outcome: Progressing   Problem: Coping: Goal: Level of anxiety will decrease Outcome: Progressing   Problem: Pain Managment: Goal: General experience of comfort will improve Outcome: Progressing   Problem: Safety: Goal: Ability to remain free from injury will improve Outcome: Progressing   

## 2020-09-03 NOTE — TOC Progression Note (Signed)
Transition of Care [facility]) - Progression Note    Patient Details  Name: Andrea Martin MRN: [id number] Date of Birth: 1935/03/29  Transition [facility] Care [facility]) CM/SW Contact  Lorri Frederick, LCSW Phone Number: 09/03/2020, 10:28 AM  Clinical Narrative:    CSW spoke with [facility]: declines this referral.     Expected Discharge Plan: Home w Home Health Services Barriers to Discharge: Continued Medical Work up, Other (must enter comment) (HH with [facility] Medicare payer)  Expected Discharge Plan and Services Expected Discharge Plan: Home w Home Health Services     Post Acute Care Choice: Home Health Living arrangements for the past 2 months: Single Family Home                                       Social Determinants of Health (SDOH) Interventions    Readmission Risk Interventions No flowsheet data found.

## 2020-09-03 NOTE — Progress Notes (Signed)
Patient ID: Andrea Martin, female   DOB: 1936-01-28, 85 y.o.   MRN: [id number]     Subjective: Laying in bed.  Denies pain but is very confused and at times combative saying she is going to knock me out with the heart monitor.  She is fidgeting with it.  She thinks it is 1856, Kyung Rudd is present, and we are at someone's house.  ROS negative except as listed above. Objective: Vital signs in last 24 hours: Temp:  [98.1 F (36.7 C)-98.7 F (37.1 C)] 98.1 F (36.7 C) (07/12 0300) Pulse Rate:  [64-71] 66 (07/12 0300) Resp:  [17-18] 17 (07/12 0300) BP: (120-189)/(63-90) 138/66 (07/12 0300) SpO2:  [94 %-98 %] 98 % (07/12 0300) Last BM Date: 08/31/20  Intake/Output from previous day: 07/11 0701 - 07/12 0700 In: 480 [P.O.:480] Out: 250 [Urine:250] Intake/Output this shift: No intake/output data recorded.  General appearance: alert and agitated Resp: clear to auscultation bilaterally Chest wall: right sided chest wall tenderness, posteriorly Cardio: regular rate and rhythm GI: benign, +BS, ND  Lab Results: CBC  Recent Labs    09/01/20 0539 09/03/20 0354  WBC 6.8 8.4  HGB 12.0 12.6  HCT 36.4 37.0  PLT 115* 181   BMET Recent Labs    08/31/20 2200  NA 141  K 3.7  CL 109  CO2 29  GLUCOSE 125*  BUN 18  CREATININE 0.94  CALCIUM 9.1   PT/INR No results for input(s): LABPROT, INR in the last 72 hours. ABG No results for input(s): PHART, HCO3 in the last 72 hours.  Invalid input(s): PCO2, PO2  Studies/Results: No results found.  Anti-infectives: Anti-infectives (From admission, onward)    None       Assessment/Plan: GLF  R rib FX 4-9 with occult PTX - F/U CXR no PTX.  Multimodal pain control and pulm toilet.  No further trauma interventions warranted.  Therapies recommend HH.  We will sign off at this time. Delirium - defer to primary service.  LOS: 2 days   Letha Cape PA-C Trauma & General Surgery Use [email or web address] to contact on call  provider  09/03/2020

## 2020-09-03 NOTE — Plan of Care (Signed)

## 2020-09-03 NOTE — Progress Notes (Signed)
PROGRESS NOTE    Andrea Martin   NWG:[id number]  DOB: [birth year]  PCP: Wanda Plump, MD    DOA: 08/31/2020 LOS: 2   Assessment & Plan   Principal Problem:   Pneumothorax on right Active Problems:   HLD (hyperlipidemia)   Anxiety and depression   Multiple rib fractures   HTN (hypertension)   Multiple right-sided rib fractures due to mechanical fall  Small right pneumothorax due to above - Pneumothorax appears resolved on repeat chest x-ray 7/11.  Did not require chest tube. --Trauma surgery consulted, signed off --Multimodal pain control per orders: Tylenol, Voltaren patch as needed Robaxin, oxycodone --Frequent incentive spirometry --Mobilize as tolerated --Supplement oxygen PRN, maintain sat above 90% --PT and OT recommend HH - TOC following --Fall precautions  Delirium - most likely due to pain medications and hospital environment.  Today pt lethargic, unable to stay awake, confused. --Delirium precautions:     -Lights and TV off, minimize interruptions at night    -Blinds open and lights on during day    -Glasses/hearing aid with patient    -Frequent reorientation    -PT/OT when able    -MINIMIZE SEDATING MEDS  Thombocytopenia resolved - Plts 213k >> 115k >> 181k on CBC.  Monitor.  Essential hypertension - elevated likely due to pain, better controlled today. --Continue losartan, hydralazine as needed --Control pain  Hyperlipidemia -continue pravastatin  Anxiety and depression -continue home Lexapro, Wellbutrin and Xanax    Patient BMI: Body mass index is 27.15 kg/m.   DVT prophylaxis: SCDs Start: 09/01/20 0058   Diet:  Diet Orders (From admission, onward)     Start     Ordered   09/01/20 0126  Diet Heart Room service appropriate? Yes; Fluid consistency: Thin  Diet effective now       Question Answer Comment  Room service appropriate? Yes   Fluid consistency: Thin      09/01/20 0125              Code Status: Full Code   Brief Narrative /  Hospital Course to Date:   85 y.o. female with medical history significant of hypertension, hyperlipidemia, neuropathy, anxiety, depression presented to the ED complaining of right-sided rib pain after a mechanical fall at home when she caught her foot on a rug.  She was found to have multiple right-sided rib fractures and a small pneumothorax with mild associated hypoxia.  Subjective 09/03/20    Pt seen resting in bed, wakes to my voice but pretty lethargic and unable to keep her eyes open.  Denies having pain right now.  Able to tell me her name, but not other orientation questions, falls back to sleep.  No acute events reported.   Disposition Plan & Communication   Status is: Inpatient  Remains inpatient appropriate because:Inpatient level of care appropriate due to severity of illness  Dispo: The patient is from: Home              Anticipated d/c is to: Home              Patient currently is not medically stable to d/c.   Difficult to place patient No   Family Communication: None at bedside on rounds today, will attempt to call   Consults, Procedures, Significant Events   Consultants:  Trauma surgery  Procedures:  None  Antimicrobials:  Anti-infectives (From admission, onward)    None         Micro    Objective  Vitals:   09/02/20 1100 09/02/20 1601 09/02/20 2039 09/03/20 0300  BP: 137/63 120/90 (!) 189/70 138/66  Pulse:  71 64 66  Resp:  17 18 17   Temp:  98.4 F (36.9 C) 98.7 F (37.1 C) 98.1 F (36.7 C)  TempSrc:      SpO2: 94% 94% 97% 98%  Weight:      Height:        Intake/Output Summary (Last 24 hours) at 09/03/2020 1318 Last data filed at 09/03/2020 1200 Gross per 24 hour  Intake 540 ml  Output 1100 ml  Net -560 ml   Filed Weights   09/01/20 1240 09/01/20 1700  Weight: 70.8 kg 74 kg    Physical Exam:  General exam: lethargic, responds briefly and appropriately, no acute distress Respiratory system: CTAB with normal respiratory  effort, on room air Cardiovascular system: normal S1/S2, RRR, no pedal edema.   Gastrointestinal system: soft, NT, ND Central nervous system: oriented to self, too lethargic to otherwise participate in neuro exam, no gross focal neurologic deficits Extremities: moves all, no edema, normal tone   Labs   Data Reviewed: I have personally reviewed following labs and imaging studies  CBC: Recent Labs  Lab 08/31/20 2200 09/01/20 0539 09/03/20 0354  WBC 11.1* 6.8 8.4  HGB 12.9 12.0 12.6  HCT 38.5 36.4 37.0  MCV 98.0 97.8 93.9  PLT 213 115* 181   Basic Metabolic Panel: Recent Labs  Lab 08/31/20 2200  NA 141  K 3.7  CL 109  CO2 29  GLUCOSE 125*  BUN 18  CREATININE 0.94  CALCIUM 9.1   GFR: Estimated Creatinine Clearance: 45.7 mL/min (by C-G formula based on SCr of 0.94 mg/dL). Liver Function Tests: No results for input(s): AST, ALT, ALKPHOS, BILITOT, PROT, ALBUMIN in the last 168 hours. No results for input(s): LIPASE, AMYLASE in the last 168 hours. No results for input(s): AMMONIA in the last 168 hours. Coagulation Profile: No results for input(s): INR, PROTIME in the last 168 hours. Cardiac Enzymes: No results for input(s): CKTOTAL, CKMB, CKMBINDEX, TROPONINI in the last 168 hours. BNP (last 3 results) No results for input(s): PROBNP in the last 8760 hours. HbA1C: No results for input(s): HGBA1C in the last 72 hours. CBG: No results for input(s): GLUCAP in the last 168 hours. Lipid Profile: No results for input(s): CHOL, HDL, LDLCALC, TRIG, CHOLHDL, LDLDIRECT in the last 72 hours. Thyroid Function Tests: No results for input(s): TSH, T4TOTAL, FREET4, T3FREE, THYROIDAB in the last 72 hours. Anemia Panel: No results for input(s): VITAMINB12, FOLATE, FERRITIN, TIBC, IRON, RETICCTPCT in the last 72 hours. Sepsis Labs: No results for input(s): PROCALCITON, LATICACIDVEN in the last 168 hours.  Recent Results (from the past 240 hour(s))  SARS CORONAVIRUS 2 (TAT 6-24 HRS)  Nasopharyngeal Nasopharyngeal Swab     Status: None   Collection Time: 08/31/20 10:00 PM   Specimen: Nasopharyngeal Swab  Result Value Ref Range Status   SARS Coronavirus 2 NEGATIVE NEGATIVE Final    Comment: (NOTE) SARS-CoV-2 target nucleic acids are NOT DETECTED.  The SARS-CoV-2 RNA is generally detectable in upper and lower respiratory specimens during the acute phase of infection. Negative results do not preclude SARS-CoV-2 infection, do not rule out co-infections with other pathogens, and should not be used as the sole basis for treatment or other patient management decisions. Negative results must be combined with clinical observations, patient history, and epidemiological information. The expected result is Negative.  Fact Sheet for Patients: 11/01/20  Fact Sheet for Healthcare  Providers: quierodirigir.com  This test is not yet approved or cleared by the [place] and  has been authorized for detection and/or diagnosis of SARS-CoV-2 by FDA under an Emergency Use Authorization (EUA). This EUA will remain  in effect (meaning this test can be used) for the duration of the COVID-19 declaration under Se ction 564(b)(1) of the Act, 21 U.S.C. section 360bbb-3(b)(1), unless the authorization is terminated or revoked sooner.  Performed at [facility], [address], [place], [place] [address]       Imaging Studies   No results found.   Medications   Scheduled Meds:  acetaminophen  1,000 mg Oral Q6H   ALPRAZolam  0.25-0.5 mg Oral BID   buPROPion  300 mg Oral q morning   diclofenac  1 patch Transdermal BID   escitalopram  20 mg Oral q morning   lip balm  1 application Topical BID   losartan  50 mg Oral q morning   multivitamin with minerals  1 tablet Oral q morning   pravastatin  20 mg Oral QHS   Continuous Infusions:  albumin human     methocarbamol (ROBAXIN) IV     ondansetron (ZOFRAN) IV          LOS: 2 days    Time spent: 25 minutes     Pennie Banter, DO [facility]  09/03/2020, 1:18 PM      If 7PM-7AM, please contact night-coverage. How to contact the [facility] Attending or Consulting provider 7A - 7P or covering provider during after hours 7P -7A, for this patient?    Check the care team in [facility] and look for a) attending/consulting TRH provider listed and b) the [facility] team listed Log into [email or web address] and use [facility]'s universal password to access. If you do not have the password, please contact the hospital operator. Locate the [facility] provider you are looking for under [facility] and page to a number that you can be directly reached. If you still have difficulty reaching the provider, please page the [facility] (Director on Call) for the Hospitalists listed on amion for assistance.

## 2020-09-04 ENCOUNTER — Other Ambulatory Visit (HOSPITAL_BASED_OUTPATIENT_CLINIC_OR_DEPARTMENT_OTHER): Payer: Self-pay

## 2020-09-04 DIAGNOSIS — J939 Pneumothorax, unspecified: Secondary | ICD-10-CM | POA: Diagnosis not present

## 2020-09-04 MED ORDER — POLYETHYLENE GLYCOL 3350 17 GM/SCOOP PO POWD
17.0000 g | Freq: Every day | ORAL | 1 refills | Status: DC
  Filled 2020-09-04: qty 238, 14d supply, fill #0

## 2020-09-04 MED ORDER — POLYETHYLENE GLYCOL 3350 17 G PO PACK
17.0000 g | PACK | Freq: Every day | ORAL | Status: DC
  Administered 2020-09-04: 17 g via ORAL
  Filled 2020-09-04: qty 1

## 2020-09-04 MED ORDER — ACETAMINOPHEN 500 MG PO TABS
1000.0000 mg | ORAL_TABLET | Freq: Four times a day (QID) | ORAL | 0 refills | Status: AC
Start: 2020-09-04 — End: ?

## 2020-09-04 MED ORDER — SENNOSIDES-DOCUSATE SODIUM 8.6-50 MG PO TABS
1.0000 | ORAL_TABLET | Freq: Two times a day (BID) | ORAL | 0 refills | Status: DC
  Filled 2020-09-04: qty 30, 15d supply, fill #0

## 2020-09-04 MED ORDER — OXYCODONE HCL 5 MG PO TABS
5.0000 mg | ORAL_TABLET | ORAL | 0 refills | Status: AC | PRN
  Filled 2020-09-04: qty 30, 3d supply, fill #0

## 2020-09-04 MED ORDER — DICLOFENAC EPOLAMINE 1.3 % EX PTCH
1.0000 | MEDICATED_PATCH | Freq: Two times a day (BID) | CUTANEOUS | 1 refills | Status: DC
  Filled 2020-09-04 – 2020-09-05 (×2): qty 30, 15d supply, fill #0

## 2020-09-04 MED ORDER — SENNOSIDES-DOCUSATE SODIUM 8.6-50 MG PO TABS
1.0000 | ORAL_TABLET | Freq: Two times a day (BID) | ORAL | Status: DC
  Administered 2020-09-04: 1 via ORAL
  Filled 2020-09-04: qty 1

## 2020-09-04 MED ORDER — METHOCARBAMOL 500 MG PO TABS
1000.0000 mg | ORAL_TABLET | Freq: Four times a day (QID) | ORAL | 0 refills | Status: DC | PRN
  Filled 2020-09-04: qty 30, 4d supply, fill #0

## 2020-09-04 MED ORDER — BISACODYL 5 MG PO TBEC: 5.0000 mg | DELAYED_RELEASE_TABLET | Freq: Every day | ORAL | Status: DC | PRN

## 2020-09-04 NOTE — Progress Notes (Signed)
Physical Therapy Treatment Patient Details Name: Andrea Martin MRN: [id number] DOB: June 11, 1935 Today's Date: 09/04/2020    PT note Updated equipment as below:          Follow Up Recommendations  Home health PT     Equipment Recommendations  Rolling walker with 5" wheels;3in1 (PT)                 Velda Shell, SPT Acute Rehab: [phone]   Vance Gather 09/04/2020, 12:15 PM

## 2020-09-04 NOTE — TOC Transition Note (Signed)
Transition of Care [facility]) - CM/SW Discharge Note   Patient Details  Name: Andrea Martin MRN: [id number] Date of Birth: 1935/04/13  [facility] CM/SW Contact:  Lorri Frederick, LCSW Phone Number: 09/04/2020, 1:54 PM   Clinical Narrative:   Pt discharging home with [facility].  DME 3n1 and rolling walker through [employer].  No other needs identified.  Daughter here in room to transport pt home.      Final next level of care: Home w Home Health Services Barriers to Discharge: Barriers Resolved   Patient Goals and CMS Choice Patient states their goals for this hospitalization and ongoing recovery are:: "get well" CMS Medicare.gov Compare Post Acute Care list provided to:: Patient Represenative (must comment) Choice offered to / list presented to : Adult Children  Discharge Placement                       Discharge Plan and Services     Post Acute Care Choice: Home Health          DME Arranged: 3-N-1, Walker rolling DME Agency: [employer] Date DME Agency Contacted: 09/04/20 Time DME Agency Contacted: [phone] Representative spoke with at DME Agency: Elease Hashimoto HH Arranged: PT, OT [facility] Agency: [facility] Date [facility] Agency Contacted: 09/04/20 Time HH Agency Contacted: [phone] Representative spoke with at [facility] Agency: Stacie  Social Determinants of Health (SDOH) Interventions     Readmission Risk Interventions No flowsheet data found.

## 2020-09-04 NOTE — Plan of Care (Signed)

## 2020-09-04 NOTE — Discharge Summary (Signed)
Physician Discharge Summary  Andrea Martin:[id number] DOB: [birth year] DOA: 08/31/2020  PCP: Wanda Plump, MD  Admit date: 08/31/2020 Discharge date: 09/04/2020  Admitted From: home Disposition:  home  Recommendations for Outpatient Follow-up:  Follow up with PCP in 1-2 weeks Please obtain BMP/CBC in one week Please follow up on patient's recovery from right-sided rib fractures Follow up on progress with [facility] PT/OT, reassess fall risk   Home Health: PT, OT  Equipment/Devices: rolling walker, 3-n-1   Discharge Condition: stable  CODE STATUS: Full  Diet recommendation: Heart Healthy    Discharge Diagnoses: Principal Problem:   Pneumothorax on right Active Problems:   HLD (hyperlipidemia)   Anxiety and depression   Multiple rib fractures   HTN (hypertension)    Summary of HPI and Hospital Course:   85 y.o. female with medical history significant of hypertension, hyperlipidemia, neuropathy, anxiety, depression presented to the ED complaining of right-sided rib pain after a mechanical fall at home when she caught her foot on a rug.  She was found to have multiple right-sided rib fractures and a small pneumothorax with mild associated hypoxia.  Hypoxia resolved quickly once pain controlled.   Pneumothorax resolved and signs of recurrence. Patient reports pain controlled, much improved. She and daughter at bedside in agreement, patient is clinically improved and stable for d/c home with [facility] PT and OT to follow for ongoing therapy.   Multiple right-sided rib fractures due to mechanical fall  Small right pneumothorax due to above - Pneumothorax appears resolved on repeat chest x-ray 7/11.  Did not require chest tube. --Trauma surgery consulted, signed off  --Multimodal pain control per orders:  Scheduled Tylenol, Voltaren patch  PRN Robaxin, PRN oxycodone --Continue frequent incentive spirometry --No longer requiring any supplement oxygen  --PT and OT recommend HH - TOC  arranging --Fall precautions   Delirium - most likely due to pain medications and hospital environment.  This has improved and patient mental status near baseline per daughter at bedside today. --Delirium precautions during admission:    -Lights and TV off, minimize interruptions at night    -Blinds open and lights on during day    -Glasses/hearing aid with patient    -Frequent reorientation    -PT/OT when able    -MINIMIZE SEDATING MEDS   Thombocytopenia resolved - Plts 213k >> 115k >> 181k on CBC.  Monitor.   Essential hypertension - elevated likely due to pain, better controlled today. --Continue losartan, hydralazine as needed --Control pain  Hyperlipidemia -continue pravastatin  Anxiety and depression -continue home Lexapro, Wellbutrin and Xanax       Discharge Instructions   Discharge Instructions     Call MD for:  extreme fatigue   Complete by: As directed    Call MD for:  persistant dizziness or light-headedness   Complete by: As directed    Call MD for:  severe uncontrolled pain   Complete by: As directed    Call MD for:  temperature >100.4   Complete by: As directed    Diet - low sodium heart healthy   Complete by: As directed    Discharge instructions   Complete by: As directed    Continue to use Incentive Spirometer to keep your lungs well-expanded. This helps to prevent pneumonia when we don't take deep breaths due to pain from rib fractures.  Take Miralax and / or Senna for constipation caused by pain medication.   Increase activity slowly   Complete by: As directed  Allergies as of 09/04/2020       Reactions   Codeine Nausea And Vomiting   Sulfa Antibiotics Other (See Comments)   Was told allergic to sulfa does not remember why   Penicillins Rash        Medication List     TAKE these medications    acetaminophen 500 MG tablet Commonly known as: TYLENOL Take 2 tablets (1,000 mg total) by mouth every 6 (six) hours. What changed:   when to take this reasons to take this   ALPRAZolam 0.5 MG tablet Commonly known as: XANAX TAKE 1/2 - 1 TABLET BY MOUTH 3 TIMES DAILY AS NEEDED FOR ANXIETY What changed:  how much to take how to take this when to take this additional instructions   azelastine 0.1 % nasal spray Commonly known as: ASTELIN Place 2 sprays into both nostrils at bedtime as needed for rhinitis. Use in each nostril as directed   buPROPion 150 MG 12 hr tablet Commonly known as: WELLBUTRIN SR Take 2 tablets (300 mg total) by mouth daily. What changed: when to take this   Calcium 600+D3 600-800 MG-UNIT Tabs Generic drug: Calcium Carb-Cholecalciferol Take 1 tablet by mouth every morning.   diclofenac 1.3 % Ptch Commonly known as: FLECTOR Place 1 patch onto the skin 2 (two) times daily.   escitalopram 20 MG tablet Commonly known as: LEXAPRO Take 1 tablet (20 mg total) by mouth daily. What changed: when to take this   Fish Oil 600 MG Caps Take 600 mg by mouth every morning.   GLUCOSAMINE-CHONDROITIN PO Take 1 tablet by mouth every morning. Glucosamine 1500 mg, chondroitin 1200 mg   losartan 50 MG tablet Commonly known as: COZAAR Take 1 tablet (50 mg total) by mouth daily. What changed: when to take this   meclizine 12.5 MG tablet Commonly known as: ANTIVERT Take 1 tablet (12.5 mg total) by mouth 3 (three) times daily as needed for dizziness.   methocarbamol 500 MG tablet Commonly known as: ROBAXIN Take 2 tablets (1,000 mg total) by mouth every 6 (six) hours as needed for muscle spasms.   multivitamin with minerals Tabs tablet Take 1 tablet by mouth every morning.   oxyCODONE 5 MG immediate release tablet Commonly known as: Oxy IR/ROXICODONE Take 1-2 tablets (5-10 mg total) by mouth every 4 (four) hours as needed for up to 5 days for severe pain.   polyethylene glycol 17 g packet Commonly known as: MIRALAX / GLYCOLAX Take 17 g by mouth daily. Start taking on: September 05, 2020    pravastatin 20 MG tablet Commonly known as: PRAVACHOL Take 1 tablet (20 mg total) by mouth at bedtime.   senna-docusate 8.6-50 MG tablet Commonly known as: Senokot-S Take 1 tablet by mouth 2 (two) times daily.   Vitamin D3 50 MCG (2000 UT) Tabs Take 2,000 Units by mouth every morning.        Allergies  Allergen Reactions   Codeine Nausea And Vomiting   Sulfa Antibiotics Other (See Comments)    Was told allergic to sulfa does not remember why   Penicillins Rash     If you experience worsening of your admission symptoms, develop shortness of breath, life threatening emergency, suicidal or homicidal thoughts you must seek medical attention immediately by calling 911 or calling your MD immediately  if symptoms less severe.    Please note   You were cared for by a hospitalist during your hospital stay. If you have any questions about your discharge medications or  the care you received while you were in the hospital after you are discharged, you can call the unit and asked to speak with the hospitalist on call if the hospitalist that took care of you is not available. Once you are discharged, your primary care physician will handle any further medical issues. Please note that NO REFILLS for any discharge medications will be authorized once you are discharged, as it is imperative that you return to your primary care physician (or establish a relationship with a primary care physician if you do not have one) for your aftercare needs so that they can reassess your need for medications and monitor your lab values.   Consultations: Trauma surgery    Procedures/Studies: DG Chest 2 View  Result Date: 09/01/2020 CLINICAL DATA:  Multiple RIGHT-sided rib fractures.  Pneumothorax. EXAM: CHEST - 2 VIEW COMPARISON:  Chest x-ray and chest CT dated 08/31/2020 FINDINGS: Heart size and mediastinal contours are stable. Stable mild atelectasis and/or small pleural effusion at the RIGHT lung base.  Additional probable atelectasis at the LEFT lung base. No pneumothorax is seen. The multiple displaced RIGHT-sided rib fractures appear grossly stable in alignment. No new osseous abnormality seen. IMPRESSION: No significant change. Multiple displaced RIGHT-sided rib fractures. Stable mild atelectasis and/or small pleural effusion at the RIGHT lung base. No pneumothorax seen. Electronically Signed   By: Bary Richard M.D.   On: 09/01/2020 06:39   DG Ribs Unilateral W/Chest Right  Result Date: 08/31/2020 CLINICAL DATA:  Right-sided rib pain following fall, initial encounter EXAM: RIGHT RIBS AND CHEST - 3+ VIEW COMPARISON:  None. FINDINGS: Multiple right-sided rib fractures are noted to include the third through seventh ribs. The fractures of the sixth and seventh ribs are significantly displaced. Multifocal fractures in the fifth and sixth ribs are noted posteriorly. No underlying pneumothorax is seen. Some subcutaneous emphysema is noted. Increased basilar opacity is noted on the right likely related to contusion. No other focal abnormality is noted. IMPRESSION: Multiple right-sided rib fractures with underlying contusion. No definitive pneumothorax is seen although some subcutaneous emphysema is noted. Electronically Signed   By: Alcide Clever M.D.   On: 08/31/2020 21:52   CT CHEST WO CONTRAST  Result Date: 08/31/2020 CLINICAL DATA:  Recent fall with multiple right-sided rib fractures, initial encounter EXAM: CT CHEST WITHOUT CONTRAST TECHNIQUE: Multidetector CT imaging of the chest was performed following the standard protocol without IV contrast. COMPARISON:  Rib series obtained earlier in the same day FINDINGS: Cardiovascular: Significantly limited due to lack of IV contrast. Diffuse atherosclerotic calcifications are noted in the aorta. No aneurysmal dilatation is seen. No cardiac enlargement is noted. Mild coronary calcifications are seen. Mediastinum/Nodes: Thoracic inlet is within normal limits. No  sizable hilar or mediastinal adenopathy is noted. The esophagus as visualized is within normal limits. Lungs/Pleura: Lungs are well aerated bilaterally. Left lung shows some minimal subpleural fibrotic changes in the lower lobe. Right lung demonstrates mild lower lobe atelectasis and a small effusion. Tiny lateral pneumothorax is seen related to displaced rib fractures. This is better visualized than on prior plain film examination. Associated subcutaneous emphysema is seen. Upper Abdomen: Visualized upper abdomen is unremarkable. Musculoskeletal: Degenerative changes of the thoracic spine are noted. No rib abnormality on the left is seen. Calcified loose bodies are noted in the right shoulder joint. Multiple right-sided rib fractures are noted to include the third, fourth, fifth, sixth and seventh ribs laterally. Posterior rib fractures are noted on the fourth through ninth ribs on the right. The  right fifth, sixth and seventh rib fractures laterally are significantly displaced creating the small pneumothorax on the right. No other rib fractures are seen. IMPRESSION: Multiple right-sided rib fractures as described with associated small right-sided pneumothorax and mild subcutaneous emphysema. Mild associated atelectasis and effusion is noted on the right. No other acute abnormality is noted. Aortic Atherosclerosis (ICD10-I70.0). Electronically Signed   By: Alcide CleverMark  Lukens M.D.   On: 08/31/2020 23:53      Subjective: Pt seen with daughter at bedside today.  Pt reports pain controlled.  She seems much less drowsy today and less confused although mildly.  No acute complaints.  Happy to go home today.   Discharge Exam: Vitals:   09/03/20 2031 09/04/20 0500  BP: (!) 158/72 (!) 162/68  Pulse: 63 70  Resp: 18 18  Temp: 97.9 F (36.6 C) 97.6 F (36.4 C)  SpO2: 90% 96%   Vitals:   09/03/20 0300 09/03/20 1555 09/03/20 2031 09/04/20 0500  BP: 138/66 (!) 109/93 (!) 158/72 (!) 162/68  Pulse: 66 67 63 70   Resp: 17 17 18 18   Temp: 98.1 F (36.7 C) 98.6 F (37 C) 97.9 F (36.6 C) 97.6 F (36.4 C)  TempSrc:   Oral Oral  SpO2: 98% 97% 90% 96%  Weight:      Height:        General: Pt is alert, awake, not in acute distress Cardiovascular: RRR, S1/S2 +, no rubs, no gallops Respiratory: CTA bilaterally, no wheezing, no rhonchi Abdominal: Soft, NT, ND, bowel sounds + Extremities: no edema, no cyanosis    The results of significant diagnostics from this hospitalization (including imaging, microbiology, ancillary and laboratory) are listed below for reference.     Microbiology: Recent Results (from the past 240 hour(s))  SARS CORONAVIRUS 2 (TAT 6-24 HRS) Nasopharyngeal Nasopharyngeal Swab     Status: None   Collection Time: 08/31/20 10:00 PM   Specimen: Nasopharyngeal Swab  Result Value Ref Range Status   SARS Coronavirus 2 NEGATIVE NEGATIVE Final    Comment: (NOTE) SARS-CoV-2 target nucleic acids are NOT DETECTED.  The SARS-CoV-2 RNA is generally detectable in upper and lower respiratory specimens during the acute phase of infection. Negative results do not preclude SARS-CoV-2 infection, do not rule out co-infections with other pathogens, and should not be used as the sole basis for treatment or other patient management decisions. Negative results must be combined with clinical observations, patient history, and epidemiological information. The expected result is Negative.  Fact Sheet for Patients: HairSlick.nohttps://www.fda.gov/media/138098/download  Fact Sheet for Healthcare Providers: quierodirigir.comhttps://www.fda.gov/media/138095/download  This test is not yet approved or cleared by the [place] FDA and  has been authorized for detection and/or diagnosis of SARS-CoV-2 by FDA under an Emergency Use Authorization (EUA). This EUA will remain  in effect (meaning this test can be used) for the duration of the COVID-19 declaration under Se ction 564(b)(1) of the Act, 21 U.S.C. section  360bbb-3(b)(1), unless the authorization is terminated or revoked sooner.  Performed at [facility], [address] [place], [place] [address]      Labs: BNP (last 3 results) No results for input(s): BNP in the last 8760 hours. Basic Metabolic Panel: Recent Labs  Lab 08/31/20 2200  NA 141  K 3.7  CL 109  CO2 29  GLUCOSE 125*  BUN 18  CREATININE 0.94  CALCIUM 9.1   Liver Function Tests: No results for input(s): AST, ALT, ALKPHOS, BILITOT, PROT, ALBUMIN in the last 168 hours. No results for  input(s): LIPASE, AMYLASE in the last 168 hours. No results for input(s): AMMONIA in the last 168 hours. CBC: Recent Labs  Lab 08/31/20 2200 09/01/20 0539 09/03/20 0354  WBC 11.1* 6.8 8.4  HGB 12.9 12.0 12.6  HCT 38.5 36.4 37.0  MCV 98.0 97.8 93.9  PLT 213 115* 181   Cardiac Enzymes: No results for input(s): CKTOTAL, CKMB, CKMBINDEX, TROPONINI in the last 168 hours. BNP: Invalid input(s): POCBNP CBG: No results for input(s): GLUCAP in the last 168 hours. D-Dimer No results for input(s): DDIMER in the last 72 hours. Hgb A1c No results for input(s): HGBA1C in the last 72 hours. Lipid Profile No results for input(s): CHOL, HDL, LDLCALC, TRIG, CHOLHDL, LDLDIRECT in the last 72 hours. Thyroid function studies No results for input(s): TSH, T4TOTAL, T3FREE, THYROIDAB in the last 72 hours.  Invalid input(s): FREET3 Anemia work up No results for input(s): VITAMINB12, FOLATE, FERRITIN, TIBC, IRON, RETICCTPCT in the last 72 hours. Urinalysis No results found for: COLORURINE, APPEARANCEUR, LABSPEC, PHURINE, GLUCOSEU, HGBUR, BILIRUBINUR, KETONESUR, PROTEINUR, UROBILINOGEN, NITRITE, LEUKOCYTESUR Sepsis Labs Invalid input(s): PROCALCITONIN,  WBC,  LACTICIDVEN Microbiology Recent Results (from the past 240 hour(s))  SARS CORONAVIRUS 2 (TAT 6-24 HRS) Nasopharyngeal Nasopharyngeal Swab     Status: None   Collection Time: 08/31/20 10:00 PM   Specimen: Nasopharyngeal Swab   Result Value Ref Range Status   SARS Coronavirus 2 NEGATIVE NEGATIVE Final    Comment: (NOTE) SARS-CoV-2 target nucleic acids are NOT DETECTED.  The SARS-CoV-2 RNA is generally detectable in upper and lower respiratory specimens during the acute phase of infection. Negative results do not preclude SARS-CoV-2 infection, do not rule out co-infections with other pathogens, and should not be used as the sole basis for treatment or other patient management decisions. Negative results must be combined with clinical observations, patient history, and epidemiological information. The expected result is Negative.  Fact Sheet for Patients: HairSlick.no  Fact Sheet for Healthcare Providers: quierodirigir.com  This test is not yet approved or cleared by the [place] FDA and  has been authorized for detection and/or diagnosis of SARS-CoV-2 by FDA under an Emergency Use Authorization (EUA). This EUA will remain  in effect (meaning this test can be used) for the duration of the COVID-19 declaration under Se ction 564(b)(1) of the Act, 21 U.S.C. section 360bbb-3(b)(1), unless the authorization is terminated or revoked sooner.  Performed at [facility], [address], [place], [place] [address]      Time coordinating discharge: Over 30 minutes  SIGNED:   Pennie Banter, DO [facility] 09/04/2020, 11:52 AM   If 7PM-7AM, please contact night-coverage [email or web address]

## 2020-09-04 NOTE — Progress Notes (Signed)
Physical Therapy Treatment Patient Details Name: Andrea Martin MRN: [id number] DOB: 03-19-1935 Today's Date: 09/04/2020    History of Present Illness 85 y.o. female presenting to ED 7/9 s/p mechanical fall with R-sided rib pain. Patient hypoxic in ED. CT chest (+) multiple R-sided rib fractures with associated small R-sided pneumothorax and mild subcutaneous emphysema. Imaging also (+) for mild atelectasis and effusion on R. PMHx significant for recurent falls with injury, HTN, HLD, neuropathy and anxiety/depression.    PT Comments    Pt's daughter present during today's session. Pt showed improved orientation and decreased confusion. Pt stated pain around ribcage is unchanged. Pt ambulated 142ft with min guard before needing a seated rest break. Pt demonstrated improved response to one-step commands during session, requiring less verbal cueing to lift head and guidance of RW. Pt's daughter educated on bed mobility, ambulation, and stair navigation. Daughter mentioned needing noted equipment at home for pt's safety. Issued gait belt and educated daughter on usage.   Follow Up Recommendations  Home health PT     Equipment Recommendations  Rolling walker with 5" wheels;3in1 (PT)    Recommendations for Other Services       Precautions / Restrictions Precautions Precautions: Fall Restrictions Weight Bearing Restrictions: No    Mobility  Bed Mobility   Bed Mobility: Sit to Supine       Sit to supine: Min assist   General bed mobility comments: Pt in recliner prior to PT session. HR 91bpm at rest. Pt's daughter educated on bed mobility. Min A with UE and trunk to sidelying. Pt's vitals at end of session - HR 95bpm    Transfers Overall transfer level: Needs assistance Equipment used: Rolling walker (2 wheeled) Transfers: Sit to/from Stand Sit to Stand: Min assist;+2 physical assistance         General transfer comment: Pt performed sit to stand from recliner. Required min A  +2 physical assistance to progress into standing. Sit to stand took multiple attempts, as pt experienced sharp pain in mentioned areas. Pt and pt's daughter educated on multiple hand placements for transfer.  Ambulation/Gait Ambulation/Gait assistance: Min guard Gait Distance (Feet): 150 Feet Assistive device: Rolling walker (2 wheeled) Gait Pattern/deviations: Step-through pattern   Gait velocity interpretation: 1.31 - 2.62 ft/sec, indicative of limited community ambulator General Gait Details: Pt ambulated 113ft to end of hallway prior to needing a seated rest break. Pt stated she was feeling fatigued. Pt's daughter educated on guarding and guidance of RW during ambulation and practiced with pt. Verbal cueing provided to lift pt's head.   Stairs Stairs: Yes Stairs assistance: Min guard Stair Management: Two rails Number of Stairs: 2 General stair comments: Pt wheeled to rehab gym for stair navigation. Pt ascended and descended 2 stairs with min guard using bilateral rails, mimicking steps at home. Pt's daughter educated on guarding and stair mechanics.   Wheelchair Mobility    Modified Rankin (Stroke Patients Only)       Balance                                            Cognition Arousal/Alertness: Awake/alert Behavior During Therapy: WFL for tasks assessed/performed Overall Cognitive Status: Within Functional Limits for tasks assessed  General Comments: Pt better oriented during today's session with daughter present. Pt followed one-step commands and had greater difficulty responding to multi-step commands. Mentioned having trouble doing all those steps at a time.      Exercises      General Comments        Pertinent Vitals/Pain Vitals stable throughout. Faces Pain Scale: Hurts even more Pain Location: Pt pointed to multiple areas around ribcage - R thoracic spine (approximately ribs 8-10); R flank  (ribs 5-6) Pain Descriptors / Indicators: Sharp Pain Intervention(s): Monitored during session;Repositioned;RN gave pain meds during session    Home Living               Home Equipment: Walker - 4 wheels      Prior Function            PT Goals (current goals can now be found in the care plan section)      Frequency    Min 3X/week      PT Plan Current plan remains appropriate    Co-evaluation              AM-PAC PT "6 Clicks" Mobility   Outcome Measure  Help needed turning from your back to your side while in a flat bed without using bedrails?: A Little Help needed moving from lying on your back to sitting on the side of a flat bed without using bedrails?: A Little Help needed moving to and from a bed to a chair (including a wheelchair)?: A Little Help needed standing up from a chair using your arms (e.g., wheelchair or bedside chair)?: A Little Help needed to walk in hospital room?: A Little Help needed climbing 3-5 steps with a railing? : A Little 6 Click Score: 18    End of Session Equipment Utilized During Treatment: Gait belt Activity Tolerance: Patient tolerated treatment well Patient left: in bed;with call bell/phone within reach;with family/visitor present;with bed alarm set Nurse Communication: Mobility status PT Visit Diagnosis: Unsteadiness on feet (R26.81);Other abnormalities of gait and mobility (R26.89);Repeated falls (R29.6);Muscle weakness (generalized) (M62.81);History of falling (Z91.81);Pain Pain - Right/Left: Right     Time: 1009-1106 PT Time Calculation (min) (ACUTE ONLY): 57 min  Charges:  $Gait Training: 23-37 mins $Therapeutic Activity: 8-22 mins $Self Care/Home Management: 8-22                     Velda Shell, SPT Acute Rehab: [phone]    Vance Gather 09/04/2020, 1:38 PM

## 2020-09-04 NOTE — Care Management Important Message (Signed)
Important Message  Patient Details  Name: Andrea Martin MRN: [id number] Date of Birth: 01/11/1936   Medicare Important Message Given:  Yes     Pio Eatherly Stefan Church 09/04/2020, 3:23 PM

## 2020-09-05 ENCOUNTER — Other Ambulatory Visit (HOSPITAL_BASED_OUTPATIENT_CLINIC_OR_DEPARTMENT_OTHER): Payer: Self-pay

## 2020-09-06 ENCOUNTER — Telehealth: Payer: Self-pay

## 2020-09-06 ENCOUNTER — Other Ambulatory Visit (HOSPITAL_BASED_OUTPATIENT_CLINIC_OR_DEPARTMENT_OTHER): Payer: Self-pay

## 2020-09-06 NOTE — Telephone Encounter (Signed)
Transition Care Management Unsuccessful Follow-up Telephone Call  Date of discharge and from where:  09/04/20 from [facility]  Attempts:  1st Attempt  Reason for unsuccessful TCM follow-up call:  Left voice message

## 2020-09-09 ENCOUNTER — Telehealth: Payer: Self-pay | Admitting: Internal Medicine

## 2020-09-09 ENCOUNTER — Other Ambulatory Visit (HOSPITAL_BASED_OUTPATIENT_CLINIC_OR_DEPARTMENT_OTHER): Payer: Self-pay

## 2020-09-09 NOTE — Telephone Encounter (Signed)
Hospital follow up scheduled 09/11/20 @ 11:20.

## 2020-09-09 NOTE — Telephone Encounter (Signed)
Transition Care Management Unsuccessful Follow-up Telephone Call  Date of discharge and from where:  09/04/20 from [facility]  Attempts:  2nd Attempt  Reason for unsuccessful TCM follow-up call:  Left voice message

## 2020-09-09 NOTE — Telephone Encounter (Signed)
error 

## 2020-09-10 ENCOUNTER — Other Ambulatory Visit (HOSPITAL_BASED_OUTPATIENT_CLINIC_OR_DEPARTMENT_OTHER): Payer: Self-pay

## 2020-09-10 DIAGNOSIS — Z9181 History of falling: Secondary | ICD-10-CM | POA: Diagnosis not present

## 2020-09-10 DIAGNOSIS — S2241XD Multiple fractures of ribs, right side, subsequent encounter for fracture with routine healing: Secondary | ICD-10-CM | POA: Diagnosis not present

## 2020-09-10 DIAGNOSIS — I10 Essential (primary) hypertension: Secondary | ICD-10-CM | POA: Diagnosis not present

## 2020-09-10 DIAGNOSIS — T797XXD Traumatic subcutaneous emphysema, subsequent encounter: Secondary | ICD-10-CM | POA: Diagnosis not present

## 2020-09-10 DIAGNOSIS — G629 Polyneuropathy, unspecified: Secondary | ICD-10-CM | POA: Diagnosis not present

## 2020-09-10 DIAGNOSIS — F32A Depression, unspecified: Secondary | ICD-10-CM | POA: Diagnosis not present

## 2020-09-10 DIAGNOSIS — S270XXD Traumatic pneumothorax, subsequent encounter: Secondary | ICD-10-CM | POA: Diagnosis not present

## 2020-09-10 DIAGNOSIS — E785 Hyperlipidemia, unspecified: Secondary | ICD-10-CM | POA: Diagnosis not present

## 2020-09-10 NOTE — Telephone Encounter (Signed)
Transition Care Management Unsuccessful Follow-up Telephone Call  Date of discharge and from where:  09/04/20 from [facility]  Attempts:  3rd Attempt  Reason for unsuccessful TCM follow-up call:  Left voice message. Keep HFU appt scheduled 09/11/20 @ 11:20.

## 2020-09-11 ENCOUNTER — Inpatient Hospital Stay: Payer: Medicare Other | Admitting: Internal Medicine

## 2020-09-11 ENCOUNTER — Telehealth: Payer: Self-pay

## 2020-09-11 NOTE — Telephone Encounter (Signed)
Spoke w/ Berline Lopes- verbal orders given.

## 2020-09-11 NOTE — Telephone Encounter (Signed)
[facility] called asking for verbal orders for Home Health and PT  1 time week for 1 week 2 times a week for 4 weeks 1 time a week for 4 weeks   Gracie's callback number: [phone] Cracie

## 2020-09-13 ENCOUNTER — Telehealth: Payer: Self-pay

## 2020-09-13 ENCOUNTER — Other Ambulatory Visit (HOSPITAL_BASED_OUTPATIENT_CLINIC_OR_DEPARTMENT_OTHER): Payer: Self-pay

## 2020-09-13 DIAGNOSIS — T797XXD Traumatic subcutaneous emphysema, subsequent encounter: Secondary | ICD-10-CM | POA: Diagnosis not present

## 2020-09-13 DIAGNOSIS — E785 Hyperlipidemia, unspecified: Secondary | ICD-10-CM | POA: Diagnosis not present

## 2020-09-13 DIAGNOSIS — Z9181 History of falling: Secondary | ICD-10-CM | POA: Diagnosis not present

## 2020-09-13 DIAGNOSIS — F32A Depression, unspecified: Secondary | ICD-10-CM | POA: Diagnosis not present

## 2020-09-13 DIAGNOSIS — S2241XD Multiple fractures of ribs, right side, subsequent encounter for fracture with routine healing: Secondary | ICD-10-CM | POA: Diagnosis not present

## 2020-09-13 DIAGNOSIS — I10 Essential (primary) hypertension: Secondary | ICD-10-CM | POA: Diagnosis not present

## 2020-09-13 DIAGNOSIS — F419 Anxiety disorder, unspecified: Secondary | ICD-10-CM

## 2020-09-13 DIAGNOSIS — G629 Polyneuropathy, unspecified: Secondary | ICD-10-CM | POA: Diagnosis not present

## 2020-09-13 DIAGNOSIS — S270XXD Traumatic pneumothorax, subsequent encounter: Secondary | ICD-10-CM | POA: Diagnosis not present

## 2020-09-13 NOTE — Telephone Encounter (Signed)
Plan of care signed and faxed to [facility] at [phone]. Form sent to scanning.

## 2020-09-14 ENCOUNTER — Encounter (HOSPITAL_BASED_OUTPATIENT_CLINIC_OR_DEPARTMENT_OTHER): Payer: Self-pay | Admitting: Urology

## 2020-09-14 ENCOUNTER — Other Ambulatory Visit: Payer: Self-pay

## 2020-09-14 ENCOUNTER — Emergency Department (HOSPITAL_BASED_OUTPATIENT_CLINIC_OR_DEPARTMENT_OTHER): Payer: Medicare Other

## 2020-09-14 ENCOUNTER — Emergency Department (HOSPITAL_BASED_OUTPATIENT_CLINIC_OR_DEPARTMENT_OTHER)
Admission: EM | Admit: 2020-09-14 | Discharge: 2020-09-14 | Disposition: A | Discharge status: Home / Self Care | Payer: Medicare Other | Attending: Emergency Medicine | Admitting: Emergency Medicine

## 2020-09-14 DIAGNOSIS — M546 Pain in thoracic spine: Secondary | ICD-10-CM | POA: Diagnosis not present

## 2020-09-14 DIAGNOSIS — K824 Cholesterolosis of gallbladder: Secondary | ICD-10-CM | POA: Diagnosis not present

## 2020-09-14 DIAGNOSIS — Z20822 Contact with and (suspected) exposure to covid-19: Secondary | ICD-10-CM | POA: Diagnosis not present

## 2020-09-14 DIAGNOSIS — R319 Hematuria, unspecified: Secondary | ICD-10-CM

## 2020-09-14 DIAGNOSIS — Z79899 Other long term (current) drug therapy: Secondary | ICD-10-CM | POA: Insufficient documentation

## 2020-09-14 DIAGNOSIS — R10811 Right upper quadrant abdominal tenderness: Secondary | ICD-10-CM

## 2020-09-14 DIAGNOSIS — R10819 Abdominal tenderness, unspecified site: Secondary | ICD-10-CM | POA: Insufficient documentation

## 2020-09-14 DIAGNOSIS — N10 Acute tubulo-interstitial nephritis: Secondary | ICD-10-CM | POA: Diagnosis not present

## 2020-09-14 DIAGNOSIS — E876 Hypokalemia: Secondary | ICD-10-CM

## 2020-09-14 DIAGNOSIS — I10 Essential (primary) hypertension: Secondary | ICD-10-CM | POA: Diagnosis not present

## 2020-09-14 DIAGNOSIS — M545 Low back pain, unspecified: Secondary | ICD-10-CM | POA: Diagnosis not present

## 2020-09-14 LAB — URINALYSIS, ROUTINE W REFLEX MICROSCOPIC
Bilirubin Urine: NEGATIVE
Glucose, UA: NEGATIVE mg/dL
Ketones, ur: NEGATIVE mg/dL
Nitrite: NEGATIVE
Protein, ur: NEGATIVE mg/dL
Specific Gravity, Urine: 1.023 (ref 1.005–1.030)
pH: 7 (ref 5.0–8.0)

## 2020-09-14 LAB — COMPREHENSIVE METABOLIC PANEL
ALT: 22 U/L (ref 0–44)
AST: 19 U/L (ref 15–41)
Albumin: 3.5 g/dL (ref 3.5–5.0)
Alkaline Phosphatase: 147 U/L — ABNORMAL HIGH (ref 38–126)
Anion gap: 10 (ref 5–15)
BUN: 10 mg/dL (ref 8–23)
CO2: 27 mmol/L (ref 22–32)
Calcium: 9 mg/dL (ref 8.9–10.3)
Chloride: 105 mmol/L (ref 98–111)
Creatinine, Ser: 0.75 mg/dL (ref 0.44–1.00)
GFR, Estimated: >60 mL/min (ref >60)
Glucose, Bld: 105 mg/dL — ABNORMAL HIGH (ref 70–99)
Potassium: 2.8 mmol/L — ABNORMAL LOW (ref 3.5–5.1)
Sodium: 142 mmol/L (ref 135–145)
Total Bilirubin: 0.4 mg/dL (ref 0.3–1.2)
Total Protein: 6.2 g/dL — ABNORMAL LOW (ref 6.5–8.1)

## 2020-09-14 LAB — LIPASE, BLOOD: Lipase: 41 U/L (ref 11–51)

## 2020-09-14 LAB — CBC WITH DIFFERENTIAL/PLATELET
Abs Immature Granulocytes: 0.01 10*3/uL (ref 0.00–0.07)
Basophils Absolute: 0 10*3/uL (ref 0.0–0.1)
Basophils Relative: 1 %
Eosinophils Absolute: 0.2 10*3/uL (ref 0.0–0.5)
Eosinophils Relative: 3 %
HCT: 41 % (ref 36.0–46.0)
Hemoglobin: 14.1 g/dL (ref 12.0–15.0)
Immature Granulocytes: 0 %
Lymphocytes Relative: 27 %
Lymphs Abs: 1.7 10*3/uL (ref 0.7–4.0)
MCH: 32.6 pg (ref 26.0–34.0)
MCHC: 34.4 g/dL (ref 30.0–36.0)
MCV: 94.7 fL (ref 80.0–100.0)
Monocytes Absolute: 0.6 10*3/uL (ref 0.1–1.0)
Monocytes Relative: 10 %
Neutro Abs: 3.9 10*3/uL (ref 1.7–7.7)
Neutrophils Relative %: 59 %
Platelets: 466 10*3/uL — ABNORMAL HIGH (ref 150–400)
RBC: 4.33 MIL/uL (ref 3.87–5.11)
RDW: 12.6 % (ref 11.5–15.5)
WBC: 6.5 10*3/uL (ref 4.0–10.5)
nRBC: 0 % (ref 0.0–0.2)

## 2020-09-14 LAB — MAGNESIUM: Magnesium: 2 mg/dL (ref 1.7–2.4)

## 2020-09-14 LAB — RESP PANEL BY RT-PCR (FLU A&B, COVID) ARPGX2
Influenza A by PCR: NEGATIVE
Influenza B by PCR: NEGATIVE
SARS Coronavirus 2 by RT PCR: NEGATIVE

## 2020-09-14 LAB — LACTIC ACID, PLASMA: Lactic Acid, Venous: 1.2 mmol/L (ref 0.5–1.9)

## 2020-09-14 MED ORDER — POTASSIUM CHLORIDE CRYS ER 20 MEQ PO TBCR: 20.0000 meq | EXTENDED_RELEASE_TABLET | Freq: Two times a day (BID) | ORAL | 0 refills | Status: DC

## 2020-09-14 MED ORDER — POTASSIUM CHLORIDE 10 MEQ/100ML IV SOLN
10.0000 meq | INTRAVENOUS | Status: AC
  Administered 2020-09-14 (×2): 10 meq via INTRAVENOUS
  Filled 2020-09-14 (×2): qty 100

## 2020-09-14 MED ORDER — POTASSIUM CHLORIDE CRYS ER 20 MEQ PO TBCR
40.0000 meq | EXTENDED_RELEASE_TABLET | Freq: Once | ORAL | Status: AC
  Administered 2020-09-14: 40 meq via ORAL
  Filled 2020-09-14: qty 2

## 2020-09-14 MED ORDER — IOHEXOL 350 MG/ML SOLN
100.0000 mL | Freq: Once | INTRAVENOUS | Status: AC | PRN
  Administered 2020-09-14: 80 mL via INTRAVENOUS

## 2020-09-14 MED ORDER — CEPHALEXIN 500 MG PO CAPS: 500.0000 mg | ORAL_CAPSULE | Freq: Three times a day (TID) | ORAL | 0 refills | Status: AC

## 2020-09-14 MED ORDER — SODIUM CHLORIDE 0.9 % IV BOLUS
1000.0000 mL | Freq: Once | INTRAVENOUS | Status: AC
  Administered 2020-09-14: 1000 mL via INTRAVENOUS

## 2020-09-14 NOTE — ED Notes (Signed)
Pt verbalizes understanding of discharge instructions. Opportunity for questioning and answers were provided. Armand removed by staff, pt discharged from ED to home. Educated to pick up Rx.  

## 2020-09-14 NOTE — ED Provider Notes (Signed)
[facility] EMERGENCY DEPT Provider Note   CSN: [id number] Arrival date & time: 09/14/20  1327     History Chief Complaint  Patient presents with   Hematuria    Andrea Martin is a 85 y.o. female.  Patient arrives after having some blood in her urine this morning.  Passed a blood clot but has now resolved.  Not on blood thinners.  Had a recent fall several weeks ago and broke right-sided ribs.  Had a small pneumothorax.  Denies any new falls.  Has had some lower abdominal pressure.  No history of smoking.  No history of kidney stones.  Daughter states that she has been a little bit more confused this morning.  Denies any fevers or chills.  Has been taking Ativan.  The history is provided by the patient.  Hematuria This is a new problem. The problem has been resolved. Pertinent negatives include no chest pain, no abdominal pain, no headaches and no shortness of breath. Nothing aggravates the symptoms. Nothing relieves the symptoms. She has tried nothing for the symptoms. The treatment provided no relief.      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     Family History  Problem Relation Age of Onset   Heart disease Father        CAD?   Coronary artery disease Brother 50   Heart disease Mother    Breast cancer Neg Hx    Colon cancer Neg Hx    Diabetes Neg Hx    Stroke Neg Hx     Social History   Tobacco Use   Smoking status: Never   Smokeless tobacco: Never  Vaping Use   Vaping Use: Never used  Substance Use Topics   Alcohol use: No   Drug use: No    Home Medications Prior to Admission medications   Medication Sig Start Date End Date Taking? Authorizing Provider  acetaminophen (TYLENOL) 500 MG tablet Take 2 tablets (1,000 mg total) by mouth every 6 (six) hours. 09/04/20   Pennie Banter, DO  ALPRAZolam Prudy Feeler) 0.5 MG tablet TAKE 1/2 - 1 TABLET BY MOUTH 3 TIMES DAILY AS NEEDED FOR ANXIETY 08/06/20 02/02/21  Wanda Plump, MD  azelastine (ASTELIN) 0.1 % nasal spray Place 2 sprays into both nostrils at bedtime as needed for rhinitis. Use in each nostril as directed 12/15/17   Wanda Plump, MD  buPROPion [facility] SR) 150 MG 12 hr tablet Take  2 tablets (300 mg total) by mouth daily. 05/06/20   Wanda Plump, MD  Calcium Carb-Cholecalciferol (CALCIUM 600+D3) 600-800 MG-UNIT TABS Take 1 tablet by mouth every morning.    [provider]  Cholecalciferol (VITAMIN D3) 50 MCG (2000 UT) TABS Take 2,000 Units by mouth every morning.    [provider]  diclofenac (FLECTOR) 1.3 % PTCH Place 1 patch onto the skin 2 (two) times daily. 09/04/20   Esaw Grandchild A, DO  escitalopram (LEXAPRO) 20 MG tablet Take 1 tablet (20 mg total) by mouth daily. 05/06/20   Wanda Plump, MD  GLUCOSAMINE-CHONDROITIN PO Take 1 tablet by mouth every morning. Glucosamine 1500 mg, chondroitin 1200 mg    [provider]  losartan (COZAAR) 50 MG tablet Take 1 tablet (50 mg total) by mouth daily. 08/27/20   Wanda Plump, MD   meclizine (ANTIVERT) 12.5 MG tablet Take 1 tablet (12.5 mg total) by mouth 3 (three) times daily as needed for dizziness. 11/03/19   Saguier, Ramon Dredge, PA-C  methocarbamol (ROBAXIN) 500 MG tablet Take 2 tablets (1,000 mg total) by mouth every 6 (six) hours as needed for muscle spasms. 09/04/20   Pennie Banter, DO  Multiple Vitamin (MULTIVITAMIN WITH MINERALS) TABS tablet Take 1 tablet by mouth every morning.    [provider]  Omega-3 Fatty Acids (FISH OIL) 600 MG CAPS Take 600 mg by mouth every morning.    [provider]  polyethylene glycol powder (GLYCOLAX/MIRALAX) 17 GM/SCOOP powder Mix 1 capful in 8 oz of liquid and drink everyday 09/05/20   Esaw Grandchild A, DO  pravastatin (PRAVACHOL) 20 MG tablet Take 1 tablet (20 mg total) by mouth at bedtime. 08/27/20   Wanda Plump, MD  senna-docusate (SENOKOT-S) 8.6-50 MG tablet Take 1 tablet by mouth 2 (two) times daily. 09/04/20   Pennie Banter, DO    Allergies    Codeine, Sulfa antibiotics, and Penicillins  Review of Systems   Review of Systems  Constitutional:  Negative for chills and fever.  HENT:  Negative for ear pain and sore throat.   Eyes:  Negative for pain and visual disturbance.  Respiratory:  Negative for cough and shortness of breath.   Cardiovascular:  Negative for chest pain and palpitations.  Gastrointestinal:  Negative for abdominal pain and vomiting.  Genitourinary:  Positive for hematuria. Negative for dysuria.  Musculoskeletal:  Positive for back pain. Negative for arthralgias.  Skin:  Negative for color change and rash.  Neurological:  Negative for seizures, syncope and headaches.  All other systems reviewed and are negative.  Physical Exam Updated Vital Signs BP 140/78 (BP Location: Left Arm)   Pulse 91   Temp 100.1 F (37.8 C) (Rectal)   Resp 18   Ht 5\' 5"  (1.651 m)   Wt 74 kg   SpO2 93%   BMI 27.15 kg/m   Physical Exam Vitals and nursing note reviewed.  Constitutional:      General:  She is not in acute distress.    Appearance: She is well-developed. She is not ill-appearing.  HENT:     Head: Normocephalic and atraumatic.     Mouth/Throat:     Mouth: Mucous membranes are moist.  Eyes:     Extraocular Movements: Extraocular movements intact.     Conjunctiva/sclera: Conjunctivae normal.     Pupils: Pupils are equal, round, and reactive to light.  Cardiovascular:     Rate and Rhythm: Normal rate and regular rhythm.  Pulses: Normal pulses.     Heart sounds: Normal heart sounds. No murmur heard. Pulmonary:     Effort: Pulmonary effort is normal. No respiratory distress.     Breath sounds: Normal breath sounds.  Abdominal:     Palpations: Abdomen is soft.     Tenderness: There is abdominal tenderness (suprapubic?).  Musculoskeletal:        General: Tenderness present. Normal range of motion.     Cervical back: Normal range of motion and neck supple.     Comments: Tenderness to the right upper back  Skin:    General: Skin is warm and dry.     Capillary Refill: Capillary refill takes less than 2 seconds.  Neurological:     General: No focal deficit present.     Mental Status: She is alert.    ED Results / Procedures / Treatments   Labs (all labs ordered are listed, but only abnormal results are displayed) Labs Reviewed  CBC WITH DIFFERENTIAL/PLATELET - Abnormal; Notable for the following components:      Result Value   Platelets 466 (*)    All other components within normal limits  URINE CULTURE  RESP PANEL BY RT-PCR (FLU A&B, COVID) ARPGX2  URINALYSIS, ROUTINE W REFLEX MICROSCOPIC  COMPREHENSIVE METABOLIC PANEL  LIPASE, BLOOD  LACTIC ACID, PLASMA  LACTIC ACID, PLASMA    EKG None  Radiology No results found.  Procedures Procedures   Medications Ordered in ED Medications  sodium chloride 0.9 % bolus 1,000 mL (1,000 mLs Intravenous New Bag/Given 09/14/20 1453)    ED Course  I have reviewed the triage vital signs and the nursing  notes.  Pertinent labs & imaging results that were available during my care of the patient were reviewed by me and considered in my medical decision making (see chart for details).    MDM Rules/Calculators/A&P                           Andrea Martin is here with hematuria, possible concern for infection.  Overall normal vitals except for maybe a low-grade fever of 100.1.  Recently suffered right rib fractures from a fall 2 weeks ago.  Has ongoing right-sided pain.  Daughter states that she is on Ativan and could be why she is little bit weak and confused.  She has no real abdominal pain but noticed some blood in her urine today.  Thought there was a blood clot that passed but may be has now resolved.  She denies any symptoms of urinary retention.  She is not on blood thinners.  Concern for possible urinary tract infection.  However given recent fall may be some sort of delayed injury.  We will get infectious lab work as well as CT scan abdomen pelvis.  Will get chest x-ray.  Signed out to oncoming ED staff with patient pending lab work and imaging and reevaluation.  Please see their note for further results, evaluation, disposition of the patient.  This chart was dictated using voice recognition software.  Despite best efforts to proofread,  errors can occur which can change the documentation meaning.   Final Clinical Impression(s) / ED Diagnoses Final diagnoses:  Hematuria, unspecified type    Rx / DC Orders ED Discharge Orders     None        Virgina Norfolk, DO 09/14/20 1509

## 2020-09-14 NOTE — ED Triage Notes (Signed)
States passed blood clot in urine yesterday, states lower back pain, denies dysuria.

## 2020-09-14 NOTE — ED Provider Notes (Signed)
  Physical Exam  BP (!) 181/65 (BP Location: Right Arm)   Pulse 81   Temp 100.1 F (37.8 C) (Rectal)   Resp 16   Ht 5\' 5"  (1.651 m)   Wt 74 kg   SpO2 100%   BMI 27.15 kg/m   Physical Exam  ED Course/Procedures     Procedures  MDM  Received care of patient from Dr. . Please see his note for prior history, physical and care. Briefly, this is an 85yo female who recently had a fall with rib fractures who presented with concern for hematuria, elevated temperature and continued right sided pain.  CT, labs pending as well as UA.   Labs show hypokalemia without other significant findings. No leukocytosis, normal lactic acid. COVID testing negative.   CT shows partially imaged known rib fractures, right pleural effusion, gallbladder wall mildly thickened. 85yo completed shows mild thickening of GB< no stones, no convincing cholecystitis. Has some RUQ tenderness but negative Murphy's sign and possible tenderness is secondary to rib fracture. GIven no stones, no significant findings on [place] and other explanations for pain do not suspect cholecystitis. UA with WBC, will treat for UTI. At this time feel this is post likely etiology of the hematuria an elevated temperature.  Recommend PCP follow up and discussed strict return precautions. Patient discharged in stable condition with understanding of reasons to return.           [place], MD 09/16/20 1450

## 2020-09-16 ENCOUNTER — Ambulatory Visit (INDEPENDENT_AMBULATORY_CARE_PROVIDER_SITE_OTHER): Payer: Medicare Other | Admitting: Internal Medicine

## 2020-09-16 ENCOUNTER — Other Ambulatory Visit (HOSPITAL_BASED_OUTPATIENT_CLINIC_OR_DEPARTMENT_OTHER): Payer: Self-pay

## 2020-09-16 ENCOUNTER — Telehealth: Payer: Self-pay | Admitting: *Deleted

## 2020-09-16 ENCOUNTER — Other Ambulatory Visit: Payer: Self-pay

## 2020-09-16 VITALS — BP 135/52 | HR 94 | Temp 99.1°F | Resp 16 | Ht 66.0 in | Wt 149.0 lb

## 2020-09-16 DIAGNOSIS — R310 Gross hematuria: Secondary | ICD-10-CM

## 2020-09-16 DIAGNOSIS — S2241XD Multiple fractures of ribs, right side, subsequent encounter for fracture with routine healing: Secondary | ICD-10-CM

## 2020-09-16 DIAGNOSIS — E876 Hypokalemia: Secondary | ICD-10-CM | POA: Diagnosis not present

## 2020-09-16 LAB — URINE CULTURE

## 2020-09-16 NOTE — Patient Instructions (Addendum)
Finish the potassium tablets they prescribed to you at  the emergency room  Try to minimize the use of the following medications to prevent confusion: Alprazolam, Robaxin, meclizine.  Call if not gradually better  Finish the antibiotics as prescribed  Arrange for a lab appointment in 1 week to recheck your potassium  Please reach out if you have fever, chills, blood in the urine again, increase confusion.

## 2020-09-16 NOTE — Telephone Encounter (Signed)
Who Is Calling Patient / Member / Family / Caregiver Call Type Triage / Clinical Caller Name Beth Callicutt Relationship To Patient Daughter Return Phone Number [phone] (Primary) Chief Complaint Urine, Blood In Reason for Call Symptomatic / Request for Health Information Initial Comment Caller states her mom has blood in her urine. GOTO Facility Not Listed [facility] No Nurse Assessment Nurse: Rennis Petty, RN, Clydie Braun Date/Time Lamount Cohen Time): 09/14/2020 10:29:05 AM Confirm and document reason for call. If symptomatic, describe symptoms. ---Caller states her mom has blood in her urine. There was a big clot. She is having frequency and some burning with urination. Afebrile.  09/14/2020 10:31:52 AM Go to ED Now Yes Rennis Petty, RN, Clydie Braun

## 2020-09-16 NOTE — Telephone Encounter (Signed)
Patient went to ED on 09/14/20 for this matter.

## 2020-09-16 NOTE — Progress Notes (Signed)
Subjective:    Patient ID: Andrea Martin, female    DOB: 11-30-1935, 85 y.o.   MRN: [id number]  DOS:  09/16/2020 Type of visit - description: ED f/u  Went to ER 08/31/2020, admitted, had a mechanical fall, had multiple right-sided rib fractures and a small pneumothorax with hypoxia. Pneumothorax resolved. Pain was under better control. Had transient delirium. Had transient thrombocytopenia. BP was somewhat elevated likely due to pain. Eventually she felt well enough and was discharged home.    Went to the ER 09/14/2020, chief complaint was gross hematuria. She saw some clots in the urine the day prior to the ER visit and few clots on the day of ER eval.  She also have some mild dysuria.  Work-up at the ER: UA +WBCs UCX w/  multiple organisms  potassium 2.8, creatinine 0.7.  CBC normal except for platelets of 466 lipase normal. Respiratory panel negative Chest x-ray multiple right-sided rib fractures  CT chest  1. Partly imaged right-sided rib fractures. 2. Moderate right pleural effusion with associated dependent atelectasis. 3. Gallbladder wall mildly thickened. This is nonspecific, but raises the possibility of acute cholecystitis in the proper clinical setting. If there are consistent clinical findings, follow-up right upper quadrant ultrasound would be recommended to assess for non radiopaque stones. 4. No other evidence of an acute abnormality within the abdomen or pelvis. 5. Aortic atherosclerosis.  [place]: No cholecystitis type of changes  Review of Systems  Since she left the ER, she is taking the potassium and Keflex as prescribed for possible UTI.  She is here with the daughter, prior to go to the ER she had LUTS, now is asymptomatic specifically no gross hematuria.  Urine looks clear. No nausea, vomiting, diarrhea. No fever or chills. Appetite is decreased. R chest wall pain continue, only requiring Tylenol Still have occasional confusion but much less than  before.   Past Medical History:  Diagnosis Date   Anxiety and depression    Gait disorder    History of fractures due to falls 09/01/2020   HTN (hypertension) 09/01/2020   Hyperlipidemia    Neuropathy 08/04/2013   Shoulder dislocation    h/o after a fall    Past Surgical History:  Procedure Laterality Date   NO PAST SURGERIES      Allergies as of 09/16/2020       Reactions   Codeine Nausea And Vomiting   Sulfa Antibiotics Other (See Comments)   Was told allergic to sulfa does not remember why   Penicillins Rash        Medication List        Accurate as of September 16, 2020 11:59 PM. If you have any questions, ask your nurse or doctor.          acetaminophen 500 MG tablet Commonly known as: TYLENOL Take 2 tablets (1,000 mg total) by mouth every 6 (six) hours.   ALPRAZolam 0.5 MG tablet Commonly known as: XANAX TAKE 1/2 - 1 TABLET BY MOUTH 3 TIMES DAILY AS NEEDED FOR ANXIETY   azelastine 0.1 % nasal spray Commonly known as: ASTELIN Place 2 sprays into both nostrils at bedtime as needed for rhinitis. Use in each nostril as directed   buPROPion 150 MG 12 hr tablet Commonly known as: WELLBUTRIN SR Take 2 tablets (300 mg total) by mouth daily.   Calcium 600+D3 600-800 MG-UNIT Tabs Generic drug: Calcium Carb-Cholecalciferol Take 1 tablet by mouth every morning.   cephALEXin 500 MG capsule Commonly known as: KEFLEX  Take 1 capsule (500 mg total) by mouth 3 (three) times daily for 10 days.   diclofenac 1.3 % Ptch Commonly known as: FLECTOR Place 1 patch onto the skin 2 (two) times daily.   escitalopram 20 MG tablet Commonly known as: LEXAPRO Take 1 tablet (20 mg total) by mouth daily.   Fish Oil 600 MG Caps Take 600 mg by mouth every morning.   GLUCOSAMINE-CHONDROITIN PO Take 1 tablet by mouth every morning. Glucosamine 1500 mg, chondroitin 1200 mg   losartan 50 MG tablet Commonly known as: COZAAR Take 1 tablet (50 mg total) by mouth daily.   meclizine  12.5 MG tablet Commonly known as: ANTIVERT Take 1 tablet (12.5 mg total) by mouth 3 (three) times daily as needed for dizziness.   methocarbamol 500 MG tablet Commonly known as: ROBAXIN Take 2 tablets (1,000 mg total) by mouth every 6 (six) hours as needed for muscle spasms.   multivitamin with minerals Tabs tablet Take 1 tablet by mouth every morning.   PEG 3350 17 GM/SCOOP Powd Mix 1 capful in 8 oz of liquid and drink everyday   potassium chloride SA 20 MEQ tablet Commonly known as: KLOR-CON Take 1 tablet (20 mEq total) by mouth 2 (two) times daily for 3 days.   pravastatin 20 MG tablet Commonly known as: PRAVACHOL Take 1 tablet (20 mg total) by mouth at bedtime.   senna-docusate 8.6-50 MG tablet Commonly known as: Senokot-S Take 1 tablet by mouth 2 (two) times daily.   Vitamin D3 50 MCG (2000 UT) Tabs Take 2,000 Units by mouth every morning.           Objective:   Physical Exam BP (!) 135/52 (BP Location: Left Arm, Patient Position: Sitting, Cuff Size: Small)   Pulse 94   Temp 99.1 F (37.3 C) (Oral)   Resp 16   Ht 5\' 6"  (1.676 m)   Wt 149 lb (67.6 kg)   SpO2 99%   BMI 24.05 kg/m  General:   Well developed, NAD, BMI noted. HEENT:  Normocephalic . Face symmetric, atraumatic Lungs:  Decreased breath sounds at right base. Normal respiratory effort, no intercostal retractions, no accessory muscle use. Heart: RRR,  no murmur.  Lower extremities: no pretibial edema bilaterally  Skin: Not pale. Not jaundice Neurologic:  alert & oriented X3.  Speech normal, gait: Not tested Psych--  Cognition and judgment appear intact.  Cooperative with normal attention span and concentration.  Behavior appropriate. No anxious or depressed appearing.      Assessment      Assessment   HTN: DX 10/2019. Hyperlipidemia.  Simvastatin intolerant.  Rx Pravachol 10-2016  Neuropathy: DX 2015, feet numbness, decreased pinprick exam,  declined NCS Tremor, likely essential ; dx  11/2015 ++ s/e w/ primidone (2018) Anxiety depression Gait d/o, falls, referred to PT 2014 Cerebellar: Remote infarct per MRI 10-2018 Osteopenia: T score -1.6 10-2018  PLAN: Mechanical fall, R rib fractures, pneumothorax (resolved), right pleural effusion, delirium, hematuria. Since the last visit with me few weeks ago unfortunately she had a mechanical fall on 08/31/2020, she was found to have multiple right rib fractures, small pneumothorax, hypoxia. She responded well to conservative treatment, pneumothorax resolved. She had transient delirium during the hospital stay. Several days later she noted gross hematuria but she had no fever or chills presented to the ER 09/14/2020. Urinalysis at the emergency room showed WBCs and urine culture showed multiple organisms. She also had hypokalemia and is currently taking potassium supplements. At this point she  seems to be doing okay. I wonder if the gross hematuria was result of the previous injury 08/31/2020. Plan: Finished potassium supplements, BMP in 1 week Continue Keflex for possible UTI, there were some WBCs in the urine. Good hydration Minimize medications that could affect her mentation.  See AVS.    This visit occurred during the SARS-CoV-2 public health emergency.  Safety protocols were in place, including screening questions prior to the visit, additional usage of staff PPE, and extensive cleaning of exam room while observing appropriate contact time as indicated for disinfecting solutions.

## 2020-09-17 NOTE — Assessment & Plan Note (Signed)
  Mechanical fall, R rib fractures, pneumothorax (resolved), right pleural effusion, delirium, hematuria. Since the last visit with me few weeks ago unfortunately she had a mechanical fall on 08/31/2020, she was found to have multiple right rib fractures, small pneumothorax, hypoxia. She responded well to conservative treatment, pneumothorax resolved. She had transient delirium during the hospital stay. Several days later she noted gross hematuria but she had no fever or chills presented to the ER 09/14/2020. Urinalysis at the emergency room showed WBCs and urine culture showed multiple organisms. She also had hypokalemia and is currently taking potassium supplements. At this point she seems to be doing okay. I wonder if the gross hematuria was result of the previous injury 08/31/2020. Plan: Finished potassium supplements, BMP in 1 week Continue Keflex for possible UTI, there were some WBCs in the urine. Good hydration Minimize medications that could affect her mentation.  See AVS.

## 2020-09-18 ENCOUNTER — Other Ambulatory Visit (HOSPITAL_BASED_OUTPATIENT_CLINIC_OR_DEPARTMENT_OTHER): Payer: Self-pay

## 2020-09-18 ENCOUNTER — Encounter: Payer: Self-pay | Admitting: Internal Medicine

## 2020-09-23 ENCOUNTER — Other Ambulatory Visit (INDEPENDENT_AMBULATORY_CARE_PROVIDER_SITE_OTHER): Payer: Medicare Other

## 2020-09-23 ENCOUNTER — Other Ambulatory Visit (HOSPITAL_BASED_OUTPATIENT_CLINIC_OR_DEPARTMENT_OTHER): Payer: Self-pay

## 2020-09-23 ENCOUNTER — Other Ambulatory Visit: Payer: Self-pay

## 2020-09-23 DIAGNOSIS — E876 Hypokalemia: Secondary | ICD-10-CM | POA: Diagnosis not present

## 2020-09-23 LAB — BASIC METABOLIC PANEL
BUN: 12 mg/dL (ref 6–23)
CO2: 24 mEq/L (ref 19–32)
Calcium: 9.5 mg/dL (ref 8.4–10.5)
Chloride: 106 mEq/L (ref 96–112)
Creatinine, Ser: 0.96 mg/dL (ref 0.40–1.20)
GFR: 54.69 mL/min — ABNORMAL LOW (ref >60.00)
Glucose, Bld: 103 mg/dL — ABNORMAL HIGH (ref 70–99)
Potassium: 3.8 mEq/L (ref 3.5–5.1)
Sodium: 140 mEq/L (ref 135–145)

## 2020-09-25 ENCOUNTER — Other Ambulatory Visit (HOSPITAL_BASED_OUTPATIENT_CLINIC_OR_DEPARTMENT_OTHER): Payer: Self-pay

## 2020-09-30 ENCOUNTER — Other Ambulatory Visit (HOSPITAL_BASED_OUTPATIENT_CLINIC_OR_DEPARTMENT_OTHER): Payer: Self-pay

## 2020-10-01 DIAGNOSIS — Z9181 History of falling: Secondary | ICD-10-CM | POA: Diagnosis not present

## 2020-10-01 DIAGNOSIS — T797XXD Traumatic subcutaneous emphysema, subsequent encounter: Secondary | ICD-10-CM | POA: Diagnosis not present

## 2020-10-01 DIAGNOSIS — S2241XD Multiple fractures of ribs, right side, subsequent encounter for fracture with routine healing: Secondary | ICD-10-CM | POA: Diagnosis not present

## 2020-10-01 DIAGNOSIS — F32A Depression, unspecified: Secondary | ICD-10-CM | POA: Diagnosis not present

## 2020-10-01 DIAGNOSIS — G629 Polyneuropathy, unspecified: Secondary | ICD-10-CM | POA: Diagnosis not present

## 2020-10-01 DIAGNOSIS — E785 Hyperlipidemia, unspecified: Secondary | ICD-10-CM | POA: Diagnosis not present

## 2020-10-01 DIAGNOSIS — S270XXD Traumatic pneumothorax, subsequent encounter: Secondary | ICD-10-CM | POA: Diagnosis not present

## 2020-10-01 DIAGNOSIS — I10 Essential (primary) hypertension: Secondary | ICD-10-CM | POA: Diagnosis not present

## 2020-10-03 ENCOUNTER — Other Ambulatory Visit (HOSPITAL_BASED_OUTPATIENT_CLINIC_OR_DEPARTMENT_OTHER): Payer: Self-pay

## 2020-10-03 DIAGNOSIS — G629 Polyneuropathy, unspecified: Secondary | ICD-10-CM | POA: Diagnosis not present

## 2020-10-03 DIAGNOSIS — E785 Hyperlipidemia, unspecified: Secondary | ICD-10-CM | POA: Diagnosis not present

## 2020-10-03 DIAGNOSIS — F32A Depression, unspecified: Secondary | ICD-10-CM | POA: Diagnosis not present

## 2020-10-03 DIAGNOSIS — I10 Essential (primary) hypertension: Secondary | ICD-10-CM | POA: Diagnosis not present

## 2020-10-03 DIAGNOSIS — Z9181 History of falling: Secondary | ICD-10-CM | POA: Diagnosis not present

## 2020-10-03 DIAGNOSIS — S2241XD Multiple fractures of ribs, right side, subsequent encounter for fracture with routine healing: Secondary | ICD-10-CM | POA: Diagnosis not present

## 2020-10-03 DIAGNOSIS — T797XXD Traumatic subcutaneous emphysema, subsequent encounter: Secondary | ICD-10-CM | POA: Diagnosis not present

## 2020-10-03 DIAGNOSIS — S270XXD Traumatic pneumothorax, subsequent encounter: Secondary | ICD-10-CM | POA: Diagnosis not present

## 2020-10-07 ENCOUNTER — Other Ambulatory Visit (HOSPITAL_BASED_OUTPATIENT_CLINIC_OR_DEPARTMENT_OTHER): Payer: Self-pay

## 2020-10-08 ENCOUNTER — Other Ambulatory Visit (HOSPITAL_BASED_OUTPATIENT_CLINIC_OR_DEPARTMENT_OTHER): Payer: Self-pay

## 2020-10-08 DIAGNOSIS — E785 Hyperlipidemia, unspecified: Secondary | ICD-10-CM | POA: Diagnosis not present

## 2020-10-08 DIAGNOSIS — I10 Essential (primary) hypertension: Secondary | ICD-10-CM | POA: Diagnosis not present

## 2020-10-08 DIAGNOSIS — T797XXD Traumatic subcutaneous emphysema, subsequent encounter: Secondary | ICD-10-CM | POA: Diagnosis not present

## 2020-10-08 DIAGNOSIS — F32A Depression, unspecified: Secondary | ICD-10-CM | POA: Diagnosis not present

## 2020-10-08 DIAGNOSIS — S2241XD Multiple fractures of ribs, right side, subsequent encounter for fracture with routine healing: Secondary | ICD-10-CM | POA: Diagnosis not present

## 2020-10-08 DIAGNOSIS — S270XXD Traumatic pneumothorax, subsequent encounter: Secondary | ICD-10-CM | POA: Diagnosis not present

## 2020-10-08 DIAGNOSIS — G629 Polyneuropathy, unspecified: Secondary | ICD-10-CM | POA: Diagnosis not present

## 2020-10-08 DIAGNOSIS — Z9181 History of falling: Secondary | ICD-10-CM | POA: Diagnosis not present

## 2020-10-09 ENCOUNTER — Other Ambulatory Visit (HOSPITAL_BASED_OUTPATIENT_CLINIC_OR_DEPARTMENT_OTHER): Payer: Self-pay

## 2020-10-10 DIAGNOSIS — T797XXD Traumatic subcutaneous emphysema, subsequent encounter: Secondary | ICD-10-CM | POA: Diagnosis not present

## 2020-10-10 DIAGNOSIS — S2241XD Multiple fractures of ribs, right side, subsequent encounter for fracture with routine healing: Secondary | ICD-10-CM | POA: Diagnosis not present

## 2020-10-10 DIAGNOSIS — G629 Polyneuropathy, unspecified: Secondary | ICD-10-CM | POA: Diagnosis not present

## 2020-10-10 DIAGNOSIS — I10 Essential (primary) hypertension: Secondary | ICD-10-CM | POA: Diagnosis not present

## 2020-10-10 DIAGNOSIS — S270XXD Traumatic pneumothorax, subsequent encounter: Secondary | ICD-10-CM | POA: Diagnosis not present

## 2020-10-10 DIAGNOSIS — E785 Hyperlipidemia, unspecified: Secondary | ICD-10-CM | POA: Diagnosis not present

## 2020-10-10 DIAGNOSIS — Z9181 History of falling: Secondary | ICD-10-CM | POA: Diagnosis not present

## 2020-10-10 DIAGNOSIS — F32A Depression, unspecified: Secondary | ICD-10-CM | POA: Diagnosis not present

## 2020-10-15 ENCOUNTER — Other Ambulatory Visit (HOSPITAL_BASED_OUTPATIENT_CLINIC_OR_DEPARTMENT_OTHER): Payer: Self-pay

## 2020-10-15 DIAGNOSIS — E785 Hyperlipidemia, unspecified: Secondary | ICD-10-CM | POA: Diagnosis not present

## 2020-10-15 DIAGNOSIS — G629 Polyneuropathy, unspecified: Secondary | ICD-10-CM | POA: Diagnosis not present

## 2020-10-15 DIAGNOSIS — I10 Essential (primary) hypertension: Secondary | ICD-10-CM | POA: Diagnosis not present

## 2020-10-15 DIAGNOSIS — S270XXD Traumatic pneumothorax, subsequent encounter: Secondary | ICD-10-CM | POA: Diagnosis not present

## 2020-10-15 DIAGNOSIS — Z9181 History of falling: Secondary | ICD-10-CM | POA: Diagnosis not present

## 2020-10-15 DIAGNOSIS — T797XXD Traumatic subcutaneous emphysema, subsequent encounter: Secondary | ICD-10-CM | POA: Diagnosis not present

## 2020-10-15 DIAGNOSIS — S2241XD Multiple fractures of ribs, right side, subsequent encounter for fracture with routine healing: Secondary | ICD-10-CM | POA: Diagnosis not present

## 2020-10-15 DIAGNOSIS — F32A Depression, unspecified: Secondary | ICD-10-CM | POA: Diagnosis not present

## 2020-10-22 DIAGNOSIS — E785 Hyperlipidemia, unspecified: Secondary | ICD-10-CM | POA: Diagnosis not present

## 2020-10-22 DIAGNOSIS — S2241XD Multiple fractures of ribs, right side, subsequent encounter for fracture with routine healing: Secondary | ICD-10-CM | POA: Diagnosis not present

## 2020-10-22 DIAGNOSIS — I10 Essential (primary) hypertension: Secondary | ICD-10-CM | POA: Diagnosis not present

## 2020-10-22 DIAGNOSIS — Z9181 History of falling: Secondary | ICD-10-CM | POA: Diagnosis not present

## 2020-10-22 DIAGNOSIS — G629 Polyneuropathy, unspecified: Secondary | ICD-10-CM | POA: Diagnosis not present

## 2020-10-22 DIAGNOSIS — S270XXD Traumatic pneumothorax, subsequent encounter: Secondary | ICD-10-CM | POA: Diagnosis not present

## 2020-10-22 DIAGNOSIS — T797XXD Traumatic subcutaneous emphysema, subsequent encounter: Secondary | ICD-10-CM | POA: Diagnosis not present

## 2020-10-22 DIAGNOSIS — F32A Depression, unspecified: Secondary | ICD-10-CM | POA: Diagnosis not present

## 2020-10-29 ENCOUNTER — Other Ambulatory Visit (HOSPITAL_BASED_OUTPATIENT_CLINIC_OR_DEPARTMENT_OTHER): Payer: Self-pay

## 2020-10-29 DIAGNOSIS — E785 Hyperlipidemia, unspecified: Secondary | ICD-10-CM | POA: Diagnosis not present

## 2020-10-29 DIAGNOSIS — S2241XD Multiple fractures of ribs, right side, subsequent encounter for fracture with routine healing: Secondary | ICD-10-CM | POA: Diagnosis not present

## 2020-10-29 DIAGNOSIS — S270XXD Traumatic pneumothorax, subsequent encounter: Secondary | ICD-10-CM | POA: Diagnosis not present

## 2020-10-29 DIAGNOSIS — F32A Depression, unspecified: Secondary | ICD-10-CM | POA: Diagnosis not present

## 2020-10-29 DIAGNOSIS — T797XXD Traumatic subcutaneous emphysema, subsequent encounter: Secondary | ICD-10-CM | POA: Diagnosis not present

## 2020-10-29 DIAGNOSIS — G629 Polyneuropathy, unspecified: Secondary | ICD-10-CM | POA: Diagnosis not present

## 2020-10-29 DIAGNOSIS — Z9181 History of falling: Secondary | ICD-10-CM | POA: Diagnosis not present

## 2020-10-29 DIAGNOSIS — I10 Essential (primary) hypertension: Secondary | ICD-10-CM | POA: Diagnosis not present

## 2020-11-05 DIAGNOSIS — S270XXD Traumatic pneumothorax, subsequent encounter: Secondary | ICD-10-CM | POA: Diagnosis not present

## 2020-11-05 DIAGNOSIS — T797XXD Traumatic subcutaneous emphysema, subsequent encounter: Secondary | ICD-10-CM | POA: Diagnosis not present

## 2020-11-05 DIAGNOSIS — F32A Depression, unspecified: Secondary | ICD-10-CM | POA: Diagnosis not present

## 2020-11-05 DIAGNOSIS — G629 Polyneuropathy, unspecified: Secondary | ICD-10-CM | POA: Diagnosis not present

## 2020-11-05 DIAGNOSIS — I10 Essential (primary) hypertension: Secondary | ICD-10-CM | POA: Diagnosis not present

## 2020-11-05 DIAGNOSIS — E785 Hyperlipidemia, unspecified: Secondary | ICD-10-CM | POA: Diagnosis not present

## 2020-11-05 DIAGNOSIS — S2241XD Multiple fractures of ribs, right side, subsequent encounter for fracture with routine healing: Secondary | ICD-10-CM | POA: Diagnosis not present

## 2020-11-05 DIAGNOSIS — Z9181 History of falling: Secondary | ICD-10-CM | POA: Diagnosis not present

## 2020-11-11 ENCOUNTER — Other Ambulatory Visit (HOSPITAL_BASED_OUTPATIENT_CLINIC_OR_DEPARTMENT_OTHER): Payer: Self-pay

## 2020-11-11 ENCOUNTER — Telehealth: Payer: Self-pay | Admitting: Internal Medicine

## 2020-11-11 MED ORDER — ESCITALOPRAM OXALATE 20 MG PO TABS
20.0000 mg | ORAL_TABLET | Freq: Every day | ORAL | 1 refills | Status: DC
  Filled 2020-11-11: qty 90, 90d supply, fill #0
  Filled 2021-03-12: qty 90, 90d supply, fill #1

## 2020-11-11 NOTE — Telephone Encounter (Signed)
Rx sent 

## 2020-11-11 NOTE — Telephone Encounter (Signed)
Pt. Needs medication sent to pharmacy downstairs [facility] of [facility] for the following medication:  escitalopram (LEXAPRO) 20 MG tablet

## 2020-12-05 ENCOUNTER — Other Ambulatory Visit (HOSPITAL_BASED_OUTPATIENT_CLINIC_OR_DEPARTMENT_OTHER): Payer: Self-pay

## 2020-12-30 ENCOUNTER — Other Ambulatory Visit (HOSPITAL_BASED_OUTPATIENT_CLINIC_OR_DEPARTMENT_OTHER): Payer: Self-pay

## 2020-12-30 ENCOUNTER — Telehealth: Payer: Self-pay | Admitting: Internal Medicine

## 2020-12-30 MED ORDER — BUPROPION HCL ER (SR) 150 MG PO TB12
300.0000 mg | ORAL_TABLET | Freq: Every day | ORAL | 1 refills | Status: DC
  Filled 2020-12-30: qty 180, 90d supply, fill #0

## 2020-12-30 NOTE — Telephone Encounter (Signed)
Medication: buPROPion (WELLBUTRIN SR) 150 MG 12 hr tablet  Has the patient contacted their pharmacy? Yes.   (If no, request that the patient contact the pharmacy for the refill.) (If yes, when and what did the pharmacy advise?)  Preferred Pharmacy (with phone number or street name): [facility]  [address], [address] [place] [address]  Phone:  [phone]  Fax:  [phone]  Agent: Please be advised that RX refills may take up to 3 business days. We ask that you follow-up with your pharmacy.

## 2020-12-30 NOTE — Telephone Encounter (Signed)
Rx sent 

## 2021-03-13 ENCOUNTER — Other Ambulatory Visit (HOSPITAL_BASED_OUTPATIENT_CLINIC_OR_DEPARTMENT_OTHER): Payer: Self-pay

## 2021-03-25 ENCOUNTER — Telehealth: Payer: Self-pay | Admitting: Internal Medicine

## 2021-03-26 ENCOUNTER — Other Ambulatory Visit (HOSPITAL_BASED_OUTPATIENT_CLINIC_OR_DEPARTMENT_OTHER): Payer: Self-pay

## 2021-03-26 MED ORDER — ALPRAZOLAM 0.5 MG PO TABS
ORAL_TABLET | ORAL | 0 refills | Status: DC
  Filled 2021-03-26: qty 90, 30d supply, fill #0

## 2021-03-26 NOTE — Telephone Encounter (Signed)
PDMP okay, Rx sent 

## 2021-03-26 NOTE — Telephone Encounter (Signed)
Refill request for alprazolam 0.5mg - med no longer on med list.

## 2021-04-22 ENCOUNTER — Ambulatory Visit (INDEPENDENT_AMBULATORY_CARE_PROVIDER_SITE_OTHER): Payer: Medicare Other | Admitting: Internal Medicine

## 2021-04-22 ENCOUNTER — Encounter: Payer: Self-pay | Admitting: Internal Medicine

## 2021-04-22 VITALS — BP 126/70 | HR 53 | Temp 98.4°F | Resp 18 | Ht 66.0 in | Wt 139.0 lb

## 2021-04-22 DIAGNOSIS — E038 Other specified hypothyroidism: Secondary | ICD-10-CM | POA: Diagnosis not present

## 2021-04-22 DIAGNOSIS — R739 Hyperglycemia, unspecified: Secondary | ICD-10-CM | POA: Diagnosis not present

## 2021-04-22 DIAGNOSIS — I10 Essential (primary) hypertension: Secondary | ICD-10-CM | POA: Diagnosis not present

## 2021-04-22 DIAGNOSIS — R269 Unspecified abnormalities of gait and mobility: Secondary | ICD-10-CM

## 2021-04-22 DIAGNOSIS — G3184 Mild cognitive impairment, so stated: Secondary | ICD-10-CM | POA: Diagnosis not present

## 2021-04-22 NOTE — Patient Instructions (Addendum)
Try to minimize the use of Xanax to half tablet 3 times a day at most.    We are referring you to physical therapy  GO TO THE LAB : Get the blood work     GO TO THE FRONT DESK, PLEASE SCHEDULE YOUR APPOINTMENTS Come back for   a checkup in 2 months

## 2021-04-22 NOTE — Progress Notes (Signed)
Subjective:    Patient ID: Andrea Martin, female    DOB: 09-Jun-1935, 86 y.o.   MRN: [id number]  DOS:  04/22/2021 Type of visit - description: Acute, here with her daughter, several concerns  She lost her husband earlier this month. Emotionally she is doing well although she recognized that is still  difficult. Since then they have noticed some issues.  Essential tremor is more noticeable. She is a little more anxious before; she used to take half Xanax a day and now she usually takes 1/2 tab  TID Has been confused at times. For instance last night she thought she was still working, she has not working 12 years. The daughter denies any wandering, no hallucinations.  Also had 2 falls in the last month, both happening when she bent her knees and leaning forward and  lost her balance.  Fortunately no major head or neck injury. No LOC.   Review of Systems See above   Past Medical History:  Diagnosis Date   Anxiety and depression    Gait disorder    History of fractures due to falls 09/01/2020   HTN (hypertension) 09/01/2020   Hyperlipidemia    Neuropathy 08/04/2013   Shoulder dislocation    h/o after a fall    Past Surgical History:  Procedure Laterality Date   NO PAST SURGERIES      Current Outpatient Medications  Medication Instructions   acetaminophen (TYLENOL) 1,000 mg, Oral, Every 6 hours   ALPRAZolam (XANAX) 0.5 MG tablet TAKE 1/2 - 1 TABLET BY MOUTH 3 TIMES DAILY AS NEEDED FOR ANXIETY   azelastine (ASTELIN) 0.1 % nasal spray 2 sprays, Each Nare, At bedtime PRN, Use in each nostril as directed   buPROPion (WELLBUTRIN SR) 300 mg, Oral, Daily   Calcium Carb-Cholecalciferol (CALCIUM 600+D3) 600-800 MG-UNIT TABS 1 tablet, Oral, Every morning   diclofenac (FLECTOR) 1.3 % PTCH 1 patch, Transdermal, 2 times daily   escitalopram (LEXAPRO) 20 mg, Oral, Daily   Fish Oil 600 mg, Oral, Every morning   GLUCOSAMINE-CHONDROITIN PO 1 tablet, Oral, Every morning, Glucosamine 1500 mg,  chondroitin 1200 mg   losartan (COZAAR) 50 mg, Oral, Daily   meclizine (ANTIVERT) 12.5 mg, Oral, 3 times daily PRN   methocarbamol (ROBAXIN) 1,000 mg, Oral, Every 6 hours PRN   Multiple Vitamin (MULTIVITAMIN WITH MINERALS) TABS tablet 1 tablet, Oral, Every morning   polyethylene glycol powder (GLYCOLAX/MIRALAX) 17 GM/SCOOP powder Mix 1 capful in 8 oz of liquid and drink everyday   potassium chloride SA (KLOR-CON) 20 MEQ tablet 20 mEq, Oral, 2 times daily   pravastatin (PRAVACHOL) 20 mg, Oral, Daily at bedtime   senna-docusate (SENOKOT-S) 8.6-50 MG tablet 1 tablet, Oral, 2 times daily   Vitamin D3 2,000 Units, Oral, Every morning       Objective:   Physical Exam BP 126/70 (BP Location: Left Arm, Patient Position: Sitting, Cuff Size: Small)    Pulse (!) 53    Temp 98.4 F (36.9 C) (Oral)    Resp 18    Ht 5\' 6"  (1.676 m)    Wt 139 lb (63 kg)    SpO2 93%    BMI 22.44 kg/m  General:   Well developed, NAD, BMI noted. HEENT:  Normocephalic . Face symmetric, atraumatic Lungs:  CTA B Normal respiratory effort, no intercostal retractions, no accessory muscle use. Heart: RRR,  no murmur.  Lower extremities: no pretibial edema bilaterally  Skin: Not pale. Not jaundice Neurologic:  alert & oriented X3.  Speech normal, gait appropriate for age and unassisted. + Hand tremors noted. Motor and DTR symmetric Psych--  Cognition and judgment appear intact.  Cooperative with normal attention span and concentration.  Behavior appropriate. No anxious or depressed appearing.      Assessment      Assessment   HTN: DX 10/2019. Hyperlipidemia.  Simvastatin intolerant.  Rx Pravachol 10-2016  Neuropathy: DX 2015, feet numbness, decreased pinprick exam,  declined NCS Tremor, likely essential ; dx 11/2015 ++ s/e w/ primidone (2018) Anxiety depression Gait d/o, falls, referred to PT 2014 Cerebellar: Remote infarct per MRI 10-2018 Osteopenia: T score -1.6 10-2018  PLAN: Here with multiple concerns,  here with her daughter MCI?  Since she lost her husband less than a month ago she has been slightly confused on and off as described above however no behavioral issues, no wandering. We are getting labs to rule out correctable causes of MCI: UA, urine culture, [id number], folic acid, TSH.  Sxs could be related to stress related to losing her husband few weeks ago. Reassess in 2 months. Essential tremors: More noticeable lately.  With talk about possibly neurology referral for further eval noting she has side effects with primidone before.  Declined for now, reassess in the future. Anxiety, depression. Chronic issue, slightly worse lately, she recently lost her husband, for now continue Wellbutrin, Lexapro.  Continue with Xanax, used to take 0.5 tablets a day on average, now taking 0.5 tablets 3 times a day.  Denies feeling oversedated ( daughter agrees). HTN: BP is very good, continue losartan.  Check BMP, CBC. Falls: As described above, in the context as bending forward, she think is due to weakness of the lower extremities.  Referred to PT. Increased TSH before: Check TSH and free T4 RTC 2 months (for  MMSE)  This visit occurred during the SARS-CoV-2 public health emergency.  Safety protocols were in place, including screening questions prior to the visit, additional usage of staff PPE, and extensive cleaning of exam room while observing appropriate contact time as indicated for disinfecting solutions.

## 2021-04-23 LAB — CBC WITH DIFFERENTIAL/PLATELET
Basophils Absolute: 0.1 10*3/uL (ref 0.0–0.1)
Basophils Relative: 1.2 % (ref 0.0–3.0)
Eosinophils Absolute: 0.1 10*3/uL (ref 0.0–0.7)
Eosinophils Relative: 2.9 % (ref 0.0–5.0)
HCT: 37.9 % (ref 36.0–46.0)
Hemoglobin: 12.5 g/dL (ref 12.0–15.0)
Lymphocytes Relative: 33.3 % (ref 12.0–46.0)
Lymphs Abs: 1.6 10*3/uL (ref 0.7–4.0)
MCHC: 33 g/dL (ref 30.0–36.0)
MCV: 96.1 fl (ref 78.0–100.0)
Monocytes Absolute: 0.4 10*3/uL (ref 0.1–1.0)
Monocytes Relative: 8.7 % (ref 3.0–12.0)
Neutro Abs: 2.6 10*3/uL (ref 1.4–7.7)
Neutrophils Relative %: 53.9 % (ref 43.0–77.0)
Platelets: 244 10*3/uL (ref 150.0–400.0)
RBC: 3.94 Mil/uL (ref 3.87–5.11)
RDW: 13.4 % (ref 11.5–15.5)
WBC: 4.9 10*3/uL (ref 4.0–10.5)

## 2021-04-23 LAB — B12 AND FOLATE PANEL
Folate: >24.2 ng/mL (ref >5.9)
Vitamin B-12: 320 pg/mL (ref 211–911)

## 2021-04-23 LAB — T4, FREE: Free T4: 0.8 ng/dL (ref 0.60–1.60)

## 2021-04-23 LAB — TSH: TSH: 3.16 u[IU]/mL (ref 0.35–5.50)

## 2021-04-23 LAB — BASIC METABOLIC PANEL
BUN: 18 mg/dL (ref 6–23)
CO2: 29 mEq/L (ref 19–32)
Calcium: 9.2 mg/dL (ref 8.4–10.5)
Chloride: 105 mEq/L (ref 96–112)
Creatinine, Ser: 1 mg/dL (ref 0.40–1.20)
GFR: 51.87 mL/min — ABNORMAL LOW (ref >60.00)
Glucose, Bld: 88 mg/dL (ref 70–99)
Potassium: 4 mEq/L (ref 3.5–5.1)
Sodium: 140 mEq/L (ref 135–145)

## 2021-04-23 LAB — HEMOGLOBIN A1C: Hgb A1c MFr Bld: 5.3 % (ref 4.6–6.5)

## 2021-04-23 NOTE — Assessment & Plan Note (Signed)
Here with multiple concerns, here with her daughter ?MCI?  Since she lost her husband less than a month ago she has been slightly confused on and off as described above however no behavioral issues, no wandering. ?We are getting labs to rule out correctable causes of MCI: UA, urine culture, B12, folic acid, TSH.  Sxs could be related to stress related to losing her husband few weeks ago. Reassess in 2 months. ?Essential tremors: More noticeable lately.  With talk about possibly neurology referral for further eval noting she has side effects with primidone before.  Declined for now, reassess in the future. ?Anxiety, depression. ?Chronic issue, slightly worse lately, she recently lost her husband, for now continue Wellbutrin, Lexapro.  Continue with Xanax, used to take 0.5 tablets a day on average, now taking 0.5 tablets 3 times a day.  Denies feeling oversedated ( daughter agrees). ?HTN: BP is very good, continue losartan.  Check BMP, CBC. ?Falls: As described above, in the context as bending forward, she think is due to weakness of the lower extremities.  Referred to PT. ?Increased TSH before: Check TSH and free T4 ?RTC 2 months (for  MMSE) ?

## 2021-04-24 ENCOUNTER — Other Ambulatory Visit (INDEPENDENT_AMBULATORY_CARE_PROVIDER_SITE_OTHER): Payer: Medicare Other

## 2021-04-24 DIAGNOSIS — G3184 Mild cognitive impairment, so stated: Secondary | ICD-10-CM

## 2021-04-24 DIAGNOSIS — N390 Urinary tract infection, site not specified: Secondary | ICD-10-CM | POA: Diagnosis not present

## 2021-04-25 LAB — URINALYSIS, ROUTINE W REFLEX MICROSCOPIC
Bilirubin Urine: NEGATIVE
Hgb urine dipstick: NEGATIVE
Ketones, ur: NEGATIVE
Nitrite: NEGATIVE
Specific Gravity, Urine: 1.025 (ref 1.000–1.030)
Total Protein, Urine: NEGATIVE
Urine Glucose: NEGATIVE
Urobilinogen, UA: 0.2 (ref 0.0–1.0)
pH: 6 (ref 5.0–8.0)

## 2021-04-26 LAB — URINE CULTURE
MICRO NUMBER:: 13079666
SPECIMEN QUALITY:: ADEQUATE

## 2021-05-06 ENCOUNTER — Encounter: Payer: Self-pay | Admitting: Internal Medicine

## 2021-05-07 MED ORDER — ESCITALOPRAM OXALATE 20 MG PO TABS: 20.0000 mg | ORAL_TABLET | Freq: Every day | ORAL | 1 refills | Status: DC

## 2021-05-07 MED ORDER — LOSARTAN POTASSIUM 50 MG PO TABS: 50.0000 mg | ORAL_TABLET | Freq: Every day | ORAL | 1 refills | Status: DC

## 2021-05-07 MED ORDER — BUPROPION HCL ER (SR) 150 MG PO TB12: 300.0000 mg | ORAL_TABLET | Freq: Every day | ORAL | 1 refills | Status: DC

## 2021-05-07 MED ORDER — PRAVASTATIN SODIUM 20 MG PO TABS: 20.0000 mg | ORAL_TABLET | Freq: Every day | ORAL | 1 refills | Status: DC

## 2021-05-08 MED ORDER — ALPRAZOLAM 0.5 MG PO TABS: ORAL_TABLET | ORAL | 0 refills | Status: DC

## 2021-05-08 NOTE — Telephone Encounter (Signed)
PDMP okay, Rx sent 

## 2021-05-08 NOTE — Telephone Encounter (Signed)
Requesting: alprazolam 0.5mg  ?Contract: 04/22/2021 ?UDS: 06/20/2020 ?Last Visit: 04/22/2021 ?Next Visit: 06/20/2021 ?Last Refill: 03/26/2021 #90 and 0RF ?Pt sig: 1 tab tid prn ? ?Please Advise ? ?

## 2021-06-20 ENCOUNTER — Encounter: Payer: Self-pay | Admitting: Internal Medicine

## 2021-06-20 ENCOUNTER — Ambulatory Visit (INDEPENDENT_AMBULATORY_CARE_PROVIDER_SITE_OTHER): Payer: Medicare Other | Admitting: Internal Medicine

## 2021-06-20 VITALS — BP 122/74 | HR 64 | Temp 98.3°F | Resp 18 | Ht 66.0 in | Wt 135.5 lb

## 2021-06-20 DIAGNOSIS — G3184 Mild cognitive impairment, so stated: Secondary | ICD-10-CM

## 2021-06-20 DIAGNOSIS — F419 Anxiety disorder, unspecified: Secondary | ICD-10-CM

## 2021-06-20 DIAGNOSIS — F32A Depression, unspecified: Secondary | ICD-10-CM

## 2021-06-20 MED ORDER — DONEPEZIL HCL 5 MG PO TABS: 5.0000 mg | ORAL_TABLET | Freq: Every day | ORAL | 1 refills | Status: DC

## 2021-06-20 NOTE — Progress Notes (Signed)
? ?Subjective:  ? ? Patient ID: Andrea Martin, female    DOB: September 25, 1935, 86 y.o.   MRN: [id number] ? ?DOS:  06/20/2021 ?Type of visit - description: f/u ? ?Follow-up from previous visit. ?Overall feels much better. ? ? ?Review of Systems ?See above  ? ?Past Medical History:  ?Diagnosis Date  ? Anxiety and depression   ? Gait disorder   ? History of fractures due to falls 09/01/2020  ? HTN (hypertension) 09/01/2020  ? Hyperlipidemia   ? Neuropathy 08/04/2013  ? Shoulder dislocation   ? h/o after a fall  ? ? ?Past Surgical History:  ?Procedure Laterality Date  ? NO PAST SURGERIES    ? ? ?Current Outpatient Medications  ?Medication Instructions  ? acetaminophen (TYLENOL) 1,000 mg, Oral, Every 6 hours  ? ALPRAZolam (XANAX) 0.5 MG tablet TAKE 1/2 - 1 TABLET BY MOUTH 3 TIMES DAILY AS NEEDED FOR ANXIETY  ? azelastine (ASTELIN) 0.1 % nasal spray 2 sprays, Each Nare, At bedtime PRN, Use in each nostril as directed  ? buPROPion (WELLBUTRIN SR) 300 mg, Oral, Daily  ? Calcium Carb-Cholecalciferol (CALCIUM 600+D3) 600-800 MG-UNIT TABS 1 tablet, Oral, Every morning  ? diclofenac (FLECTOR) 1.3 % PTCH 1 patch, Transdermal, 2 times daily  ? escitalopram (LEXAPRO) 20 mg, Oral, Daily  ? Fish Oil 600 mg, Oral, Every morning  ? GLUCOSAMINE-CHONDROITIN PO 1 tablet, Oral, Every morning, Glucosamine 1500 mg, chondroitin 1200 mg  ? losartan (COZAAR) 50 mg, Oral, Daily  ? meclizine (ANTIVERT) 12.5 mg, Oral, 3 times daily PRN  ? Multiple Vitamin (MULTIVITAMIN WITH MINERALS) TABS tablet 1 tablet, Oral, Every morning  ? polyethylene glycol powder (GLYCOLAX/MIRALAX) 17 GM/SCOOP powder Mix 1 capful in 8 oz of liquid and drink everyday  ? pravastatin (PRAVACHOL) 20 mg, Oral, Daily at bedtime  ? senna-docusate (SENOKOT-S) 8.6-50 MG tablet 1 tablet, Oral, 2 times daily  ? Vitamin D3 2,000 Units, Oral, Every morning  ? ? ?   ?Objective:  ? Physical Exam ?BP 122/74 (BP Location: Left Arm, Patient Position: Sitting, Cuff Size: Small)   Pulse 64   Temp  98.3 ?F (36.8 ?C) (Oral)   Resp 18   Ht 5\' 6"  (1.676 m)   Wt 135 lb 8 oz (61.5 kg)   SpO2 95%   BMI 21.87 kg/m?  ?General:   ?Well developed, NAD, BMI noted. ?HEENT:  ?Normocephalic . Face symmetric, atraumatic ?Neurologic:  ?alert & MMSE score 25 ?Speech normal, gait appropriate for age and unassisted ?Psych--  ?Cognition and judgment appear intact.  ?Cooperative with normal attention span and concentration.  ?Behavior appropriate. ?No anxious or depressed appearing.  ? ?   ?Assessment   ? ?Assessment   ?HTN: DX 10/2019. ?Hyperlipidemia.  Simvastatin intolerant.  Rx Pravachol 10-2016  ?Neuropathy: DX 2015, feet numbness, decreased pinprick exam,  declined NCS ?Tremor, likely essential ; dx 11/2015 ++ s/e w/ primidone (2018) ?Anxiety depression ?Gait d/o, falls, referred to PT 2014 ?Cerebellar: Remote infarct per MRI 10-2018 ?Osteopenia: T score -1.6 10-2018 ? ?PLAN: ?MCI: ?Since the last office visit, labs were okay, she continue with memory issues according to the patient, she is not driving, she is however alert oriented x3, MMSE is 25, mild dementia, I recommended to stay active physically and mentally, she likes to crochet and puzzles and plans to get busy with that. ?We talk about medications and we agreed on Aricept 5 mg, Rx sent, reassess in 3 months ?Essential tremors: At baseline. ?Anxiety and depression:  still affected by  the loss of her husband but doing better compared to the last time she was here.  On Lexapro, Wellbutrin, currently taking only half Xanax twice daily.  Much less than before. ?Falls: Since the last office visit had 1 fall, apparently the dog got in the way, no major injury.  Declined PT, but is planning to do self exercises using old information from previous PTs. ?RTC 4 months ?  ? ?Time spent 30 min performing a MMSE and discussing results, advising. ?

## 2021-06-20 NOTE — Patient Instructions (Signed)
Start taking Aricept 5 mg 1 tablet daily, okay to take it at bedtime if you like to ? ?Come back in 3 or 4 months for a checkup ?

## 2021-06-22 NOTE — Assessment & Plan Note (Signed)
MCI: ?Since the last office visit, labs were okay, she continue with memory issues according to the patient, she is not driving, she is however alert oriented x3, MMSE is 25, mild dementia, I recommended to stay active physically and mentally, she likes to crochet and puzzles and plans to get busy with that. ?We talk about medications and we agreed on Aricept 5 mg, Rx sent, reassess in 3 months ?Essential tremors: At baseline. ?Anxiety and depression:  still affected by the loss of her husband but doing better compared to the last time she was here.  On Lexapro, Wellbutrin, currently taking only half Xanax twice daily.  Much less than before. ?Falls: Since the last office visit had 1 fall, apparently the dog got in the way, no major injury.  Declined PT, but is planning to do self exercises using old information from previous PTs. ?RTC 4 months ?  ?

## 2021-06-27 DIAGNOSIS — H524 Presbyopia: Secondary | ICD-10-CM | POA: Diagnosis not present

## 2021-07-15 ENCOUNTER — Other Ambulatory Visit: Payer: Self-pay | Admitting: Internal Medicine

## 2021-07-22 ENCOUNTER — Other Ambulatory Visit: Payer: Self-pay | Admitting: Internal Medicine

## 2021-08-28 ENCOUNTER — Other Ambulatory Visit: Payer: Self-pay | Admitting: Internal Medicine

## 2021-08-28 ENCOUNTER — Telehealth: Payer: Self-pay | Admitting: Internal Medicine

## 2021-08-28 NOTE — Telephone Encounter (Signed)
Left message for patient to call back and schedule Medicare Annual Wellness Visit (AWV).   Please offer to do virtually or by telephone.  Left office number and my jabber #[phone].  Last AWV:08/28/2020  Please schedule at anytime with Nurse Health Advisor.   

## 2021-10-17 ENCOUNTER — Other Ambulatory Visit: Payer: Self-pay | Admitting: Internal Medicine

## 2021-10-22 ENCOUNTER — Emergency Department (HOSPITAL_BASED_OUTPATIENT_CLINIC_OR_DEPARTMENT_OTHER): Payer: Medicare Other | Admitting: Radiology

## 2021-10-22 ENCOUNTER — Emergency Department (HOSPITAL_BASED_OUTPATIENT_CLINIC_OR_DEPARTMENT_OTHER)
Admission: EM | Admit: 2021-10-22 | Discharge: 2021-10-22 | Disposition: A | Discharge status: Home / Self Care | Payer: Medicare Other | Attending: Emergency Medicine | Admitting: Emergency Medicine

## 2021-10-22 ENCOUNTER — Encounter (HOSPITAL_BASED_OUTPATIENT_CLINIC_OR_DEPARTMENT_OTHER): Payer: Self-pay

## 2021-10-22 ENCOUNTER — Other Ambulatory Visit: Payer: Self-pay

## 2021-10-22 DIAGNOSIS — W01198A Fall on same level from slipping, tripping and stumbling with subsequent striking against other object, initial encounter: Secondary | ICD-10-CM | POA: Insufficient documentation

## 2021-10-22 DIAGNOSIS — S20212A Contusion of left front wall of thorax, initial encounter: Secondary | ICD-10-CM | POA: Diagnosis not present

## 2021-10-22 DIAGNOSIS — R0781 Pleurodynia: Secondary | ICD-10-CM | POA: Diagnosis not present

## 2021-10-22 DIAGNOSIS — S299XXA Unspecified injury of thorax, initial encounter: Secondary | ICD-10-CM | POA: Diagnosis present

## 2021-10-22 NOTE — ED Notes (Signed)
Pain left side of her back, nothing obvious. No other complaint. Pain while taking a deep breath in but other wise very little pain.

## 2021-10-22 NOTE — ED Triage Notes (Signed)
Patient here POV from Home.  Endorses Attempting to Reach for an Item today approximately 1 Hour ago when she slipped and Fell.  No LOC. No Head Injury. No Anticoagulants. Endorses Pain to Left lateral Torso.   NAD Noted during Triage. A&Ox4. GCS 15. BIB Wheelchair.

## 2021-10-22 NOTE — ED Notes (Signed)
Discharge paperwork given and verbally understood. 

## 2021-10-22 NOTE — Discharge Instructions (Addendum)
Ice to area of pain.  Tylenol for discomfort.  See your Physicain for recheck as scheduled

## 2021-10-26 NOTE — ED Provider Notes (Signed)
[facility] EMERGENCY DEPT Provider Note   CSN: [id number] Arrival date & time: 10/22/21  1636     History  Chief Complaint  Patient presents with   Andrea Martin is a 86 y.o. female.  Pt complains of she slipped and fell and hit her left chest.  No impact of her head.  No loss of consciousness.  Pt reports soreness in her chest   The history is provided by the patient. No language interpreter was used.  Fall This is a new problem. The current episode started 1 to 2 hours ago. The problem occurs constantly. The problem has not changed since onset.Pertinent negatives include no shortness of breath.       Home Medications Prior to Admission medications   Medication Sig Start Date End Date Taking? Authorizing Provider  donepezil (ARICEPT) 5 MG tablet Take 1 tablet (5 mg total) by mouth at bedtime. 08/28/21   Wanda Plump, MD  acetaminophen (TYLENOL) 500 MG tablet Take 2 tablets (1,000 mg total) by mouth every 6 (six) hours. 09/04/20   Pennie Banter, DO  ALPRAZolam Prudy Feeler) 0.5 MG tablet TAKE 1/2 - 1 TABLET BY MOUTH 3 TIMES DAILY AS NEEDED FOR ANXIETY 05/08/21   Wanda Plump, MD  azelastine (ASTELIN) 0.1 % nasal spray Place 2 sprays into both nostrils at bedtime as needed for rhinitis. Use in each nostril as directed 12/15/17   Wanda Plump, MD  buPROPion [facility] SR) 150 MG 12 hr tablet Take 2 tablets (300 mg total) by mouth daily. 05/07/21   Wanda Plump, MD  Calcium Carb-Cholecalciferol (CALCIUM 600+D3) 600-800 MG-UNIT TABS Take 1 tablet by mouth every morning.    [provider]  Cholecalciferol (VITAMIN D3) 50 MCG (2000 UT) TABS Take 2,000 Units by mouth every morning.    [provider]  diclofenac (FLECTOR) 1.3 % PTCH Place 1 patch onto the skin 2 (two) times daily. 09/04/20   Esaw Grandchild A, DO  escitalopram (LEXAPRO) 20 MG tablet Take 1 tablet (20 mg total) by mouth daily. 05/07/21   Wanda Plump, MD  GLUCOSAMINE-CHONDROITIN PO Take 1  tablet by mouth every morning. Glucosamine 1500 mg, chondroitin 1200 mg    [provider]  losartan (COZAAR) 50 MG tablet Take 1 tablet (50 mg total) by mouth daily. 10/17/21   Wanda Plump, MD  meclizine (ANTIVERT) 12.5 MG tablet Take 1 tablet (12.5 mg total) by mouth 3 (three) times daily as needed for dizziness. Patient not taking: Reported on 04/22/2021 11/03/19   Saguier, Ramon Dredge, PA-C  Multiple Vitamin (MULTIVITAMIN WITH MINERALS) TABS tablet Take 1 tablet by mouth every morning.    [provider]  Omega-3 Fatty Acids (FISH OIL) 600 MG CAPS Take 600 mg by mouth every morning.    [provider]  polyethylene glycol powder (GLYCOLAX/MIRALAX) 17 GM/SCOOP powder Mix 1 capful in 8 oz of liquid and drink everyday 09/05/20   Esaw Grandchild A, DO  pravastatin (PRAVACHOL) 20 MG tablet TAKE 1 TABLET BY MOUTH AT  BEDTIME 07/22/21   Paz, Nolon Rod, MD  senna-docusate (SENOKOT-S) 8.6-50 MG tablet Take 1 tablet by mouth 2 (two) times daily. Patient not taking: Reported on 06/20/2021 09/04/20   Esaw Grandchild A, DO      Allergies    Codeine, Sulfa antibiotics, and Penicillins    Review of Systems   Review of Systems  Respiratory:  Negative for cough and shortness of breath.   All other  systems reviewed and are negative.   Physical Exam Updated Vital Signs BP (!) 193/60 (BP Location: Right Arm)   Pulse (!) 59   Temp 98.2 F (36.8 C)   Resp 16   Ht 5\' 6"  (1.676 m)   Wt 61.5 kg   SpO2 97%   BMI 21.88 kg/m  Physical Exam Vitals and nursing note reviewed.  Constitutional:      Appearance: She is well-developed.  HENT:     Head: Normocephalic.  Cardiovascular:     Rate and Rhythm: Normal rate and regular rhythm.     Comments: Tender left chest  Pulmonary:     Effort: Pulmonary effort is normal.  Abdominal:     General: Abdomen is flat. There is no distension.  Musculoskeletal:        General: Normal range of motion.     Cervical back: Normal range of motion.   Skin:    General: Skin is warm.  Neurological:     General: No focal deficit present.     Mental Status: She is alert and oriented to person, place, and time.  Psychiatric:        Mood and Affect: Mood normal.     ED Results / Procedures / Treatments   Labs (all labs ordered are listed, but only abnormal results are displayed) Labs Reviewed - No data to display  EKG None  Radiology No results found.  Procedures Procedures    Medications Ordered in ED Medications - No data to display  ED Course/ Medical Decision Making/ A&P                           Medical Decision Making Pt complains of pain in her chest    Amount and/or Complexity of Data Reviewed External Data Reviewed: notes.    Details: Notes reviewed  Radiology: ordered and independent interpretation performed. Decision-making details documented in ED Course.    Details: No left rib fractures,  old healed right rib fractures   Risk Risk Details: Pt advised of results.  Pt advised to follow up with primary for recheck            Final Clinical Impression(s) / ED Diagnoses Final diagnoses:  Contusion of left chest wall, initial encounter    Rx / DC Orders ED Discharge Orders     None     An After Visit Summary was printed and given to the patient.     , Elson Areas 10/26/21 1014    12/26/21, MD 10/26/21 1059

## 2021-10-28 ENCOUNTER — Ambulatory Visit (HOSPITAL_BASED_OUTPATIENT_CLINIC_OR_DEPARTMENT_OTHER)
Admission: RE | Admit: 2021-10-28 | Discharge: 2021-10-28 | Disposition: A | Discharge status: Home / Self Care | Payer: Medicare Other | Source: Ambulatory Visit | Attending: Internal Medicine | Admitting: Internal Medicine

## 2021-10-28 ENCOUNTER — Other Ambulatory Visit (HOSPITAL_BASED_OUTPATIENT_CLINIC_OR_DEPARTMENT_OTHER): Payer: Self-pay

## 2021-10-28 ENCOUNTER — Ambulatory Visit (INDEPENDENT_AMBULATORY_CARE_PROVIDER_SITE_OTHER): Payer: Medicare Other | Admitting: Internal Medicine

## 2021-10-28 ENCOUNTER — Encounter: Payer: Self-pay | Admitting: Internal Medicine

## 2021-10-28 VITALS — BP 138/68 | HR 78 | Temp 98.3°F | Resp 16 | Ht 66.0 in | Wt 135.1 lb

## 2021-10-28 DIAGNOSIS — S20212D Contusion of left front wall of thorax, subsequent encounter: Secondary | ICD-10-CM | POA: Insufficient documentation

## 2021-10-28 DIAGNOSIS — M4186 Other forms of scoliosis, lumbar region: Secondary | ICD-10-CM | POA: Diagnosis not present

## 2021-10-28 DIAGNOSIS — R42 Dizziness and giddiness: Secondary | ICD-10-CM | POA: Diagnosis not present

## 2021-10-28 DIAGNOSIS — S20212A Contusion of left front wall of thorax, initial encounter: Secondary | ICD-10-CM | POA: Diagnosis not present

## 2021-10-28 DIAGNOSIS — Z79899 Other long term (current) drug therapy: Secondary | ICD-10-CM

## 2021-10-28 DIAGNOSIS — J9811 Atelectasis: Secondary | ICD-10-CM | POA: Diagnosis not present

## 2021-10-28 DIAGNOSIS — F32A Depression, unspecified: Secondary | ICD-10-CM

## 2021-10-28 DIAGNOSIS — G3184 Mild cognitive impairment, so stated: Secondary | ICD-10-CM | POA: Diagnosis not present

## 2021-10-28 MED ORDER — TETANUS-DIPHTH-ACELL PERTUSSIS 5-2.5-18.5 LF-MCG/0.5 IM SUSP: 0.5000 mL | Freq: Once | INTRAMUSCULAR | 0 refills | Status: DC

## 2021-10-28 MED ORDER — TETANUS-DIPHTH-ACELL PERTUSSIS 5-2.5-18.5 LF-MCG/0.5 IM SUSY
PREFILLED_SYRINGE | INTRAMUSCULAR | 0 refills | Status: DC
  Filled 2021-12-19: qty 0.5, 1d supply, fill #0

## 2021-10-28 MED ORDER — ALENDRONATE SODIUM 70 MG PO TABS: 70.0000 mg | ORAL_TABLET | ORAL | 3 refills | Status: DC

## 2021-10-28 NOTE — Patient Instructions (Addendum)
Recommend to proceed with the following vaccines at your pharmacy:  Tdap (tetanus) Flu shot- high dose Covid booster (bivalent)     Try Aricept   (Donepezil)  5 mg 1 tablet daily again.  If you get more dizzy, then stop and let me know    Start taking Fosamax (alendronate) once a week. Take it on an empty stomach with 2 glasses of water Stay upright for 30 to 40 minutes after you take Fosamax.    GO TO THE FRONT DESK, PLEASE SCHEDULE YOUR APPOINTMENTS Come back for   a checkup in 3 months    STOP BY THE FIRST FLOOR:  get the XR     Fosamax or alendronate Tablets  What is this medication? ALENDRONATE (a LEN droe nate) prevents and treats osteoporosis. It may also be used to treat Paget disease of the bone. It works by Interior and spatial designer stronger and less likely to break (fracture). It belongs to a group of medications called bisphosphonates. This medicine may be used for other purposes; ask your health care provider or pharmacist if you have questions. COMMON BRAND NAME(S): Fosamax What should I tell my care team before I take this medication? They need to know if you have any of these conditions: Bleeding disorder Cancer Dental disease Difficulty swallowing Infection (fever, chills, cough, sore throat, pain or trouble passing urine) Kidney disease Low levels of calcium or other minerals in the blood Low red blood cell counts Receiving steroids like dexamethasone or prednisone Stomach or intestine problems Trouble sitting or standing for 30 minutes An unusual or allergic reaction to alendronate, other medications, foods, dyes or preservatives Pregnant or trying to get pregnant Breast-feeding How should I use this medication? Take this medication by mouth with a full glass of water. Take it as directed on the prescription label at the same time every day. Take the dose right after waking up. Do not eat or drink anything before taking it. Do not take it with any other  drink except water. Do not chew or crush the tablet. After taking it, do not eat breakfast, drink, or take any other medications or vitamins for at least 30 minutes. Sit or stand up for at least 30 minutes after you take it. Do not lie down. Keep taking it unless your care team tells you to stop. A special MedGuide will be given to you by the pharmacist with each prescription and refill. Be sure to read this information carefully each time. Talk to your care team about the use of this medication in children. Special care may be needed. Overdosage: If you think you have taken too much of this medicine contact a poison control center or emergency room at once. NOTE: This medicine is only for you. Do not share this medicine with others. What if I miss a dose? If you take your medication once a day, skip it. Take your next dose at the scheduled time the next morning. Do not take two doses on the same day. If you take your medication once a week, take the missed dose on the morning after you remember. Do not take two doses on the same day. What may interact with this medication? Aluminum hydroxide Antacids Aspirin Calcium supplements Medications for inflammation like ibuprofen, naproxen, and others Iron supplements Magnesium supplements Vitamins with minerals This list may not describe all possible interactions. Give your health care provider a list of all the medicines, herbs, non-prescription drugs, or dietary supplements you use. Also tell them  if you smoke, drink alcohol, or use illegal drugs. Some items may interact with your medicine. What should I watch for while using this medication? Visit your care team for regular checks on your progress. It may be some time before you see the benefit from this medication. Some people who take this medication have severe bone, joint, or muscle pain. This medication may also increase your risk for jaw problems or a broken thigh bone. Tell your care team  right away if you have severe pain in your jaw, bones, joints, or muscles. Tell you care team if you have any pain that does not go away or that gets worse. Tell your dentist and dental surgeon that you are taking this medication. You should not have major dental surgery while on this medication. See your dentist to have a dental exam and fix any dental problems before starting this medication. Take good care of your teeth while on this medication. Make sure you see your dentist for regular follow-up appointments. You should make sure you get enough calcium and vitamin D while you are taking this medication. Discuss the foods you eat and the vitamins you take with your care team. You may need blood work done while you are taking this medication. What side effects may I notice from receiving this medication? Side effects that you should report to your care team as soon as possible: Allergic reactions--skin rash, itching, hives, swelling of the face, lips, tongue, or throat Low calcium level--muscle pain or cramps, confusion, tingling, or numbness in the hands or feet Osteonecrosis of the jaw--pain, swelling, or redness in the mouth, numbness of the jaw, poor healing after dental work, unusual discharge from the mouth, visible bones in the mouth Pain or trouble swallowing Severe bone, joint, or muscle pain Stomach bleeding--bloody or black, tar-like stools, vomiting blood or brown material that looks like coffee grounds Side effects that usually do not require medical attention (report to your care team if they continue or are bothersome): Constipation Diarrhea Nausea Stomach pain This list may not describe all possible side effects. Call your doctor for medical advice about side effects. You may report side effects to FDA at 1-800-FDA-1088. Where should I keep my medication? Keep out of the reach of children and pets. Store at room temperature between 15 and 30 degrees C (59 and 86 degrees F).  Throw away any unused medication after the expiration date. NOTE: This sheet is a summary. It may not cover all possible information. If you have questions about this medicine, talk to your doctor, pharmacist, or health care provider.  2023 Elsevier/Gold Standard (2007-04-02 00:00:00)

## 2021-10-28 NOTE — Progress Notes (Unsigned)
Subjective:    Patient ID: Andrea Martin, female    DOB: [birth year], 86 y.o.   MRN: [id number]  DOS:  10/28/2021 Type of visit - description: follow-up  MCI: Took Aricept temporarily, reportedly got "more dizzy than usual" while  taking it. Overall however she thinks "her mind" is much better.  C/o dizziness, described as a unsteady feeling, worse when she gets up  from lying down. No associated strokelike symptoms.  Also, she was pulling something from a shelf, in the process lost her equilibrium and fell, left chest landed on the bed frame.  Went to the ER, notes reviewed.  Currently continues to be sore at the left chest but denies shortness of breath.   Wt Readings from Last 3 Encounters:  10/28/21 135 lb 2 oz (61.3 kg)  10/22/21 135 lb 9.3 oz (61.5 kg)  06/20/21 135 lb 8 oz (61.5 kg)     Review of Systems See above   Past Medical History:  Diagnosis Date   Anxiety and depression    Gait disorder    History of fractures due to falls 09/01/2020   HTN (hypertension) 09/01/2020   Hyperlipidemia    Neuropathy 08/04/2013   Shoulder dislocation    h/o after a fall    Past Surgical History:  Procedure Laterality Date   NO PAST SURGERIES      Current Outpatient Medications  Medication Instructions   acetaminophen (TYLENOL) 1,000 mg, Oral, Every 6 hours   ALPRAZolam (XANAX) 0.5 MG tablet TAKE 1/2 - 1 TABLET BY MOUTH 3 TIMES DAILY AS NEEDED FOR ANXIETY   azelastine (ASTELIN) 0.1 % nasal spray 2 sprays, Each Nare, At bedtime PRN, Use in each nostril as directed   buPROPion (WELLBUTRIN SR) 300 mg, Oral, Daily   Calcium Carb-Cholecalciferol (CALCIUM 600+D3) 600-800 MG-UNIT TABS 1 tablet, Oral, Every morning   diclofenac (FLECTOR) 1.3 % PTCH 1 patch, Transdermal, 2 times daily   donepezil (ARICEPT) 5 mg, Oral, Daily at bedtime   escitalopram (LEXAPRO) 20 mg, Oral, Daily   Fish Oil 600 mg, Oral, Every morning   GLUCOSAMINE-CHONDROITIN PO 1 tablet, Oral, Every morning,  Glucosamine 1500 mg, chondroitin 1200 mg   losartan (COZAAR) 50 mg, Oral, Daily   meclizine (ANTIVERT) 12.5 mg, Oral, 3 times daily PRN   Multiple Vitamin (MULTIVITAMIN WITH MINERALS) TABS tablet 1 tablet, Oral, Every morning   polyethylene glycol powder (GLYCOLAX/MIRALAX) 17 GM/SCOOP powder Mix 1 capful in 8 oz of liquid and drink everyday   pravastatin (PRAVACHOL) 20 MG tablet TAKE 1 TABLET BY MOUTH AT  BEDTIME   senna-docusate (SENOKOT-S) 8.6-50 MG tablet 1 tablet, Oral, 2 times daily   Tdap (BOOSTRIX) 5-2.5-18.5 LF-MCG/0.5 injection 0.5 mLs, Intramuscular,  Once   Vitamin D3 2,000 Units, Oral, Every morning       Objective:   Physical Exam BP 138/68   Pulse 78   Temp 98.3 F (36.8 C) (Oral)   Resp 16   Ht 5\' 6"  (1.676 m)   Wt 135 lb 2 oz (61.3 kg)   SpO2 94%   BMI 21.81 kg/m  General:   Well developed, NAD, BMI noted. HEENT:  Normocephalic . Face symmetric, atraumatic Lungs:  CTA B Normal respiratory effort, no intercostal retractions, no accessory muscle use. Heart: RRR,  no murmur. Chest wall: Left lateral chest is slightly TTP, she has a bruise at that area. Lower extremities: no pretibial edema bilaterally  Skin: Not pale. Not jaundice Neurologic:  alert & oriented X3.  Speech normal,  gait appropriate for age and unassisted Psych--  Cognition and judgment appear intact.  Cooperative with normal attention span and concentration.  Behavior appropriate. No anxious or depressed appearing.      Assessment     Assessment   HTN: DX 10/2019. Hyperlipidemia.  Simvastatin intolerant.  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 Gait d/o, falls, referred to PT 2014 Cerebellar: Remote infarct per MRI 10-2018 Osteopenia: T score -1.6 10-2018  PLAN: MCI: See LOV, started Aricept, took it  temporarily, dizziness got a slightly worse so she stopped.  She thinks she is actually better. I also interviewed the daughter, she reports that Andrea Martin still has good days and bad days and is confused at times. Options discussed, rechallenged with Aricept versus change to Namenda.  We agreed to try Aricept again, if dizziness resurface she will let me know. Dizziness: On chart review, this is not a new concern or symptom.  See note from October 2021.  Plan: Observation. Mechanical fall, went to the ER 10/22/2021, she slipped and fell on her left chest, she was TTP there, x-rays show no left rib fractures but rather right rib fractures.  At this point pain is not a major issue, to be sure we will repeat her x-ray. Fall prevention discussed. Osteoporosis: Last T score 2020, -1.6,  in light of current and previous rib fractures I think is appropriate to start Fosamax, precautions discussed.  She and the daughter who was present during the visit verbalized understanding Vaccine advice provided, see AVS. RTC 3 months   Time spent 39 min

## 2021-10-29 NOTE — Assessment & Plan Note (Signed)
MCI: See LOV, started Aricept, took it temporarily, dizziness got a slightly worse so she stopped.  She thinks she is actually better. I also interviewed the daughter, she reports that [place] still has good days and bad days and is confused at times. Options discussed, rechallenged with Aricept versus change to Namenda.  We agreed to try Aricept again, if dizziness resurface she will let me know. Dizziness: On chart review, this is not a new concern or symptom.  See note from October 2021.  Plan: Observation. Mechanical fall, went to the ER 10/22/2021, she slipped and fell on her left chest, she was TTP there, x-rays show no left rib fractures but rather right rib fractures.  At this point pain is not a major issue, to be sure we will repeat her x-ray. Fall prevention discussed. Osteoporosis: Last T score 2020, -1.6,  in light of current and previous rib fractures I think is appropriate to start Fosamax, precautions discussed.  She and the daughter who was present during the visit verbalized understanding Vaccine advice provided, see AVS. RTC 3 months

## 2021-11-10 ENCOUNTER — Other Ambulatory Visit: Payer: Self-pay | Admitting: Internal Medicine

## 2021-11-17 ENCOUNTER — Ambulatory Visit (INDEPENDENT_AMBULATORY_CARE_PROVIDER_SITE_OTHER): Payer: Medicare Other | Admitting: *Deleted

## 2021-11-17 DIAGNOSIS — Z0000 Encounter for general adult medical examination without abnormal findings: Secondary | ICD-10-CM

## 2021-11-17 NOTE — Progress Notes (Cosign Needed)
Subjective:   Andrea StandardShirley M Martin is a 86 y.o. female who presents for Medicare Annual (Subsequent) preventive examination.  I connected with  Andrea Martin on 11/17/21 by a audio enabled telemedicine application and verified that I am speaking with the correct person using two identifiers.  Patient Location: Home  Provider Location: Office/Clinic  I discussed the limitations of evaluation and management by telemedicine. The patient expressed understanding and agreed to proceed.   Review of Systems    Defer to PCP Cardiac Risk Factors include: advanced age (>755men, 4>65 women);hypertension;dyslipidemia     Objective:    There were no vitals filed for this visit. There is no height or weight on file to calculate BMI.     11/17/2021    3:43 PM 10/22/2021    4:48 PM 09/14/2020    1:41 PM 08/31/2020    8:48 PM 08/28/2020    2:27 PM 08/28/2019   12:28 PM 08/17/2019   10:21 AM  Advanced Directives  Does Patient Have a Medical Advance Directive? Yes No No No No Yes Yes  Type of Estate agentAdvance Directive Healthcare Power of [place];Living will     Healthcare Power of [place];Living will Healthcare Power of [place];Living will  Does patient want to make changes to medical advance directive? No - Patient declined     No - Patient declined No - Patient declined  Copy of Healthcare Power of Attorney in Chart? No - copy requested     No - copy requested No - copy requested  Would patient like information on creating a medical advance directive?  No - Patient declined  Yes (ED - Information included in AVS) No - Patient declined      Current Medications (verified) Outpatient Encounter Medications as of 11/17/2021  Medication Sig   acetaminophen (TYLENOL) 500 MG tablet Take 2 tablets (1,000 mg total) by mouth every 6 (six) hours.   alendronate (FOSAMAX) 70 MG tablet Take 1 tablet (70 mg total) by mouth every 7 (seven) days. Take with a full glass of water on an empty stomach.   ALPRAZolam (XANAX) 0.5 MG  tablet TAKE 1/2 - 1 TABLET BY MOUTH 3 TIMES DAILY AS NEEDED FOR ANXIETY   azelastine (ASTELIN) 0.1 % nasal spray Place 2 sprays into both nostrils at bedtime as needed for rhinitis. Use in each nostril as directed   buPROPion (WELLBUTRIN SR) 150 MG 12 hr tablet Take 2 tablets (300 mg total) by mouth daily.   Calcium Carb-Cholecalciferol (CALCIUM 600+D3) 600-800 MG-UNIT TABS Take 1 tablet by mouth every morning.   Cholecalciferol (VITAMIN D3) 50 MCG (2000 UT) TABS Take 2,000 Units by mouth every morning.   diclofenac (FLECTOR) 1.3 % PTCH Place 1 patch onto the skin 2 (two) times daily.   donepezil (ARICEPT) 5 MG tablet Take 1 tablet (5 mg total) by mouth at bedtime.   escitalopram (LEXAPRO) 20 MG tablet Take 1 tablet (20 mg total) by mouth daily.   GLUCOSAMINE-CHONDROITIN PO Take 1 tablet by mouth every morning. Glucosamine 1500 mg, chondroitin 1200 mg   losartan (COZAAR) 50 MG tablet Take 1 tablet (50 mg total) by mouth daily.   meclizine (ANTIVERT) 12.5 MG tablet Take 1 tablet (12.5 mg total) by mouth 3 (three) times daily as needed for dizziness. (Patient not taking: Reported on 04/22/2021)   Multiple Vitamin (MULTIVITAMIN WITH MINERALS) TABS tablet Take 1 tablet by mouth every morning.   Omega-3 Fatty Acids (FISH OIL) 600 MG CAPS Take 600 mg by mouth every morning.  polyethylene glycol powder (GLYCOLAX/MIRALAX) 17 GM/SCOOP powder Mix 1 capful in 8 oz of liquid and drink everyday   pravastatin (PRAVACHOL) 20 MG tablet TAKE 1 TABLET BY MOUTH AT  BEDTIME   senna-docusate (SENOKOT-S) 8.6-50 MG tablet Take 1 tablet by mouth 2 (two) times daily. (Patient not taking: Reported on 06/20/2021)   Tdap (BOOSTRIX) 5-2.5-18.5 LF-MCG/0.5 injection Inject into the muscle.   No facility-administered encounter medications on file as of 11/17/2021.    Allergies (verified) Codeine, Sulfa antibiotics, and Penicillins   History: Past Medical History:  Diagnosis Date   Anxiety and depression    Gait disorder     History of fractures due to falls 09/01/2020   HTN (hypertension) 09/01/2020   Hyperlipidemia    Neuropathy 08/04/2013   Shoulder dislocation    h/o after a fall   Past Surgical History:  Procedure Laterality Date   NO PAST SURGERIES     Family History  Problem Relation Age of Onset   Heart disease Father        CAD?   Coronary artery disease Brother 50   Heart disease Mother    Breast cancer Neg Hx    Colon cancer Neg Hx    Diabetes Neg Hx    Stroke Neg Hx    Social History   Socioeconomic History   Marital status: Widowed    Spouse name: Not on file   Number of children: 1   Years of education: Not on file   Highest education level: Not on file  Occupational History   Occupation: retired  Tobacco Use   Smoking status: Never   Smokeless tobacco: Never  Vaping Use   Vaping Use: Never used  Substance and Sexual Activity   Alcohol use: No   Drug use: No   Sexual activity: Not on file  Other Topics Concern   Not on file  Social History Narrative   Loss of her husband Elnita Maxwell 03/31/2021   Share the house w/ daughter and one of her 2 children    Social Determinants of Health   Financial Resource Strain: Low Risk  (08/28/2020)   Overall Financial Resource Strain (CARDIA)    Difficulty of Paying Living Expenses: Not hard at all  Food Insecurity: No Food Insecurity (08/28/2020)   Hunger Vital Sign    Worried About Running Out of Food in the Last Year: Never true    Ran Out of Food in the Last Year: Never true  Transportation Needs: No Transportation Needs (08/28/2020)   PRAPARE - Administrator, Civil Service (Medical): No    Lack of Transportation (Non-Medical): No  Physical Activity: Inactive (08/28/2020)   Exercise Vital Sign    Days of Exercise per Week: 0 days    Minutes of Exercise per Session: 0 min  Stress: No Stress Concern Present (08/28/2020)   [employer] of Occupational Health - Occupational Stress Questionnaire    Feeling of Stress : Not at  all  Social Connections: Socially Integrated (08/28/2020)   Social Connection and Isolation Panel [NHANES]    Frequency of Communication with Friends and Family: More than three times a week    Frequency of Social Gatherings with Friends and Family: More than three times a week    Attends Religious Services: 1 to 4 times per year    Active Member of [employer] or Organizations: Yes    Attends Banker Meetings: 1 to 4 times per year    Marital Status: Married  Tobacco Counseling Counseling given: Not Answered   Clinical Intake:  Pre-visit preparation completed: Yes  Pain : No/denies pain     Diabetes: No  How often do you need to have someone help you when you read instructions, pamphlets, or other written materials from your doctor or pharmacy?: 1 - Never  Diabetic? No   Activities of Daily Living    11/17/2021    3:44 PM  In your present state of health, do you have any difficulty performing the following activities:  Hearing? 1  Comment has noticed some hearing loss  Vision? 0  Difficulty concentrating or making decisions? 1  Comment memory  Walking or climbing stairs? 1  Dressing or bathing? 0  Doing errands, shopping? 0  Preparing Food and eating ? N  Using the Toilet? N  In the past six months, have you accidently leaked urine? Y  Do you have problems with loss of bowel control? N  Managing your Medications? N  Managing your Finances? N  Housekeeping or managing your Housekeeping? N    Patient Care Team: Wanda Plump, MD as PCP - General (Internal Medicine) Ranee Gosselin, MD as Consulting Physician (Orthopedic Surgery) Vida Rigger, MD as Consulting Physician (Gastroenterology)  Indicate any recent Medical Services you may have received from other than [facility] providers in the past year (date may be approximate).     Assessment:   This is a routine wellness examination for [place].  Hearing/Vision screen No results found.  Dietary issues and  exercise activities discussed: Current Exercise Habits: The patient does not participate in regular exercise at present, Exercise limited by: None identified   Goals Addressed   None    Depression Screen    11/17/2021    3:43 PM 10/28/2021    4:02 PM 04/22/2021    4:34 PM 08/28/2020    2:31 PM 07/24/2020    3:27 PM 05/03/2020    4:31 PM 11/07/2019    9:11 AM  PHQ 2/9 Scores  PHQ - 2 Score 2 2 2 1 4 1 1   PHQ- 9 Score  8 8  15 3 1     Fall Risk    11/17/2021    3:41 PM 10/28/2021    4:08 PM 04/22/2021    3:52 PM 08/28/2020    2:29 PM 05/03/2020    4:10 PM  Fall Risk   Falls in the past year? 1 1 1 1  0  Number falls in past yr: 1 1 1 1  0  Injury with Fall? 0 1 0 0 0  Risk for fall due to : History of fall(s)   History of fall(s)   Follow up Falls evaluation completed Falls evaluation completed Falls evaluation completed Falls prevention discussed     FALL RISK PREVENTION PERTAINING TO THE HOME:  Any stairs in or around the home? Yes  If so, are there any without handrails? No  Home free of loose throw rugs in walkways, pet beds, electrical cords, etc? Yes  Adequate lighting in your home to reduce risk of falls? Yes   ASSISTIVE DEVICES UTILIZED TO PREVENT FALLS:  Life alert? Yes  Use of a cane, walker or w/c? No  Grab bars in the bathroom? Yes  Shower chair or bench in shower? Yes  Elevated toilet seat or a handicapped toilet? No   TIMED UP AND GO:  Was the test performed? No .  Audio visit   Cognitive Function:    06/20/2021    4:37 PM  MMSE -  Mini Mental State Exam  Orientation to time 4  Orientation to Place 5  Registration 3  Attention/ Calculation 5  Recall 0  Language- name 2 objects 2  Language- repeat 1  Language- follow 3 step command 2  Language- read & follow direction 1  Write a sentence 1  Copy design 1  Total score 25        11/17/2021    3:55 PM  6CIT Screen  What Year? 0 points  What month? 0 points  What time? 0 points  Count back from 20 0  points  Months in reverse 0 points  Repeat phrase 6 points  Total Score 6 points    Immunizations Immunization History  Administered Date(s) Administered   Fluad Quad(high Dose 65+) 12/19/2019   Influenza Split 12/25/2011, 11/19/2018   Influenza, High Dose Seasonal PF 12/09/2012, 11/29/2015   Influenza,inj,Quad PF,6+ Mos 12/08/2013   Influenza-Unspecified 12/24/2014, 12/10/2016, 12/29/2017   PFIZER Comirnaty(Gray Top)Covid-19 Tri-Sucrose Vaccine 07/30/2020   PFIZER(Purple Top)SARS-COV-2 Vaccination 03/16/2019, 04/06/2019, 02/22/2020   Pneumococcal Conjugate-13 08/04/2013   Pneumococcal Polysaccharide-23 01/23/2010, 05/04/2018   Td 04/27/2011   Zoster Recombinat (Shingrix) 11/03/2018, 01/13/2019   Zoster, Live 04/23/2009    TDAP status: Due, Education has been provided regarding the importance of this vaccine. Advised may receive this vaccine at local pharmacy or Health Dept. Aware to provide a copy of the vaccination record if obtained from local pharmacy or Health Dept. Verbalized acceptance and understanding.  Flu Vaccine status: Due, Education has been provided regarding the importance of this vaccine. Advised may receive this vaccine at local pharmacy or Health Dept. Aware to provide a copy of the vaccination record if obtained from local pharmacy or Health Dept. Verbalized acceptance and understanding.  Pneumococcal vaccine status: Up to date  Covid-19 vaccine status: Information provided on how to obtain vaccines.   Qualifies for Shingles Vaccine? Yes   Zostavax completed Yes   Shingrix Completed?: Yes  Screening Tests Health Maintenance  Topic Date Due   COVID-19 Vaccine (5 - Pfizer risk series) 09/24/2020   TETANUS/TDAP  04/26/2021   INFLUENZA VACCINE  05/24/2022 (Originally 09/23/2021)   Pneumonia Vaccine 89+ Years old  Completed   DEXA SCAN  Completed   Zoster Vaccines- Shingrix  Completed   HPV VACCINES  Aged Out    Health Maintenance  Health Maintenance Due   Topic Date Due   COVID-19 Vaccine (5 - Pfizer risk series) 09/24/2020   TETANUS/TDAP  04/26/2021    Colorectal cancer screening: No longer required.   Mammogram status: No longer required due to age.  Bone Density status: Completed 11/16/18. Results reflect: Bone density results: OSTEOPENIA. Repeat every 2 years.  Lung Cancer Screening: (Low Dose CT Chest recommended if Age 42-80 years, 30 pack-year currently smoking OR have quit w/in 15years.) does not qualify.   Lung Cancer Screening Referral: N/a  Additional Screening:  Hepatitis C Screening: does not qualify; Completed N/a  Vision Screening: Recommended annual ophthalmology exams for early detection of glaucoma and other disorders of the eye. Is the patient up to date with their annual eye exam?  Yes  Who is the provider or what is the name of the office in which the patient attends annual eye exams? [facility] If pt is not established with a provider, would they like to be referred to a provider to establish care? No .   Dental Screening: Recommended annual dental exams for proper oral hygiene  Community Resource Referral / Chronic Care Management: CRR required  this visit?  No   CCM required this visit?  No      Plan:     I have personally reviewed and noted the following in the patient's chart:   Medical and social history Use of alcohol, tobacco or illicit drugs  Current medications and supplements including opioid prescriptions. Patient is not currently taking opioid prescriptions. Functional ability and status Nutritional status Physical activity Advanced directives List of other physicians Hospitalizations, surgeries, and ER visits in previous 12 months Vitals Screenings to include cognitive, depression, and falls Referrals and appointments  In addition, I have reviewed and discussed with patient certain preventive protocols, quality metrics, and best practice recommendations. A written personalized  care plan for preventive services as well as general preventive health recommendations were provided to patient.   Due to this being a telephonic visit, the after visit summary with patients personalized plan was offered to patient via mail or my-chart.  Patient would like to access on my-chart.  Beatris Ship, [place]   11/17/2021   Nurse Notes: None    I have reviewed and agree with [facility] documentation.  Kathlene November, MD

## 2021-11-17 NOTE — Patient Instructions (Signed)
[name] , Thank you for taking time to come for your Medicare Wellness Visit. I appreciate your ongoing commitment to your health goals. Please review the following plan we discussed and let me know if I can assist you in the future.   These are the goals we discussed:  Goals      Decrease pepsi to 1 per day     DIET - INCREASE WATER INTAKE     Patient Stated     Increase activity        This is a list of the screening recommended for you and due dates:  Health Maintenance  Topic Date Due   COVID-19 Vaccine (5 - Pfizer risk series) 09/24/2020   Tetanus Vaccine  04/26/2021   Flu Shot  05/24/2022*   Pneumonia Vaccine  Completed   DEXA scan (bone density measurement)  Completed   Zoster (Shingles) Vaccine  Completed   HPV Vaccine  Aged Out  *Topic was postponed. The date shown is not the original due date.      Next appointment: Follow up in one year for your annual wellness visit    Preventive Care 65 Years and Older, Female Preventive care refers to lifestyle choices and visits with your health care provider that can promote health and wellness. What does preventive care include? A yearly physical exam. This is also called an annual well check. Dental exams once or twice a year. Routine eye exams. Ask your health care provider how often you should have your eyes checked. Personal lifestyle choices, including: Daily care of your teeth and gums. Regular physical activity. Eating a healthy diet. Avoiding tobacco and drug use. Limiting alcohol use. Practicing safe sex. Taking low-dose aspirin every day. Taking vitamin and mineral supplements as recommended by your health care provider. What happens during an annual well check? The services and screenings done by your health care provider during your annual well check will depend on your age, overall health, lifestyle risk factors, and family history of disease. Counseling  Your health care provider may ask you questions  about your: Alcohol use. Tobacco use. Drug use. Emotional well-being. Home and relationship well-being. Sexual activity. Eating habits. History of falls. Memory and ability to understand (cognition). Work and work Statistician. Reproductive health. Screening  You may have the following tests or measurements: Height, weight, and BMI. Blood pressure. Lipid and cholesterol levels. These may be checked every 5 years, or more frequently if you are over 61 years old. Skin check. Lung cancer screening. You may have this screening every year starting at age 94 if you have a 30-pack-year history of smoking and currently smoke or have quit within the past 15 years. Fecal occult blood test (FOBT) of the stool. You may have this test every year starting at age 66. Flexible sigmoidoscopy or colonoscopy. You may have a sigmoidoscopy every 5 years or a colonoscopy every 10 years starting at age 64. Hepatitis C blood test. Hepatitis B blood test. Sexually transmitted disease (STD) testing. Diabetes screening. This is done by checking your blood sugar (glucose) after you have not eaten for a while (fasting). You may have this done every 1-3 years. Bone density scan. This is done to screen for osteoporosis. You may have this done starting at age 26. Mammogram. This may be done every 1-2 years. Talk to your health care provider about how often you should have regular mammograms. Talk with your health care provider about your test results, treatment options, and if necessary, the  need for more tests. Vaccines  Your health care provider may recommend certain vaccines, such as: Influenza vaccine. This is recommended every year. Tetanus, diphtheria, and acellular pertussis (Tdap, Td) vaccine. You may need a Td booster every 10 years. Zoster vaccine. You may need this after age 77. Pneumococcal 13-valent conjugate (PCV13) vaccine. One dose is recommended after age 74. Pneumococcal polysaccharide (PPSV23)  vaccine. One dose is recommended after age 52. Talk to your health care provider about which screenings and vaccines you need and how often you need them. This information is not intended to replace advice given to you by your health care provider. Make sure you discuss any questions you have with your health care provider. Document Released: 03/08/2015 Document Revised: 10/30/2015 Document Reviewed: 12/11/2014 Elsevier Interactive Patient Education  2017 [employer].  Fall Prevention in the Home Falls can cause injuries. They can happen to people of all ages. There are many things you can do to make your home safe and to help prevent falls. What can I do on the outside of my home? Regularly fix the edges of walkways and driveways and fix any cracks. Remove anything that might make you trip as you walk through a door, such as a raised step or threshold. Trim any bushes or trees on the path to your home. Use bright outdoor lighting. Clear any walking paths of anything that might make someone trip, such as rocks or tools. Regularly check to see if handrails are loose or broken. Make sure that both sides of any steps have handrails. Any raised decks and porches should have guardrails on the edges. Have any leaves, snow, or ice cleared regularly. Use sand or salt on walking paths during winter. Clean up any spills in your garage right away. This includes oil or grease spills. What can I do in the bathroom? Use night lights. Install grab bars by the toilet and in the tub and shower. Do not use towel bars as grab bars. Use non-skid mats or decals in the tub or shower. If you need to sit down in the shower, use a plastic, non-slip stool. Keep the floor dry. Clean up any water that spills on the floor as soon as it happens. Remove soap buildup in the tub or shower regularly. Attach bath mats securely with double-sided non-slip rug tape. Do not have throw rugs and other things on the floor that  can make you trip. What can I do in the bedroom? Use night lights. Make sure that you have a light by your bed that is easy to reach. Do not use any sheets or blankets that are too big for your bed. They should not hang down onto the floor. Have a firm chair that has side arms. You can use this for support while you get dressed. Do not have throw rugs and other things on the floor that can make you trip. What can I do in the kitchen? Clean up any spills right away. Avoid walking on wet floors. Keep items that you use a lot in easy-to-reach places. If you need to reach something above you, use a strong step stool that has a grab bar. Keep electrical cords out of the way. Do not use floor polish or wax that makes floors slippery. If you must use wax, use non-skid floor wax. Do not have throw rugs and other things on the floor that can make you trip. What can I do with my stairs? Do not leave any items on the  stairs. Make sure that there are handrails on both sides of the stairs and use them. Fix handrails that are broken or loose. Make sure that handrails are as long as the stairways. Check any carpeting to make sure that it is firmly attached to the stairs. Fix any carpet that is loose or worn. Avoid having throw rugs at the top or bottom of the stairs. If you do have throw rugs, attach them to the floor with carpet tape. Make sure that you have a light switch at the top of the stairs and the bottom of the stairs. If you do not have them, ask someone to add them for you. What else can I do to help prevent falls? Wear shoes that: Do not have high heels. Have rubber bottoms. Are comfortable and fit you well. Are closed at the toe. Do not wear sandals. If you use a stepladder: Make sure that it is fully opened. Do not climb a closed stepladder. Make sure that both sides of the stepladder are locked into place. Ask someone to hold it for you, if possible. Clearly mark and make sure that you  can see: Any grab bars or handrails. First and last steps. Where the edge of each step is. Use tools that help you move around (mobility aids) if they are needed. These include: Canes. Walkers. Scooters. Crutches. Turn on the lights when you go into a dark area. Replace any light bulbs as soon as they burn out. Set up your furniture so you have a clear path. Avoid moving your furniture around. If any of your floors are uneven, fix them. If there are any pets around you, be aware of where they are. Review your medicines with your doctor. Some medicines can make you feel dizzy. This can increase your chance of falling. Ask your doctor what other things that you can do to help prevent falls. This information is not intended to replace advice given to you by your health care provider. Make sure you discuss any questions you have with your health care provider. Document Released: 12/06/2008 Document Revised: 07/18/2015 Document Reviewed: 03/16/2014 Elsevier Interactive Patient Education  2017 [employer].

## 2021-12-19 ENCOUNTER — Other Ambulatory Visit (HOSPITAL_BASED_OUTPATIENT_CLINIC_OR_DEPARTMENT_OTHER): Payer: Self-pay

## 2021-12-19 MED ORDER — COMIRNATY 30 MCG/0.3ML IM SUSY
PREFILLED_SYRINGE | INTRAMUSCULAR | 0 refills | Status: DC
  Filled 2021-12-19: qty 0.3, 1d supply, fill #0

## 2021-12-19 MED ORDER — FLUAD QUADRIVALENT 0.5 ML IM PRSY
PREFILLED_SYRINGE | INTRAMUSCULAR | 0 refills | Status: DC
Start: 2021-12-19 — End: 2022-02-11
  Filled 2021-12-19: qty 0.5, 1d supply, fill #0

## 2022-01-27 ENCOUNTER — Ambulatory Visit: Payer: Medicare Other | Admitting: Internal Medicine

## 2022-02-11 ENCOUNTER — Encounter: Payer: Self-pay | Admitting: Internal Medicine

## 2022-02-11 ENCOUNTER — Ambulatory Visit (INDEPENDENT_AMBULATORY_CARE_PROVIDER_SITE_OTHER): Payer: Medicare Other | Admitting: Internal Medicine

## 2022-02-11 VITALS — BP 126/70 | HR 76 | Temp 98.1°F | Resp 16 | Ht 66.0 in | Wt 135.1 lb

## 2022-02-11 DIAGNOSIS — R269 Unspecified abnormalities of gait and mobility: Secondary | ICD-10-CM | POA: Diagnosis not present

## 2022-02-11 DIAGNOSIS — R7989 Other specified abnormal findings of blood chemistry: Secondary | ICD-10-CM

## 2022-02-11 DIAGNOSIS — E785 Hyperlipidemia, unspecified: Secondary | ICD-10-CM

## 2022-02-11 DIAGNOSIS — I10 Essential (primary) hypertension: Secondary | ICD-10-CM | POA: Diagnosis not present

## 2022-02-11 DIAGNOSIS — F32A Depression, unspecified: Secondary | ICD-10-CM | POA: Diagnosis not present

## 2022-02-11 DIAGNOSIS — R296 Repeated falls: Secondary | ICD-10-CM

## 2022-02-11 DIAGNOSIS — Z79899 Other long term (current) drug therapy: Secondary | ICD-10-CM | POA: Diagnosis not present

## 2022-02-11 DIAGNOSIS — E538 Deficiency of other specified B group vitamins: Secondary | ICD-10-CM

## 2022-02-11 DIAGNOSIS — F419 Anxiety disorder, unspecified: Secondary | ICD-10-CM

## 2022-02-11 NOTE — Patient Instructions (Addendum)
Vaccines I recommend:  RSV vaccine  Check the  blood pressure regularly BP GOAL is between 110/65 and  135/85. If it is consistently higher or lower, let me know     GO TO THE LAB : Get the blood work     GO TO THE FRONT DESK, PLEASE SCHEDULE YOUR APPOINTMENTS Come back for a a checkup in 5 to 6 months

## 2022-02-11 NOTE — Progress Notes (Unsigned)
Subjective:    Patient ID: Andrea Martin, female    DOB: 02-05-1936, 86 y.o.   MRN: [id number]  DOS:  02/11/2022 Type of visit - description: Follow-up  Since the last office visit is doing well. Taking Aricept, no apparent side effects.  No dizziness. She had "2 or 3 falls", usually tripping on something, no LOC.  Denies headache or neck pain. Tremors are getting slightly worse, mostly in her hands, affecting her writing sometimes.  Review of Systems See above   Past Medical History:  Diagnosis Date   Anxiety and depression    Gait disorder    History of fractures due to falls 09/01/2020   HTN (hypertension) 09/01/2020   Hyperlipidemia    Neuropathy 08/04/2013   Shoulder dislocation    h/o after a fall    Past Surgical History:  Procedure Laterality Date   NO PAST SURGERIES      Current Outpatient Medications  Medication Instructions   acetaminophen (TYLENOL) 1,000 mg, Oral, Every 6 hours   alendronate (FOSAMAX) 70 mg, Oral, Every 7 days, Take with a full glass of water on an empty stomach.   ALPRAZolam (XANAX) 0.5 MG tablet TAKE 1/2 - 1 TABLET BY MOUTH 3 TIMES DAILY AS NEEDED FOR ANXIETY   azelastine (ASTELIN) 0.1 % nasal spray 2 sprays, Each Nare, At bedtime PRN, Use in each nostril as directed   buPROPion (WELLBUTRIN SR) 300 mg, Oral, Daily   Calcium Carb-Cholecalciferol (CALCIUM 600+D3) 600-800 MG-UNIT TABS 1 tablet, Oral, Every morning   donepezil (ARICEPT) 5 mg, Oral, Daily at bedtime   escitalopram (LEXAPRO) 20 mg, Oral, Daily   Fish Oil 600 mg, Oral, Every morning   GLUCOSAMINE-CHONDROITIN PO 1 tablet, Oral, Every morning, Glucosamine 1500 mg, chondroitin 1200 mg   losartan (COZAAR) 50 mg, Oral, Daily   meclizine (ANTIVERT) 12.5 mg, Oral, 3 times daily PRN   Multiple Vitamin (MULTIVITAMIN WITH MINERALS) TABS tablet 1 tablet, Oral, Every morning   polyethylene glycol powder (GLYCOLAX/MIRALAX) 17 GM/SCOOP powder Mix 1 capful in 8 oz of liquid and drink everyday    pravastatin (PRAVACHOL) 20 MG tablet TAKE 1 TABLET BY MOUTH AT  BEDTIME   senna-docusate (SENOKOT-S) 8.6-50 MG tablet 1 tablet, Oral, 2 times daily   Vitamin D3 2,000 Units, Oral, Every morning       Objective:   Physical Exam BP 126/70   Pulse 76   Temp 98.1 F (36.7 C) (Oral)   Resp 16   Ht 5\' 6"  (1.676 m)   Wt 135 lb 2 oz (61.3 kg)   SpO2 96%   BMI 21.81 kg/m  General:   Well developed, NAD, BMI noted. HEENT:  Normocephalic . Face symmetric, atraumatic Lungs:  CTA B Normal respiratory effort, no intercostal retractions, no accessory muscle use. Heart: RRR,  no murmur.  Lower extremities: no pretibial edema bilaterally  Skin: Not pale. Not jaundice Neurologic:  alert & oriented X3.  Speech normal, gait appropriate for age and unassisted.  Mild upper extremity tremors noted Psych--  Cognition and judgment appear intact.  Cooperative with normal attention span and concentration.  Behavior appropriate. No anxious or depressed appearing.      Assessment    Assessment   HTN: DX 10/2019. Hyperlipidemia.  Simvastatin intolerant.  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 Gait d/o, falls  Cerebellar:  Remote infarct per MRI 10-2018 Osteopenia: T score -1.6 10-2018  PLAN: HTN: On losartan, BP today is very good, check CMP. High cholesterol: On Pravachol, checking LFTs and a FLP.  Further advised with results Tremors: Slightly worse, h/o s/e w/ primidone.  Occasionally has difficulty writing.  Observation for now. Anxiety depression: On Wellbutrin and Lexapro, she seems in very good spirits today. MCI: See last visit, went back on Aricept, no apparent side effects. B12 deficiency: Checking labs Osteoporosis: Good compliance with Fosamax without apparent side effects Gait disorders: Still  having falls, fortunately no major consequences, referred to home PT, patient is homebound RTC 4 to 6 months

## 2022-02-12 LAB — COMPREHENSIVE METABOLIC PANEL
ALT: 10 U/L (ref 0–35)
AST: 16 U/L (ref 0–37)
Albumin: 3.7 g/dL (ref 3.5–5.2)
Alkaline Phosphatase: 54 U/L (ref 39–117)
BUN: 18 mg/dL (ref 6–23)
CO2: 29 mEq/L (ref 19–32)
Calcium: 9.7 mg/dL (ref 8.4–10.5)
Chloride: 107 mEq/L (ref 96–112)
Creatinine, Ser: 1.03 mg/dL (ref 0.40–1.20)
GFR: 49.78 mL/min — ABNORMAL LOW (ref >60.00)
Glucose, Bld: 78 mg/dL (ref 70–99)
Potassium: 4.3 mEq/L (ref 3.5–5.1)
Sodium: 143 mEq/L (ref 135–145)
Total Bilirubin: 0.5 mg/dL (ref 0.2–1.2)
Total Protein: 6.1 g/dL (ref 6.0–8.3)

## 2022-02-12 LAB — LIPID PANEL
Cholesterol: 183 mg/dL (ref 0–200)
HDL: 52.3 mg/dL (ref >39.00)
LDL Cholesterol: 109 mg/dL — ABNORMAL HIGH (ref 0–99)
NonHDL: 130.82
Total CHOL/HDL Ratio: 4
Triglycerides: 110 mg/dL (ref 0.0–149.0)
VLDL: 22 mg/dL (ref 0.0–40.0)

## 2022-02-12 LAB — TSH: TSH: 3.33 u[IU]/mL (ref 0.35–5.50)

## 2022-02-12 LAB — B12 AND FOLATE PANEL
Folate: >23.8 ng/mL (ref >5.9)
Vitamin B-12: 448 pg/mL (ref 211–911)

## 2022-02-12 LAB — T4, FREE: Free T4: 0.84 ng/dL (ref 0.60–1.60)

## 2022-02-12 NOTE — Assessment & Plan Note (Signed)
HTN: On losartan, BP today is very good, check CMP. High cholesterol: On Pravachol, checking LFTs and a FLP.  Further advised with results Tremors: Slightly worse, h/o s/e w/ primidone.  Occasionally has difficulty writing.  Observation for now. Anxiety depression: On Wellbutrin and Lexapro, she seems in very good spirits today. MCI: See last visit, went back on Aricept, no apparent side effects. B12 deficiency: Checking labs Osteoporosis: Good compliance with Fosamax without apparent side effects Gait disorders: Still having falls, fortunately no major consequences, referred to home PT, patient is homebound RTC 4 to 6 months

## 2022-02-15 ENCOUNTER — Other Ambulatory Visit: Payer: Self-pay | Admitting: Internal Medicine

## 2022-02-15 LAB — DRUG TOX MONITOR 1 W/CONF, ORAL FLD
Alprazolam: 2.3 ng/mL — ABNORMAL HIGH (ref <0.50)
Amphetamines: NEGATIVE ng/mL (ref <10)
Barbiturates: NEGATIVE ng/mL (ref <10)
Benzodiazepines: POSITIVE ng/mL — AB (ref <0.50)
Buprenorphine: NEGATIVE ng/mL (ref <0.10)
Chlordiazepoxide: NEGATIVE ng/mL (ref <0.50)
Clonazepam: NEGATIVE ng/mL (ref <0.50)
Cocaine: NEGATIVE ng/mL (ref <5.0)
Codeine: NEGATIVE ng/mL (ref <2.5)
Diazepam: NEGATIVE ng/mL (ref <0.50)
Dihydrocodeine: NEGATIVE ng/mL (ref <2.5)
Fentanyl: NEGATIVE ng/mL (ref <0.10)
Flunitrazepam: NEGATIVE ng/mL (ref <0.50)
Flurazepam: NEGATIVE ng/mL (ref <0.50)
Heroin Metabolite: NEGATIVE ng/mL (ref <1.0)
Hydrocodone: NEGATIVE ng/mL (ref <2.5)
Hydromorphone: NEGATIVE ng/mL (ref <2.5)
Lorazepam: NEGATIVE ng/mL (ref <0.50)
MARIJUANA: NEGATIVE ng/mL (ref <2.5)
MDMA: NEGATIVE ng/mL (ref <10)
Meprobamate: NEGATIVE ng/mL (ref <2.5)
Methadone: NEGATIVE ng/mL (ref <5.0)
Midazolam: NEGATIVE ng/mL (ref <0.50)
Morphine: NEGATIVE ng/mL (ref <2.5)
Nicotine Metabolite: NEGATIVE ng/mL (ref <5.0)
Nordiazepam: NEGATIVE ng/mL (ref <0.50)
Norhydrocodone: NEGATIVE ng/mL (ref <2.5)
Opiates: NEGATIVE ng/mL (ref <2.5)
Oxazepam: NEGATIVE ng/mL (ref <0.50)
Oxycodone: NEGATIVE ng/mL (ref <2.5)
Oxymorphone: NEGATIVE ng/mL (ref <2.5)
Phencyclidine: NEGATIVE ng/mL (ref <10)
Tapentadol: NEGATIVE ng/mL (ref <5.0)
Temazepam: NEGATIVE ng/mL (ref <0.50)
Tramadol: NEGATIVE ng/mL (ref <5.0)
Triazolam: NEGATIVE ng/mL (ref <0.50)
Zolpidem: NEGATIVE ng/mL (ref <5.0)

## 2022-02-25 ENCOUNTER — Telehealth: Payer: Self-pay | Admitting: Internal Medicine

## 2022-02-25 NOTE — Telephone Encounter (Signed)
[facility]) called stating that following:  Pt start of care date is on 1.10.24 at pt request

## 2022-02-25 NOTE — Telephone Encounter (Signed)
Noted  

## 2022-03-04 DIAGNOSIS — M810 Age-related osteoporosis without current pathological fracture: Secondary | ICD-10-CM | POA: Diagnosis not present

## 2022-03-04 DIAGNOSIS — E538 Deficiency of other specified B group vitamins: Secondary | ICD-10-CM | POA: Diagnosis not present

## 2022-03-04 DIAGNOSIS — R2681 Unsteadiness on feet: Secondary | ICD-10-CM | POA: Diagnosis not present

## 2022-03-04 DIAGNOSIS — F32A Depression, unspecified: Secondary | ICD-10-CM | POA: Diagnosis not present

## 2022-03-04 DIAGNOSIS — I10 Essential (primary) hypertension: Secondary | ICD-10-CM | POA: Diagnosis not present

## 2022-03-04 DIAGNOSIS — E785 Hyperlipidemia, unspecified: Secondary | ICD-10-CM | POA: Diagnosis not present

## 2022-03-04 DIAGNOSIS — R296 Repeated falls: Secondary | ICD-10-CM | POA: Diagnosis not present

## 2022-03-04 DIAGNOSIS — G629 Polyneuropathy, unspecified: Secondary | ICD-10-CM | POA: Diagnosis not present

## 2022-03-04 DIAGNOSIS — G3184 Mild cognitive impairment, so stated: Secondary | ICD-10-CM | POA: Diagnosis not present

## 2022-03-04 DIAGNOSIS — Z8673 Personal history of transient ischemic attack (TIA), and cerebral infarction without residual deficits: Secondary | ICD-10-CM | POA: Diagnosis not present

## 2022-03-04 DIAGNOSIS — Z9181 History of falling: Secondary | ICD-10-CM | POA: Diagnosis not present

## 2022-03-04 DIAGNOSIS — R251 Tremor, unspecified: Secondary | ICD-10-CM | POA: Diagnosis not present

## 2022-03-06 ENCOUNTER — Telehealth: Payer: Self-pay | Admitting: Internal Medicine

## 2022-03-06 NOTE — Telephone Encounter (Signed)
Caller/Agency: [facility] Number: [phone] Requesting OT/PT/Skilled Nursing/Social Work/Speech Therapy: PT Frequency: 2x2  1x6

## 2022-03-06 NOTE — Telephone Encounter (Signed)
LMOM for Flo w/ verbal orders.

## 2022-03-07 ENCOUNTER — Telehealth: Payer: Self-pay | Admitting: Internal Medicine

## 2022-03-09 NOTE — Telephone Encounter (Signed)
PDMP okay, Rx sent 

## 2022-03-09 NOTE — Telephone Encounter (Signed)
Requesting: alprazolam 0.5mg  Contract: 04/22/21 UDS: 02/11/22 Last Visit: 02/11/22 Next Visit: 07/14/22 Last Refill: 05/08/21 #270 and 0RF   Please Advise

## 2022-03-12 DIAGNOSIS — R2681 Unsteadiness on feet: Secondary | ICD-10-CM | POA: Diagnosis not present

## 2022-03-12 DIAGNOSIS — R251 Tremor, unspecified: Secondary | ICD-10-CM | POA: Diagnosis not present

## 2022-03-12 DIAGNOSIS — M810 Age-related osteoporosis without current pathological fracture: Secondary | ICD-10-CM | POA: Diagnosis not present

## 2022-03-12 DIAGNOSIS — Z9181 History of falling: Secondary | ICD-10-CM | POA: Diagnosis not present

## 2022-03-12 DIAGNOSIS — R296 Repeated falls: Secondary | ICD-10-CM | POA: Diagnosis not present

## 2022-03-12 DIAGNOSIS — G3184 Mild cognitive impairment, so stated: Secondary | ICD-10-CM | POA: Diagnosis not present

## 2022-03-12 DIAGNOSIS — G629 Polyneuropathy, unspecified: Secondary | ICD-10-CM | POA: Diagnosis not present

## 2022-03-12 DIAGNOSIS — F32A Depression, unspecified: Secondary | ICD-10-CM | POA: Diagnosis not present

## 2022-03-12 DIAGNOSIS — E785 Hyperlipidemia, unspecified: Secondary | ICD-10-CM | POA: Diagnosis not present

## 2022-03-12 DIAGNOSIS — I10 Essential (primary) hypertension: Secondary | ICD-10-CM | POA: Diagnosis not present

## 2022-03-12 DIAGNOSIS — E538 Deficiency of other specified B group vitamins: Secondary | ICD-10-CM | POA: Diagnosis not present

## 2022-03-12 DIAGNOSIS — Z8673 Personal history of transient ischemic attack (TIA), and cerebral infarction without residual deficits: Secondary | ICD-10-CM | POA: Diagnosis not present

## 2022-03-17 ENCOUNTER — Encounter: Payer: Self-pay | Admitting: Internal Medicine

## 2022-03-17 ENCOUNTER — Telehealth: Payer: Self-pay

## 2022-03-17 DIAGNOSIS — E785 Hyperlipidemia, unspecified: Secondary | ICD-10-CM | POA: Diagnosis not present

## 2022-03-17 DIAGNOSIS — Z9181 History of falling: Secondary | ICD-10-CM | POA: Diagnosis not present

## 2022-03-17 DIAGNOSIS — M810 Age-related osteoporosis without current pathological fracture: Secondary | ICD-10-CM | POA: Diagnosis not present

## 2022-03-17 DIAGNOSIS — R2681 Unsteadiness on feet: Secondary | ICD-10-CM | POA: Diagnosis not present

## 2022-03-17 DIAGNOSIS — G3184 Mild cognitive impairment, so stated: Secondary | ICD-10-CM | POA: Diagnosis not present

## 2022-03-17 DIAGNOSIS — G629 Polyneuropathy, unspecified: Secondary | ICD-10-CM | POA: Diagnosis not present

## 2022-03-17 DIAGNOSIS — R296 Repeated falls: Secondary | ICD-10-CM | POA: Diagnosis not present

## 2022-03-17 DIAGNOSIS — I10 Essential (primary) hypertension: Secondary | ICD-10-CM | POA: Diagnosis not present

## 2022-03-17 DIAGNOSIS — E538 Deficiency of other specified B group vitamins: Secondary | ICD-10-CM | POA: Diagnosis not present

## 2022-03-17 DIAGNOSIS — Z8673 Personal history of transient ischemic attack (TIA), and cerebral infarction without residual deficits: Secondary | ICD-10-CM | POA: Diagnosis not present

## 2022-03-17 DIAGNOSIS — R251 Tremor, unspecified: Secondary | ICD-10-CM | POA: Diagnosis not present

## 2022-03-17 DIAGNOSIS — F32A Depression, unspecified: Secondary | ICD-10-CM | POA: Diagnosis not present

## 2022-03-17 NOTE — Telephone Encounter (Signed)
error 

## 2022-03-17 NOTE — Telephone Encounter (Signed)
Received call from [facility]- wanted to check Pt's normal HR.   Pulse Readings from Last 3 Encounters:  02/11/22 76  10/28/21 78  10/22/21 (!) 59   Given above.

## 2022-03-19 DIAGNOSIS — R251 Tremor, unspecified: Secondary | ICD-10-CM | POA: Diagnosis not present

## 2022-03-19 DIAGNOSIS — G3184 Mild cognitive impairment, so stated: Secondary | ICD-10-CM | POA: Diagnosis not present

## 2022-03-19 DIAGNOSIS — Z8673 Personal history of transient ischemic attack (TIA), and cerebral infarction without residual deficits: Secondary | ICD-10-CM | POA: Diagnosis not present

## 2022-03-19 DIAGNOSIS — R296 Repeated falls: Secondary | ICD-10-CM | POA: Diagnosis not present

## 2022-03-19 DIAGNOSIS — R2681 Unsteadiness on feet: Secondary | ICD-10-CM | POA: Diagnosis not present

## 2022-03-19 DIAGNOSIS — Z9181 History of falling: Secondary | ICD-10-CM | POA: Diagnosis not present

## 2022-03-19 DIAGNOSIS — F32A Depression, unspecified: Secondary | ICD-10-CM | POA: Diagnosis not present

## 2022-03-19 DIAGNOSIS — E538 Deficiency of other specified B group vitamins: Secondary | ICD-10-CM | POA: Diagnosis not present

## 2022-03-19 DIAGNOSIS — E785 Hyperlipidemia, unspecified: Secondary | ICD-10-CM | POA: Diagnosis not present

## 2022-03-19 DIAGNOSIS — M810 Age-related osteoporosis without current pathological fracture: Secondary | ICD-10-CM | POA: Diagnosis not present

## 2022-03-19 DIAGNOSIS — I10 Essential (primary) hypertension: Secondary | ICD-10-CM | POA: Diagnosis not present

## 2022-03-19 DIAGNOSIS — G629 Polyneuropathy, unspecified: Secondary | ICD-10-CM | POA: Diagnosis not present

## 2022-03-26 ENCOUNTER — Ambulatory Visit (INDEPENDENT_AMBULATORY_CARE_PROVIDER_SITE_OTHER): Payer: Medicare Other | Admitting: Family Medicine

## 2022-03-26 ENCOUNTER — Encounter: Payer: Self-pay | Admitting: Family Medicine

## 2022-03-26 ENCOUNTER — Ambulatory Visit (HOSPITAL_BASED_OUTPATIENT_CLINIC_OR_DEPARTMENT_OTHER)
Admission: RE | Admit: 2022-03-26 | Discharge: 2022-03-26 | Disposition: A | Discharge status: Home / Self Care | Payer: Medicare Other | Source: Ambulatory Visit | Attending: Family Medicine | Admitting: Family Medicine

## 2022-03-26 ENCOUNTER — Other Ambulatory Visit (HOSPITAL_BASED_OUTPATIENT_CLINIC_OR_DEPARTMENT_OTHER): Payer: Self-pay

## 2022-03-26 VITALS — BP 125/68 | HR 52 | Temp 97.7°F | Resp 16 | Wt 135.0 lb

## 2022-03-26 DIAGNOSIS — R051 Acute cough: Secondary | ICD-10-CM | POA: Diagnosis not present

## 2022-03-26 DIAGNOSIS — R059 Cough, unspecified: Secondary | ICD-10-CM | POA: Diagnosis not present

## 2022-03-26 MED ORDER — DOXYCYCLINE HYCLATE 100 MG PO TABS
100.0000 mg | ORAL_TABLET | Freq: Two times a day (BID) | ORAL | 0 refills | Status: AC
  Filled 2022-03-26: qty 20, 10d supply, fill #0

## 2022-03-26 MED ORDER — GUAIFENESIN ER 600 MG PO TB12
1200.0000 mg | ORAL_TABLET | Freq: Two times a day (BID) | ORAL | 0 refills | Status: DC
  Filled 2022-03-26: qty 20, 5d supply, fill #0

## 2022-03-26 NOTE — Progress Notes (Signed)
Acute Office Visit  Subjective:     Patient ID: Andrea Martin, female    DOB: December 26, 1935, 87 y.o.   MRN: [id number]  Chief Complaint  Patient presents with   Cough   Nasal Congestion     Patient is in today for cough.  Patient is reporting 3-4 weeks of of URI symptoms. States it started as a cold, but then settled into her chest. She has been coughing - reports mostly dry, but occasionally she will have a good, deep cough with white/yellow sputum. She feels like she is having trouble clearing her lungs and has had some lower chest soreness/tightness, but no chest pain or wheezing. She has also felt pretty fatigued, along with some sinus pressure and nasal congestion during this time. She denies any fevers, chills, GI/GU symptoms, headaches. She has been getting minimal relief with Tylenol.     All review of systems negative except what is listed in the HPI      Objective:    BP 125/68   Pulse (!) 52   Temp 97.7 F (36.5 C)   Resp 16   Wt 135 lb (61.2 kg)   SpO2 99%   BMI 21.79 kg/m    Physical Exam Vitals reviewed.  Constitutional:      Appearance: Normal appearance.  Cardiovascular:     Rate and Rhythm: Normal rate and regular rhythm.     Pulses: Normal pulses.     Heart sounds: Normal heart sounds.  Pulmonary:     Effort: Pulmonary effort is normal.     Breath sounds: Normal breath sounds.     Comments: Slightly more diminished to RLL Skin:    General: Skin is warm and dry.  Neurological:     Mental Status: She is alert and oriented to person, place, and time.  Psychiatric:        Mood and Affect: Mood normal.        Behavior: Behavior normal.        Thought Content: Thought content normal.        Judgment: Judgment normal.     No results found for any visits on 03/26/22.      Assessment & Plan:   Problem List Items Addressed This Visit   None Visit Diagnoses     Acute cough    -  Primary Doxycycline for respiratory infection. We will let  you know if the xray shows any signs of pneumonia - if so we will let you know.  Adding some Mucinex to help break up the congestion as well.  Continue supportive measures including rest, hydration, humidifier use, steam showers, warm compresses to sinuses, warm liquids with lemon and honey, and over-the-counter cough, cold, and analgesics as needed.   Please contact office for follow-up if symptoms do not improve or worsen. Seek emergency care if symptoms become severe.     Relevant Medications   guaiFENesin (MUCINEX) 600 MG 12 hr tablet   doxycycline (VIBRA-TABS) 100 MG tablet   Other Relevant Orders   DG Chest 2 View       Meds ordered this encounter  Medications   guaiFENesin (MUCINEX) 600 MG 12 hr tablet    Sig: Take 2 tablets (1,200 mg total) by mouth 2 (two) times daily.    Dispense:  30 tablet    Refill:  0    Order Specific Question:   Supervising Provider    Answer:   Penni Homans A [[id number]]   doxycycline (VIBRA-TABS) 100  MG tablet    Sig: Take 1 tablet (100 mg total) by mouth 2 (two) times daily for 10 days.    Dispense:  20 tablet    Refill:  0    Order Specific Question:   Supervising Provider    Answer:   Penni Homans A [[id number]]    Return if symptoms worsen or fail to improve.  Terrilyn Saver, NP

## 2022-03-26 NOTE — Patient Instructions (Signed)
Doxycycline for respiratory infection. We will let you know if the xray shows any signs of pneumonia - if so we will let you know.  Adding some Mucinex to help break up the congestion as well.  Continue supportive measures including rest, hydration, humidifier use, steam showers, warm compresses to sinuses, warm liquids with lemon and honey, and over-the-counter cough, cold, and analgesics as needed.   Please contact office for follow-up if symptoms do not improve or worsen. Seek emergency care if symptoms become severe.

## 2022-03-27 ENCOUNTER — Telehealth: Payer: Self-pay

## 2022-03-27 DIAGNOSIS — E538 Deficiency of other specified B group vitamins: Secondary | ICD-10-CM

## 2022-03-27 DIAGNOSIS — R251 Tremor, unspecified: Secondary | ICD-10-CM

## 2022-03-27 DIAGNOSIS — F419 Anxiety disorder, unspecified: Secondary | ICD-10-CM

## 2022-03-27 DIAGNOSIS — Z8673 Personal history of transient ischemic attack (TIA), and cerebral infarction without residual deficits: Secondary | ICD-10-CM

## 2022-03-27 DIAGNOSIS — E785 Hyperlipidemia, unspecified: Secondary | ICD-10-CM

## 2022-03-27 DIAGNOSIS — M810 Age-related osteoporosis without current pathological fracture: Secondary | ICD-10-CM

## 2022-03-27 DIAGNOSIS — F32A Depression, unspecified: Secondary | ICD-10-CM

## 2022-03-27 DIAGNOSIS — G3184 Mild cognitive impairment, so stated: Secondary | ICD-10-CM | POA: Diagnosis not present

## 2022-03-27 DIAGNOSIS — I10 Essential (primary) hypertension: Secondary | ICD-10-CM | POA: Diagnosis not present

## 2022-03-27 DIAGNOSIS — Z9181 History of falling: Secondary | ICD-10-CM

## 2022-03-27 DIAGNOSIS — R296 Repeated falls: Secondary | ICD-10-CM

## 2022-03-27 DIAGNOSIS — G629 Polyneuropathy, unspecified: Secondary | ICD-10-CM | POA: Diagnosis not present

## 2022-03-27 DIAGNOSIS — R2681 Unsteadiness on feet: Secondary | ICD-10-CM | POA: Diagnosis not present

## 2022-03-27 NOTE — Telephone Encounter (Signed)
Plan of care signed and faxed back to [facility] at [phone]. Form sent for scanning.

## 2022-03-31 ENCOUNTER — Telehealth: Payer: Self-pay

## 2022-03-31 NOTE — Telephone Encounter (Signed)
Orders received from [facility]- signed and faxed back to [phone]. Form sent for scanning.

## 2022-04-02 DIAGNOSIS — G3184 Mild cognitive impairment, so stated: Secondary | ICD-10-CM | POA: Diagnosis not present

## 2022-04-02 DIAGNOSIS — G629 Polyneuropathy, unspecified: Secondary | ICD-10-CM | POA: Diagnosis not present

## 2022-04-02 DIAGNOSIS — Z9181 History of falling: Secondary | ICD-10-CM | POA: Diagnosis not present

## 2022-04-02 DIAGNOSIS — M810 Age-related osteoporosis without current pathological fracture: Secondary | ICD-10-CM | POA: Diagnosis not present

## 2022-04-02 DIAGNOSIS — R251 Tremor, unspecified: Secondary | ICD-10-CM | POA: Diagnosis not present

## 2022-04-02 DIAGNOSIS — E785 Hyperlipidemia, unspecified: Secondary | ICD-10-CM | POA: Diagnosis not present

## 2022-04-02 DIAGNOSIS — F32A Depression, unspecified: Secondary | ICD-10-CM | POA: Diagnosis not present

## 2022-04-02 DIAGNOSIS — E538 Deficiency of other specified B group vitamins: Secondary | ICD-10-CM | POA: Diagnosis not present

## 2022-04-02 DIAGNOSIS — Z8673 Personal history of transient ischemic attack (TIA), and cerebral infarction without residual deficits: Secondary | ICD-10-CM | POA: Diagnosis not present

## 2022-04-02 DIAGNOSIS — R2681 Unsteadiness on feet: Secondary | ICD-10-CM | POA: Diagnosis not present

## 2022-04-02 DIAGNOSIS — R296 Repeated falls: Secondary | ICD-10-CM | POA: Diagnosis not present

## 2022-04-02 DIAGNOSIS — I10 Essential (primary) hypertension: Secondary | ICD-10-CM | POA: Diagnosis not present

## 2022-04-08 DIAGNOSIS — G3184 Mild cognitive impairment, so stated: Secondary | ICD-10-CM | POA: Diagnosis not present

## 2022-04-08 DIAGNOSIS — E785 Hyperlipidemia, unspecified: Secondary | ICD-10-CM | POA: Diagnosis not present

## 2022-04-08 DIAGNOSIS — Z8673 Personal history of transient ischemic attack (TIA), and cerebral infarction without residual deficits: Secondary | ICD-10-CM | POA: Diagnosis not present

## 2022-04-08 DIAGNOSIS — E538 Deficiency of other specified B group vitamins: Secondary | ICD-10-CM | POA: Diagnosis not present

## 2022-04-08 DIAGNOSIS — I10 Essential (primary) hypertension: Secondary | ICD-10-CM | POA: Diagnosis not present

## 2022-04-08 DIAGNOSIS — M810 Age-related osteoporosis without current pathological fracture: Secondary | ICD-10-CM | POA: Diagnosis not present

## 2022-04-08 DIAGNOSIS — R296 Repeated falls: Secondary | ICD-10-CM | POA: Diagnosis not present

## 2022-04-08 DIAGNOSIS — Z9181 History of falling: Secondary | ICD-10-CM | POA: Diagnosis not present

## 2022-04-08 DIAGNOSIS — R251 Tremor, unspecified: Secondary | ICD-10-CM | POA: Diagnosis not present

## 2022-04-08 DIAGNOSIS — G629 Polyneuropathy, unspecified: Secondary | ICD-10-CM | POA: Diagnosis not present

## 2022-04-08 DIAGNOSIS — R2681 Unsteadiness on feet: Secondary | ICD-10-CM | POA: Diagnosis not present

## 2022-04-08 DIAGNOSIS — F32A Depression, unspecified: Secondary | ICD-10-CM | POA: Diagnosis not present

## 2022-04-13 ENCOUNTER — Emergency Department (HOSPITAL_BASED_OUTPATIENT_CLINIC_OR_DEPARTMENT_OTHER): Payer: Medicare Other

## 2022-04-13 ENCOUNTER — Other Ambulatory Visit: Payer: Self-pay

## 2022-04-13 ENCOUNTER — Emergency Department (HOSPITAL_BASED_OUTPATIENT_CLINIC_OR_DEPARTMENT_OTHER)
Admission: EM | Admit: 2022-04-13 | Discharge: 2022-04-13 | Disposition: A | Discharge status: Home / Self Care | Payer: Medicare Other | Attending: Emergency Medicine | Admitting: Emergency Medicine

## 2022-04-13 ENCOUNTER — Encounter (HOSPITAL_BASED_OUTPATIENT_CLINIC_OR_DEPARTMENT_OTHER): Payer: Self-pay

## 2022-04-13 DIAGNOSIS — M25532 Pain in left wrist: Secondary | ICD-10-CM | POA: Diagnosis present

## 2022-04-13 DIAGNOSIS — R519 Headache, unspecified: Secondary | ICD-10-CM | POA: Diagnosis not present

## 2022-04-13 DIAGNOSIS — W01190A Fall on same level from slipping, tripping and stumbling with subsequent striking against furniture, initial encounter: Secondary | ICD-10-CM | POA: Insufficient documentation

## 2022-04-13 DIAGNOSIS — S62002A Unspecified fracture of navicular [scaphoid] bone of left wrist, initial encounter for closed fracture: Secondary | ICD-10-CM | POA: Insufficient documentation

## 2022-04-13 DIAGNOSIS — S0083XA Contusion of other part of head, initial encounter: Secondary | ICD-10-CM | POA: Insufficient documentation

## 2022-04-13 DIAGNOSIS — S0093XA Contusion of unspecified part of head, initial encounter: Secondary | ICD-10-CM

## 2022-04-13 DIAGNOSIS — Z79899 Other long term (current) drug therapy: Secondary | ICD-10-CM | POA: Insufficient documentation

## 2022-04-13 DIAGNOSIS — S62015A Nondisplaced fracture of distal pole of navicular [scaphoid] bone of left wrist, initial encounter for closed fracture: Secondary | ICD-10-CM | POA: Diagnosis not present

## 2022-04-13 MED ORDER — HYDROCODONE-ACETAMINOPHEN 5-325 MG PO TABS
1.0000 | ORAL_TABLET | Freq: Once | ORAL | Status: AC
  Administered 2022-04-13: 1 via ORAL
  Filled 2022-04-13: qty 1

## 2022-04-13 NOTE — ED Provider Notes (Signed)
[facility] Provider Note   CSN: [id number] Arrival date & time: 04/13/22  1744     History  Chief Complaint  Patient presents with   Fall   Wrist Pain    L    Andrea Martin is a 87 y.o. female.  Patient is a 87 year old female who presents with wrist pain after a fall.  She says she tripped and hit the left side of her forehead on a table.  She also caught herself with her left arm and has pain in her left wrist.  She denies any loss of consciousness.  No neck or back pain.  She denies any other injuries.  She is not on anticoagulants.  She denies any symptoms preceding the fall, she says she just tripped and fell.       Home Medications Prior to Admission medications   Medication Sig Start Date End Date Taking? Authorizing Provider  acetaminophen (TYLENOL) 500 MG tablet Take 2 tablets (1,000 mg total) by mouth every 6 (six) hours. 09/04/20   Ezekiel Slocumb, DO  alendronate (FOSAMAX) 70 MG tablet Take 1 tablet (70 mg total) by mouth every 7 (seven) days. Take with a full glass of water on an empty stomach. 10/28/21   Colon Branch, MD  ALPRAZolam Duanne Moron) 0.5 MG tablet TAKE 1/2 TO 1 TABLET BY MOUTH 3  TIMES DAILY AS NEEDED FOR  ANXIETY 03/09/22   Colon Branch, MD  azelastine (ASTELIN) 0.1 % nasal spray Place 2 sprays into both nostrils at bedtime as needed for rhinitis. Use in each nostril as directed 12/15/17   Colon Branch, MD  buPROPion [facility] SR) 150 MG 12 hr tablet Take 2 tablets (300 mg total) by mouth daily. 11/10/21   Colon Branch, MD  Calcium Carb-Cholecalciferol (CALCIUM 600+D3) 600-800 MG-UNIT TABS Take 1 tablet by mouth every morning.    [provider]  Cholecalciferol (VITAMIN D3) 50 MCG (2000 UT) TABS Take 2,000 Units by mouth every morning.    [provider]  donepezil (ARICEPT) 5 MG tablet TAKE 1 TABLET BY MOUTH AT  BEDTIME 02/17/22   Colon Branch, MD  escitalopram (LEXAPRO) 20 MG tablet Take 1 tablet  (20 mg total) by mouth daily. 11/10/21   Colon Branch, MD  GLUCOSAMINE-CHONDROITIN PO Take 1 tablet by mouth every morning. Glucosamine 1500 mg, chondroitin 1200 mg    [provider]  guaiFENesin (MUCINEX) 600 MG 12 hr tablet Take 2 tablets (1,200 mg total) by mouth 2 (two) times daily. 03/26/22   Terrilyn Saver, NP  losartan (COZAAR) 50 MG tablet TAKE 1 TABLET BY MOUTH DAILY 02/17/22   Colon Branch, MD  meclizine (ANTIVERT) 12.5 MG tablet Take 1 tablet (12.5 mg total) by mouth 3 (three) times daily as needed for dizziness. 11/03/19   Saguier, Percell Miller, PA-C  Multiple Vitamin (MULTIVITAMIN WITH MINERALS) TABS tablet Take 1 tablet by mouth every morning.    [provider]  Omega-3 Fatty Acids (FISH OIL) 600 MG CAPS Take 600 mg by mouth every morning.    [provider]  polyethylene glycol powder (GLYCOLAX/MIRALAX) 17 GM/SCOOP powder Mix 1 capful in 8 oz of liquid and drink everyday 09/05/20   Nicole Kindred A, DO  pravastatin (PRAVACHOL) 20 MG tablet TAKE 1 TABLET BY MOUTH AT  BEDTIME 07/22/21   Paz, Alda Berthold, MD  senna-docusate (SENOKOT-S) 8.6-50 MG tablet Take 1 tablet by mouth 2 (two) times daily. 09/04/20  Nicole Kindred A, DO      Allergies    Codeine, Sulfa antibiotics, and Penicillins    Review of Systems   Review of Systems  Constitutional:  Negative for fatigue and fever.  HENT:  Negative for nosebleeds.   Eyes:  Negative for pain and visual disturbance.  Cardiovascular:  Negative for chest pain.  Gastrointestinal:  Negative for nausea and vomiting.  Musculoskeletal:  Positive for arthralgias.  Skin:  Negative for wound.  Neurological:  Negative for headaches.    Physical Exam Updated Vital Signs BP (!) 201/60   Pulse 61   Temp 98.2 F (36.8 C) (Oral)   Resp 16   SpO2 95%  Physical Exam Constitutional:      Appearance: She is well-developed.  HENT:     Head: Normocephalic.     Comments: Small hematoma to the left forehead Eyes:     Pupils: Pupils  are equal, round, and reactive to light.  Neck:     Comments: No pain to the cervical, thoracic or lumbosacral spine, no step-offs or deformities Cardiovascular:     Rate and Rhythm: Normal rate and regular rhythm.     Heart sounds: Normal heart sounds.  Pulmonary:     Effort: Pulmonary effort is normal. No respiratory distress.     Breath sounds: Normal breath sounds. No wheezing or rales.  Chest:     Chest wall: No tenderness.  Abdominal:     General: Bowel sounds are normal.     Palpations: Abdomen is soft.     Tenderness: There is no abdominal tenderness. There is no guarding or rebound.  Musculoskeletal:        General: Normal range of motion.     Comments: Positive pain and swelling, ecchymosis to the left wrist over the scaphoid bone.    Lymphadenopathy:     Cervical: No cervical adenopathy.  Skin:    General: Skin is warm and dry.     Findings: No rash.  Neurological:     Mental Status: She is alert and oriented to person, place, and time.     ED Results / Procedures / Treatments   Labs (all labs ordered are listed, but only abnormal results are displayed) Labs Reviewed - No data to display  EKG None  Radiology DG Wrist Complete Left  Result Date: 04/13/2022 CLINICAL DATA:  Tripped and fell today.  Pain EXAM: LEFT WRIST - COMPLETE 3+ VIEW COMPARISON:  None Available. FINDINGS: Acute fracture of navicular bone, nondisplaced. Chronic degenerative changes of the first carpometacarpal joint with multiple loose bodies. Chronic degenerative chondrocalcinosis of aging. IMPRESSION: 1. Acute nondisplaced fracture of the navicular bone. 2. Chronic degenerative changes of the first carpometacarpal joint with multiple loose bodies. Electronically Signed   By: Nelson Chimes M.D.   On: 04/13/2022 19:00   CT Head Wo Contrast  Result Date: 04/13/2022 CLINICAL DATA:  Recent fall with headaches, initial encounter EXAM: CT HEAD WITHOUT CONTRAST TECHNIQUE: Contiguous axial images were  obtained from the base of the skull through the vertex without intravenous contrast. RADIATION DOSE REDUCTION: This exam was performed according to the departmental dose-optimization program which includes automated exposure control, adjustment of the mA and/or kV according to patient size and/or use of iterative reconstruction technique. COMPARISON:  09/07/2009 FINDINGS: Brain: No evidence of acute infarction, hemorrhage, hydrocephalus, extra-axial collection or mass lesion/mass effect. Bilateral basal ganglia calcifications are noted. Chronic atrophic and ischemic changes are noted. Vascular: No hyperdense vessel or unexpected calcification. Skull: Normal. Negative  for fracture or focal lesion. Sinuses/Orbits: No acute finding. Other: None. IMPRESSION: Chronic atrophic and ischemic changes without acute abnormality. Electronically Signed   By: Inez Catalina M.D.   On: 04/13/2022 18:48    Procedures Procedures    Medications Ordered in ED Medications  HYDROcodone-acetaminophen (NORCO/VICODIN) 5-325 MG per tablet 1 tablet (1 tablet Oral Given 04/13/22 2209)    ED Course/ Medical Decision Making/ A&P                             Medical Decision Making Amount and/or Complexity of Data Reviewed Radiology: ordered.  Risk Prescription drug management.   Patient is a 87 year old female who presents with pain in her left wrist after a mechanical fall.  She also has a small hematoma to her left forehead.  CT scan of the head shows no acute abnormalities.  No intracranial hemorrhage.  She does not have any spinal tenderness or complaints of pain.  X-rays of the left wrist show a scaphoid fracture.  This was interpreted by me and confirmed by the radiologist.  She was placed in a thumb spica splint.  She was given dose of Vicodin for pain.  However in discussion about pain management techniques at home, she is opting to continue with Tylenol.  Will refer her to follow-up with hand surgery.  I did advise  her of the importance of hand surgery follow-up to assure that it is healing well.  Her blood pressure on recheck was 191/87.  She has not taken her nighttime blood pressure medicine.  She is asymptomatic regarding this.  She can have this rechecked by her primary care doctor.  Final Clinical Impression(s) / ED Diagnoses Final diagnoses:  Closed nondisplaced fracture of scaphoid of left wrist, unspecified portion of scaphoid, initial encounter  Contusion of head, unspecified part of head, initial encounter    Rx / DC Orders ED Discharge Orders     None         Malvin Johns, MD 04/13/22 2246

## 2022-04-13 NOTE — ED Triage Notes (Signed)
Pt states that she fell this afternoon- "tripped over something." Caught herself on heel of L hand, concern for break around L wrist. CNS intact distal to injury. -LOC, states she hit her head on the table, - thinners; "I had a goose egg but it's gone way down."

## 2022-04-13 NOTE — Discharge Instructions (Signed)
Call tomorrow to make an appointment with the hand surgeon listed above.  Return to the emergency room if you have any worsening symptoms.

## 2022-04-15 DIAGNOSIS — S62025A Nondisplaced fracture of middle third of navicular [scaphoid] bone of left wrist, initial encounter for closed fracture: Secondary | ICD-10-CM | POA: Diagnosis not present

## 2022-04-16 DIAGNOSIS — S62025A Nondisplaced fracture of middle third of navicular [scaphoid] bone of left wrist, initial encounter for closed fracture: Secondary | ICD-10-CM | POA: Diagnosis not present

## 2022-04-16 DIAGNOSIS — M25632 Stiffness of left wrist, not elsewhere classified: Secondary | ICD-10-CM | POA: Diagnosis not present

## 2022-04-16 DIAGNOSIS — M25532 Pain in left wrist: Secondary | ICD-10-CM | POA: Diagnosis not present

## 2022-04-20 ENCOUNTER — Telehealth: Payer: Self-pay

## 2022-04-20 NOTE — Telephone Encounter (Signed)
Physician orders signed and faxed back to [facility] at [phone]. Form sent for scanning.

## 2022-04-20 NOTE — Telephone Encounter (Signed)
        Patient  visited [facility] on 2/19     Telephone encounter attempt :1st    A HIPAA compliant voice message was left requesting a return call.  Instructed patient to call back    [facility] [phone] [address], [place], [place] [address] Phone: [phone] Email: [email or web address]

## 2022-04-21 ENCOUNTER — Telehealth: Payer: Self-pay

## 2022-04-21 NOTE — Telephone Encounter (Signed)
        Patient  visited [facility] on 2/19    Telephone encounter attempt :  2nd  A HIPAA compliant voice message was left requesting a return call.  Instructed patient to call back .    [facility] [phone] [address], [place], [place] [address] Phone: [phone] Email: [email or web address]

## 2022-04-23 DIAGNOSIS — E785 Hyperlipidemia, unspecified: Secondary | ICD-10-CM | POA: Diagnosis not present

## 2022-04-23 DIAGNOSIS — F32A Depression, unspecified: Secondary | ICD-10-CM | POA: Diagnosis not present

## 2022-04-23 DIAGNOSIS — G3184 Mild cognitive impairment, so stated: Secondary | ICD-10-CM | POA: Diagnosis not present

## 2022-04-23 DIAGNOSIS — G629 Polyneuropathy, unspecified: Secondary | ICD-10-CM | POA: Diagnosis not present

## 2022-04-23 DIAGNOSIS — R2681 Unsteadiness on feet: Secondary | ICD-10-CM | POA: Diagnosis not present

## 2022-04-23 DIAGNOSIS — R251 Tremor, unspecified: Secondary | ICD-10-CM | POA: Diagnosis not present

## 2022-04-23 DIAGNOSIS — Z8673 Personal history of transient ischemic attack (TIA), and cerebral infarction without residual deficits: Secondary | ICD-10-CM | POA: Diagnosis not present

## 2022-04-23 DIAGNOSIS — R296 Repeated falls: Secondary | ICD-10-CM | POA: Diagnosis not present

## 2022-04-23 DIAGNOSIS — Z9181 History of falling: Secondary | ICD-10-CM | POA: Diagnosis not present

## 2022-04-23 DIAGNOSIS — M810 Age-related osteoporosis without current pathological fracture: Secondary | ICD-10-CM | POA: Diagnosis not present

## 2022-04-23 DIAGNOSIS — I10 Essential (primary) hypertension: Secondary | ICD-10-CM | POA: Diagnosis not present

## 2022-04-23 DIAGNOSIS — E538 Deficiency of other specified B group vitamins: Secondary | ICD-10-CM | POA: Diagnosis not present

## 2022-05-06 DIAGNOSIS — S62025A Nondisplaced fracture of middle third of navicular [scaphoid] bone of left wrist, initial encounter for closed fracture: Secondary | ICD-10-CM | POA: Diagnosis not present

## 2022-05-11 ENCOUNTER — Other Ambulatory Visit: Payer: Self-pay | Admitting: Internal Medicine

## 2022-05-25 DIAGNOSIS — S62025A Nondisplaced fracture of middle third of navicular [scaphoid] bone of left wrist, initial encounter for closed fracture: Secondary | ICD-10-CM | POA: Diagnosis not present

## 2022-07-06 ENCOUNTER — Other Ambulatory Visit: Payer: Self-pay | Admitting: Internal Medicine

## 2022-07-08 DIAGNOSIS — S62025A Nondisplaced fracture of middle third of navicular [scaphoid] bone of left wrist, initial encounter for closed fracture: Secondary | ICD-10-CM | POA: Diagnosis not present

## 2022-07-14 ENCOUNTER — Ambulatory Visit (INDEPENDENT_AMBULATORY_CARE_PROVIDER_SITE_OTHER): Payer: Medicare Other | Admitting: Internal Medicine

## 2022-07-14 ENCOUNTER — Encounter: Payer: Self-pay | Admitting: Internal Medicine

## 2022-07-14 VITALS — BP 134/84 | HR 59 | Temp 97.9°F | Resp 18 | Ht 66.0 in | Wt 135.5 lb

## 2022-07-14 DIAGNOSIS — G3184 Mild cognitive impairment, so stated: Secondary | ICD-10-CM | POA: Diagnosis not present

## 2022-07-14 DIAGNOSIS — I10 Essential (primary) hypertension: Secondary | ICD-10-CM | POA: Diagnosis not present

## 2022-07-14 DIAGNOSIS — E785 Hyperlipidemia, unspecified: Secondary | ICD-10-CM | POA: Diagnosis not present

## 2022-07-14 DIAGNOSIS — Z0000 Encounter for general adult medical examination without abnormal findings: Secondary | ICD-10-CM | POA: Diagnosis not present

## 2022-07-14 MED ORDER — PRAVASTATIN SODIUM 40 MG PO TABS: 40.0000 mg | ORAL_TABLET | Freq: Every day | ORAL | 1 refills | Status: DC

## 2022-07-14 NOTE — Progress Notes (Signed)
Subjective:    Patient ID: Andrea Martin, female    DOB: March 13, 1935, 87 y.o.   MRN: [id number]  DOS:  07/14/2022 Type of visit - description: CPX  Here for CPX Chronic medical problems were evaluated. Had a fall, went to the ER 03-2022, had left wrist fracture now under the care of Ortho. Pain is not an issue, fracture is not completely healed.  Denies chest pain or difficulty breathing No palpitations.       Review of Systems See above   Past Medical History:  Diagnosis Date   Anxiety and depression    Gait disorder    History of fractures due to falls 09/01/2020   HTN (hypertension) 09/01/2020   Hyperlipidemia    Neuropathy 08/04/2013   Shoulder dislocation    h/o after a fall    Past Surgical History:  Procedure Laterality Date   NO PAST SURGERIES      Current Outpatient Medications  Medication Instructions   acetaminophen (TYLENOL) 1,000 mg, Oral, Every 6 hours   alendronate (FOSAMAX) 70 mg, Oral, Every 7 days, Take with a full glass of water on an empty stomach.   ALPRAZolam (XANAX) 0.5 MG tablet TAKE 1/2 TO 1 TABLET BY MOUTH 3  TIMES DAILY AS NEEDED FOR  ANXIETY   azelastine (ASTELIN) 0.1 % nasal spray 2 sprays, Each Nare, At bedtime PRN, Use in each nostril as directed   buPROPion (WELLBUTRIN SR) 300 mg, Oral, Daily   Calcium Carb-Cholecalciferol (CALCIUM 600+D3) 600-800 MG-UNIT TABS 1 tablet, Oral, Every morning   donepezil (ARICEPT) 5 mg, Oral, Daily at bedtime   escitalopram (LEXAPRO) 20 mg, Oral, Daily   Fish Oil 600 mg, Oral, Every morning   FT Mucus Relief 12HR 1,200 mg, Oral, 2 times daily   GLUCOSAMINE-CHONDROITIN PO 1 tablet, Oral, Every morning, Glucosamine 1500 mg, chondroitin 1200 mg   losartan (COZAAR) 50 mg, Oral, Daily   meclizine (ANTIVERT) 12.5 mg, Oral, 3 times daily PRN   Multiple Vitamin (MULTIVITAMIN WITH MINERALS) TABS tablet 1 tablet, Oral, Every morning   polyethylene glycol powder (GLYCOLAX/MIRALAX) 17 GM/SCOOP powder Mix 1 capful in  8 oz of liquid and drink everyday   pravastatin (PRAVACHOL) 20 mg, Oral, Daily at bedtime   senna-docusate (SENOKOT-S) 8.6-50 MG tablet 1 tablet, Oral, 2 times daily   Vitamin D3 2,000 Units, Oral, Every morning       Objective:   Physical Exam BP 134/84   Pulse (!) 59   Temp 97.9 F (36.6 C) (Oral)   Resp 18   Ht 5\' 6"  (1.676 m)   Wt 135 lb 8 oz (61.5 kg)   SpO2 96%   BMI 21.87 kg/m  General: Well developed, NAD, BMI noted  HEENT:  Normocephalic . Face symmetric, atraumatic Lungs:  CTA B Normal respiratory effort, no intercostal retractions, no accessory muscle use. Heart: RRR,  no murmur.  Abdomen:  Not distended, soft, non-tender. No rebound or rigidity.   Lower extremities: no pretibial edema bilaterally  Skin: Exposed areas without rash. Not pale. Not jaundice Neurologic:  alert & oriented X3.  Speech normal, gait appropriate for age and unassisted Strength symmetric and appropriate for age.  Psych: Cognition and judgment appear intact.  Cooperative with normal attention span and concentration.  Behavior appropriate. No anxious or depressed appearing.     Assessment    Assessment   HTN: DX 10/2019. Hyperlipidemia.  Simvastatin intolerant.  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 High cholesterol: Baseline LDL 213, on a low-dose of Pravachol, offered to increase Pravachol dose and she agreed, increased to 40 mg daily, Rx sent.. Tremors: Still present.  Exercise no intervention at this point. Anxiety depression: Currently well-controlled on Wellbutrin, Lexapro, Xanax half tablet 3  times daily. MCI: Patient is here by herself, on Aricept, she reports she does not feel he is losing any ground. Osteoporosis: Good compliance with Fosamax, on vitamin D supplements. Gait disorder: Had another mechanical fall 03-2022, offered PT, very resistant to proceed.  Information about fall prevention provided.  Uses a walker only at home. Social: Shares a house with her daughter does not drive. RTC 5 to 6 months   -Td 2023;- Pneumonia shot 2011; prevnar 2015 - Zostavax 2008; shingrix 10/2018 -Vaccines are: RSV, PNM 20, COVID booster, flu shot every fall. -Colon and breast cancer screening: Aged out. - Labs BMP CBC -Diet-exercise: Discussed, room for improvement -Fall prevention: See comments under gait disorder. - Healthcare POA: charted   HTN: On losartan, BP today is very good, check CMP. High cholesterol: On Pravachol, checking LFTs and a FLP.  Further advised with results Tremors: Slightly worse, h/o s/e w/ primidone.  Occasionally has difficulty writing.  Observation for now. Anxiety depression: On Wellbutrin and Lexapro, she seems in very good spirits today. MCI: See last visit, went back on Aricept, no apparent side effects. B12 deficiency: Checking labs Osteoporosis: Good compliance with Fosamax without apparent side effects Gait disorders: Still having falls, fortunately no major consequences, referred to home PT, patient is homebound RTC 4 to 6 months

## 2022-07-14 NOTE — Patient Instructions (Addendum)
Vaccines I recommend: RSV vaccine Pneumonia shot (PNM 20) COVID booster if not done in the last few months Flu shot every fall    Check the  blood pressure regularly BP GOAL is between 110/65 and  135/85. If it is consistently higher or lower, let me know   GO TO THE LAB : Get the blood work     GO TO THE FRONT DESK, PLEASE SCHEDULE YOUR APPOINTMENTS Come back for   a checkup in 5 to 6 months      Fall Prevention in the Home, Adult Falls can cause injuries and affect people of all ages. There are many simple things that you can do to make your home safe and to help prevent falls. If you need it, ask for help making these changes. What actions can I take to prevent falls? General information Use good lighting in all rooms. Make sure to: Replace any light bulbs that burn out. Turn on lights if it is dark and use night-lights. Keep items that you use often in easy-to-reach places. Lower the shelves around your home if needed. Move furniture so that there are clear paths around it. Do not keep throw rugs or other things on the floor that can make you trip. If any of your floors are uneven, fix them. Add color or contrast paint or tape to clearly mark and help you see: Grab bars or handrails. First and last steps of staircases. Where the edge of each step is. If you use a ladder or stepladder: Make sure that it is fully opened. Do not climb a closed ladder. Make sure the sides of the ladder are locked in place. Have someone hold the ladder while you use it. Know where your pets are as you move through your home. What can I do in the bathroom?     Keep the floor dry. Clean up any water that is on the floor right away. Remove soap buildup in the bathtub or shower. Buildup makes bathtubs and showers slippery. Use non-skid mats or decals on the floor of the bathtub or shower. Attach bath mats securely with double-sided, non-slip rug tape. If you need to sit down while you  are in the shower, use a non-slip stool. Install grab bars by the toilet and in the bathtub and shower. Do not use towel bars as grab bars. What can I do in the bedroom? Make sure that you have a light by your bed that is easy to reach. Do not use any sheets or blankets on your bed that hang to the floor. Have a firm bench or chair with side arms that you can use for support when you get dressed. What can I do in the kitchen? Clean up any spills right away. If you need to reach something above you, use a sturdy step stool that has a grab bar. Keep electrical cables out of the way. Do not use floor polish or wax that makes floors slippery. What can I do with my stairs? Do not leave anything on the stairs. Make sure that you have a light switch at the top and the bottom of the stairs. Have them installed if you do not have them. Make sure that there are handrails on both sides of the stairs. Fix handrails that are broken or loose. Make sure that handrails are as long as the staircases. Install non-slip stair treads on all stairs in your home if they do not have carpet. Avoid having throw rugs at  the top or bottom of stairs, or secure the rugs with carpet tape to prevent them from moving. Choose a carpet design that does not hide the edge of steps on the stairs. Make sure that carpet is firmly attached to the stairs. Fix any carpet that is loose or worn. What can I do on the outside of my home? Use bright outdoor lighting. Repair the edges of walkways and driveways and fix any cracks. Clear paths of anything that can make you trip, such as tools or rocks. Add color or contrast paint or tape to clearly mark and help you see high doorway thresholds. Trim any bushes or trees on the main path into your home. Check that handrails are securely fastened and in good repair. Both sides of all steps should have handrails. Install guardrails along the edges of any raised decks or porches. Have leaves,  snow, and ice cleared regularly. Use sand, salt, or ice melt on walkways during winter months if you live where there is ice and snow. In the garage, clean up any spills right away, including grease or oil spills. What other actions can I take? Review your medicines with your health care provider. Some medicines can make you confused or feel dizzy. This can increase your chance of falling. Wear closed-toe shoes that fit well and support your feet. Wear shoes that have rubber soles and low heels. Use a cane, walker, scooter, or crutches that help you move around if needed. Talk with your provider about other ways that you can decrease your risk of falls. This may include seeing a physical therapist to learn to do exercises to improve movement and strength. Where to find more information Centers for Disease Control and Prevention, STEAD[employer]: TonerPromos.no [employer] on Aging: BaseRingTones.pl National Institute on Aging: BaseRingTones.pl Contact a health care provider if: You are afraid of falling at home. You feel weak, drowsy, or dizzy at home. You fall at home. Get help right away if you: Lose consciousness or have trouble moving after a fall. Have a fall that causes a head injury. These symptoms may be an emergency. Get help right away. Call 911. Do not wait to see if the symptoms will go away. Do not drive yourself to the hospital. This information is not intended to replace advice given to you by your health care provider. Make sure you discuss any questions you have with your health care provider. Document Revised: 10/13/2021 Document Reviewed: 10/13/2021 Elsevier Patient Education  2023 [employer].

## 2022-07-15 ENCOUNTER — Encounter: Payer: Self-pay | Admitting: Internal Medicine

## 2022-07-15 LAB — BASIC METABOLIC PANEL
BUN: 17 mg/dL (ref 6–23)
CO2: 29 mEq/L (ref 19–32)
Calcium: 9.4 mg/dL (ref 8.4–10.5)
Chloride: 107 mEq/L (ref 96–112)
Creatinine, Ser: 1.06 mg/dL (ref 0.40–1.20)
GFR: 47.95 mL/min — ABNORMAL LOW (ref >60.00)
Glucose, Bld: 84 mg/dL (ref 70–99)
Potassium: 4.1 mEq/L (ref 3.5–5.1)
Sodium: 141 mEq/L (ref 135–145)

## 2022-07-15 LAB — CBC WITH DIFFERENTIAL/PLATELET
Basophils Absolute: 0 10*3/uL (ref 0.0–0.1)
Basophils Relative: 0.5 % (ref 0.0–3.0)
Eosinophils Absolute: 0.1 10*3/uL (ref 0.0–0.7)
Eosinophils Relative: 1.5 % (ref 0.0–5.0)
HCT: 37.4 % (ref 36.0–46.0)
Hemoglobin: 12.4 g/dL (ref 12.0–15.0)
Lymphocytes Relative: 37.6 % (ref 12.0–46.0)
Lymphs Abs: 1.8 10*3/uL (ref 0.7–4.0)
MCHC: 33 g/dL (ref 30.0–36.0)
MCV: 95.9 fl (ref 78.0–100.0)
Monocytes Absolute: 0.4 10*3/uL (ref 0.1–1.0)
Monocytes Relative: 9.1 % (ref 3.0–12.0)
Neutro Abs: 2.5 10*3/uL (ref 1.4–7.7)
Neutrophils Relative %: 51.3 % (ref 43.0–77.0)
Platelets: 247 10*3/uL (ref 150.0–400.0)
RBC: 3.9 Mil/uL (ref 3.87–5.11)
RDW: 12.8 % (ref 11.5–15.5)
WBC: 4.9 10*3/uL (ref 4.0–10.5)

## 2022-07-15 NOTE — Assessment & Plan Note (Signed)
Here for CPX, see separate document ZOX:WRUEA controlled, continue losartan. High cholesterol: Baseline LDL 213, LDL 109 on a low-dose of Pravachol, offered to increase Pravachol dose and she agreed, increased to 40 mg daily, Rx sent.. Tremors: Still present.  Declines  treatment or eval at this point. Anxiety depression: Currently well-controlled on Wellbutrin, Lexapro, Xanax half tablet 3 times daily. MCI: Patient is here by herself, on Aricept, she reports she does not feel is losing any ground. Osteoporosis: Good compliance with Fosamax, on vitamin D supplements. Gait disorder: Had another mechanical fall 03-2022, offered PT, very resistant to proceed.  Information about fall prevention provided.  Uses a walker only at home. Social: Shares a house with her daughter does not drive. RTC 5 to 6 months

## 2022-07-15 NOTE — Assessment & Plan Note (Signed)
-  Td 2023;- Pneumonia shot 2011; prevnar 2015 - Zostavax 2008; shingrix 10/2018 -Vaccines I rec: RSV, PNM 20, COVID booster, flu shot every fall. -Colon and breast cancer screening: Aged out. - Labs BMP CBC -Diet- counseled -Fall prevention: See comments under gait disorder. - Healthcare POA: on file

## 2022-07-21 DIAGNOSIS — M1812 Unilateral primary osteoarthritis of first carpometacarpal joint, left hand: Secondary | ICD-10-CM | POA: Diagnosis not present

## 2022-07-21 DIAGNOSIS — S62025A Nondisplaced fracture of middle third of navicular [scaphoid] bone of left wrist, initial encounter for closed fracture: Secondary | ICD-10-CM | POA: Diagnosis not present

## 2022-08-05 DIAGNOSIS — S62025A Nondisplaced fracture of middle third of navicular [scaphoid] bone of left wrist, initial encounter for closed fracture: Secondary | ICD-10-CM | POA: Diagnosis not present

## 2022-08-06 IMAGING — CR DG RIBS W/ CHEST 3+V*R*
3 series · 3 of 3 positions shown · non-contrast
Comparison: None.

CLINICAL DATA: Right-sided rib pain following fall, initial
encounter

EXAM:
RIGHT RIBS AND CHEST - 3+ VIEW

[x chest ap (1 of 3)]
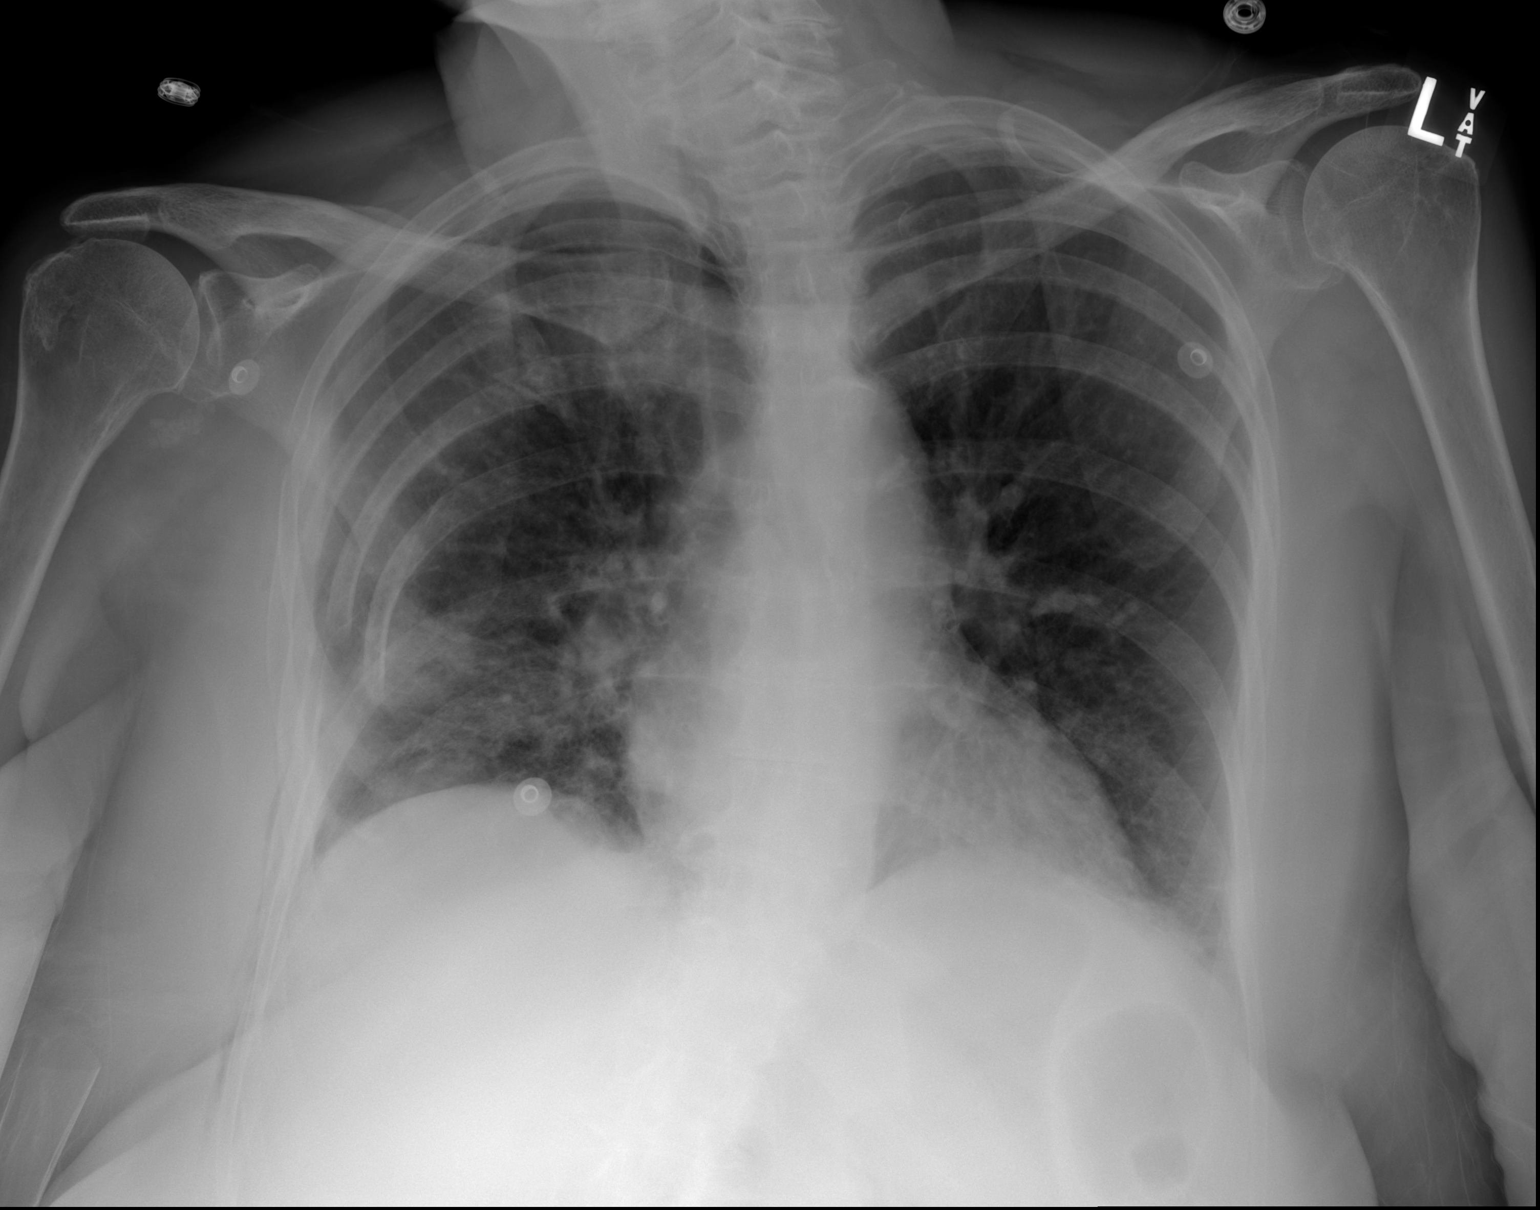

[x chest ap (2 of 3)]
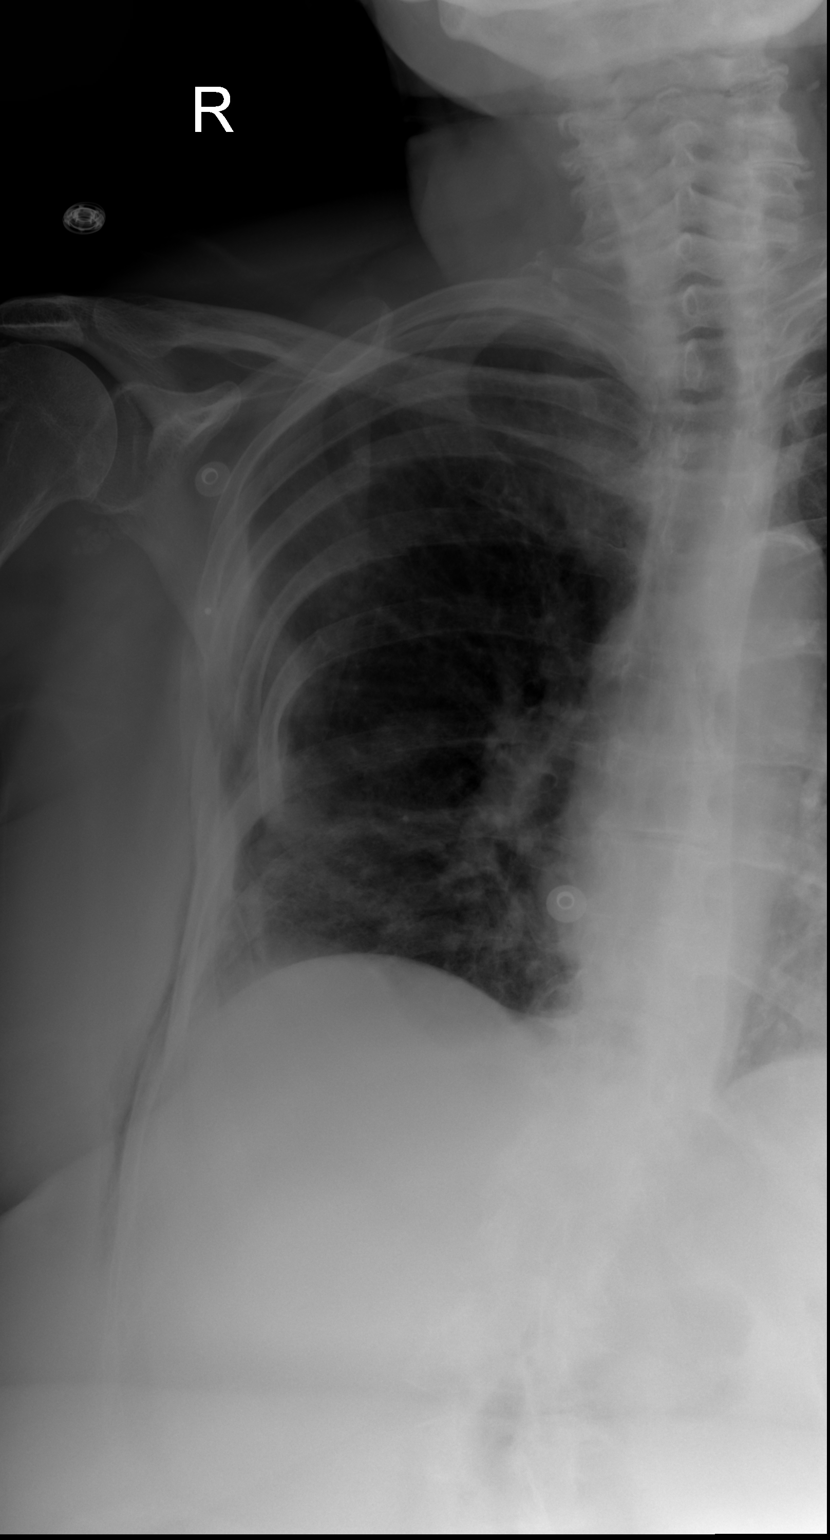

[x chest ap (3 of 3)]
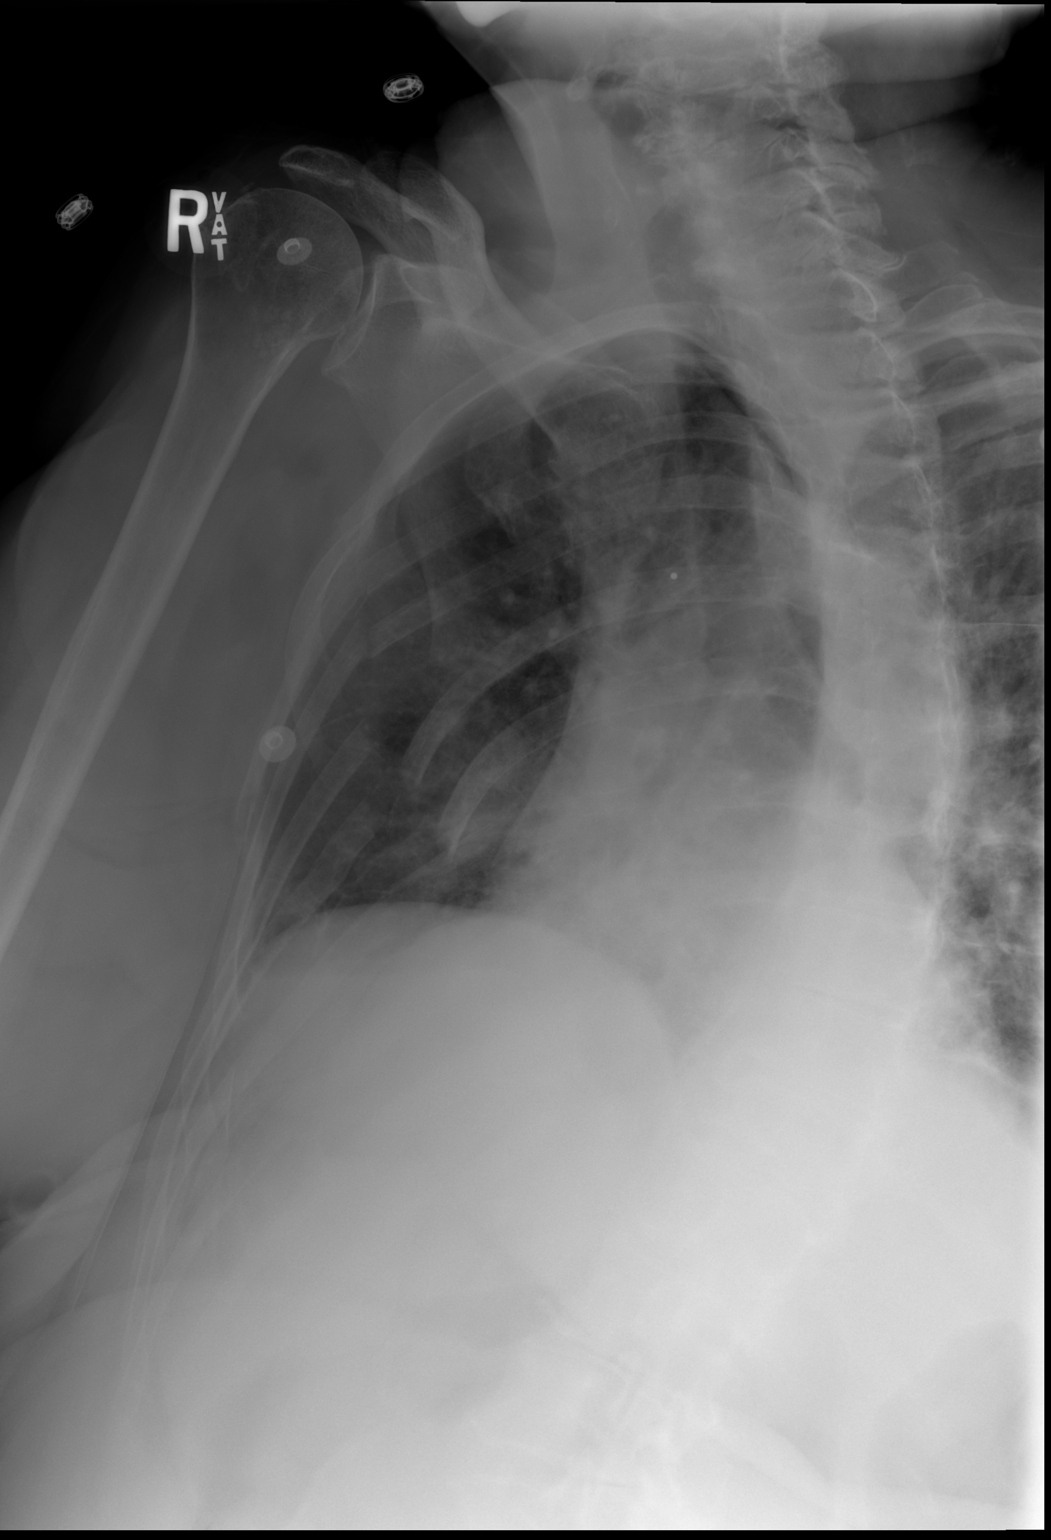

[3 of 3 positions shown; findings below may reference images not displayed]

FINDINGS: Multiple right-sided rib fractures are noted to include the third
through seventh ribs. The fractures of the sixth and seventh ribs
are significantly displaced. Multifocal fractures in the fifth and
sixth ribs are noted posteriorly. No underlying pneumothorax is
seen. Some subcutaneous emphysema is noted. Increased basilar
opacity is noted on the right likely related to contusion. No other
focal abnormality is noted.
IMPRESSION: Multiple right-sided rib fractures with underlying contusion. No
definitive pneumothorax is seen although some subcutaneous emphysema
is noted.

## 2022-08-07 IMAGING — CR DG CHEST 2V
2 series · 2 of 2 positions shown · non-contrast
Comparison: Chest x-ray and chest CT dated 08/31/2020

CLINICAL DATA: Multiple RIGHT-sided rib fractures.  Pneumothorax.

EXAM:
CHEST - 2 VIEW

[w chest lat]
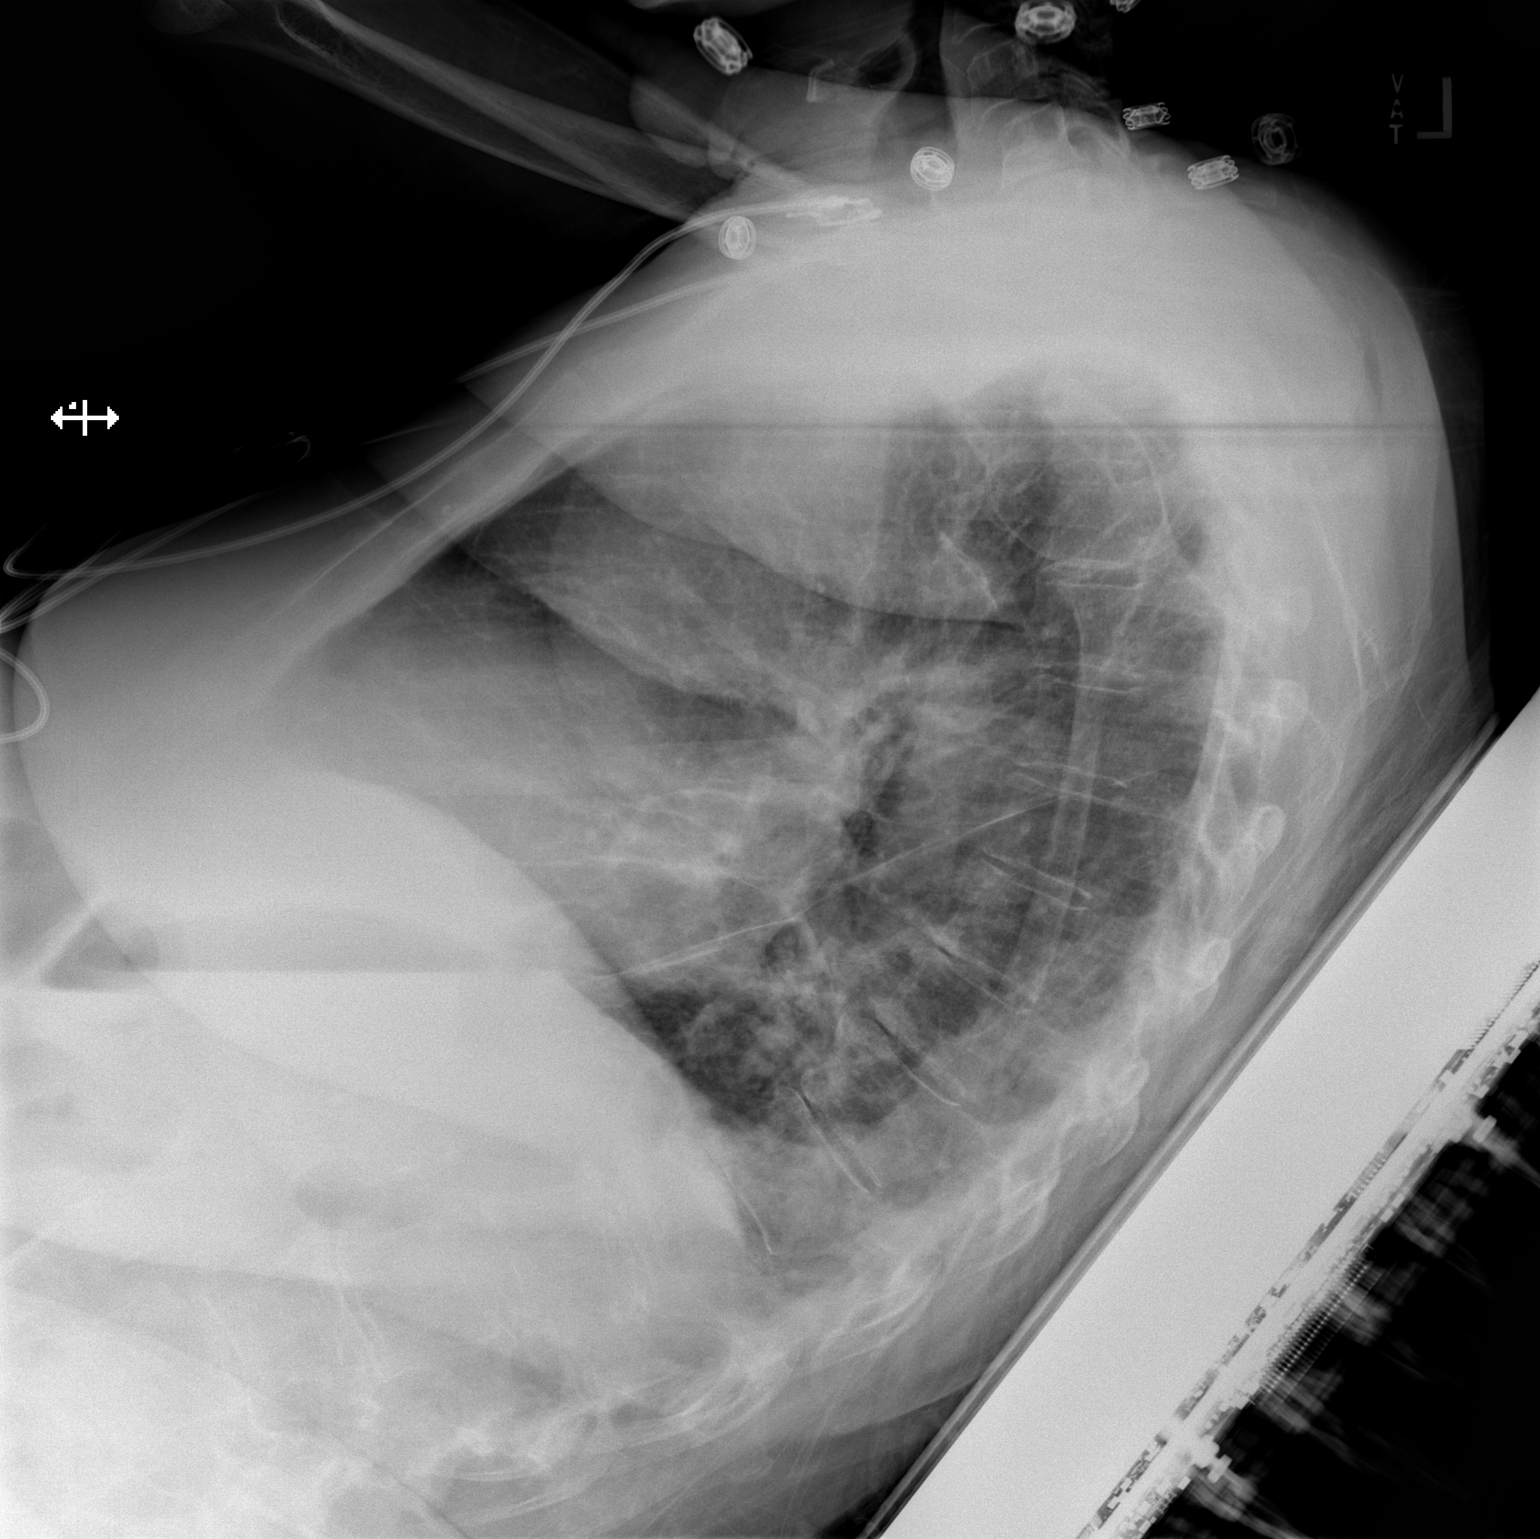

[x chest ap]
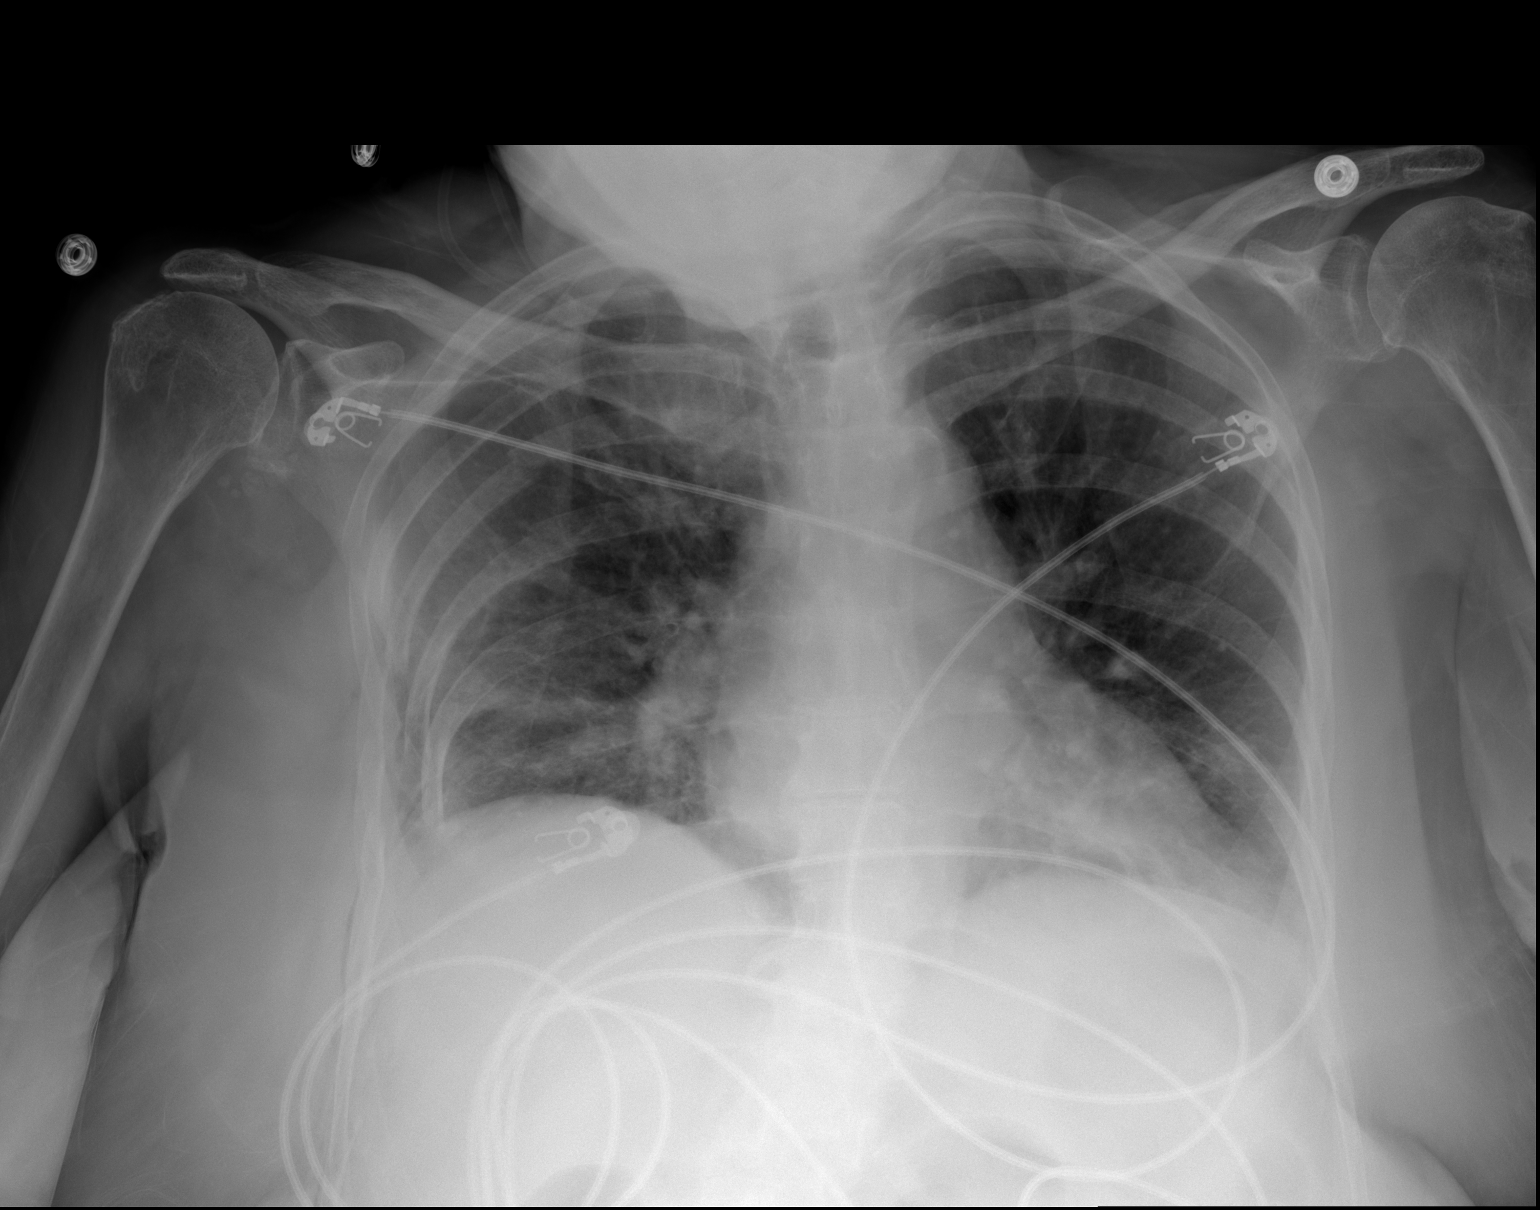

[2 of 2 positions shown; findings below may reference images not displayed]

FINDINGS: Heart size and mediastinal contours are stable. Stable mild
atelectasis and/or small pleural effusion at the RIGHT lung base.
Additional probable atelectasis at the LEFT lung base. No
pneumothorax is seen.

The multiple displaced RIGHT-sided rib fractures appear grossly
stable in alignment. No new osseous abnormality seen.
IMPRESSION: No significant change. Multiple displaced RIGHT-sided rib fractures.
Stable mild atelectasis and/or small pleural effusion at the RIGHT
lung base. No pneumothorax seen.

## 2022-08-16 ENCOUNTER — Encounter: Payer: Self-pay | Admitting: Internal Medicine

## 2022-08-17 NOTE — Telephone Encounter (Signed)
Long history of tremors. Had some side effects with primidone in 2018 Last heart rate was 59, avoid beta-blockers. Treatment options are somewhat limited, I offered her a retry w/  primidone. If symptoms severe needs to be seen and/or referred to neurology.

## 2022-08-20 IMAGING — US US ABDOMEN LIMITED
1 series · 14 of 25 positions shown · non-contrast
Comparison: Current abdomen and pelvis CT.

CLINICAL DATA: Follow-up from current CT scan, which showed mild
gallbladder wall thickening.

EXAM:
ULTRASOUND ABDOMEN LIMITED RIGHT UPPER QUADRANT

[Series 1: us abdomen limited ruq (liver/gb) · 14 of 83 slices shown]
[im 1/83]
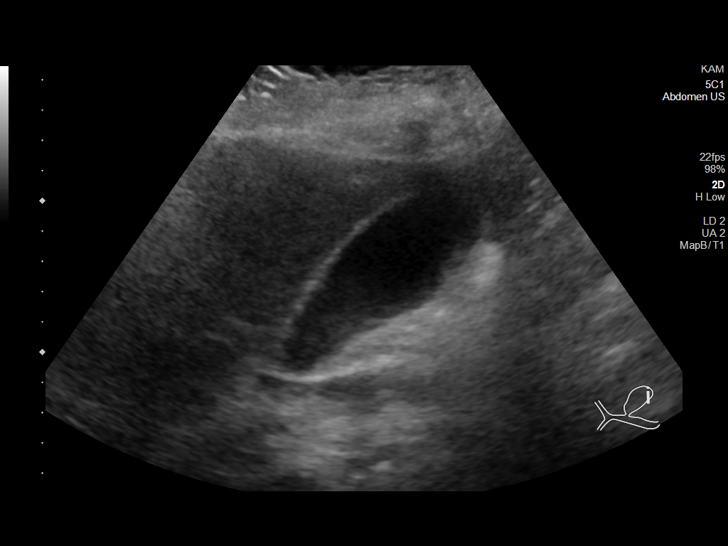
[im 7/83]
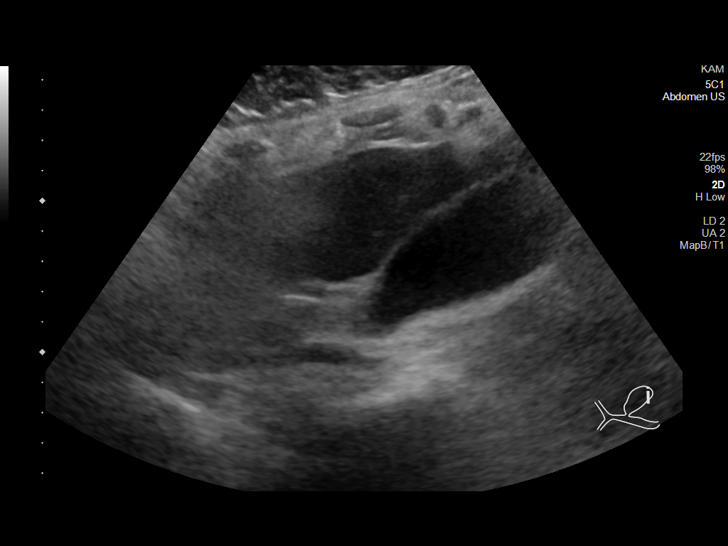
[im 14/83]
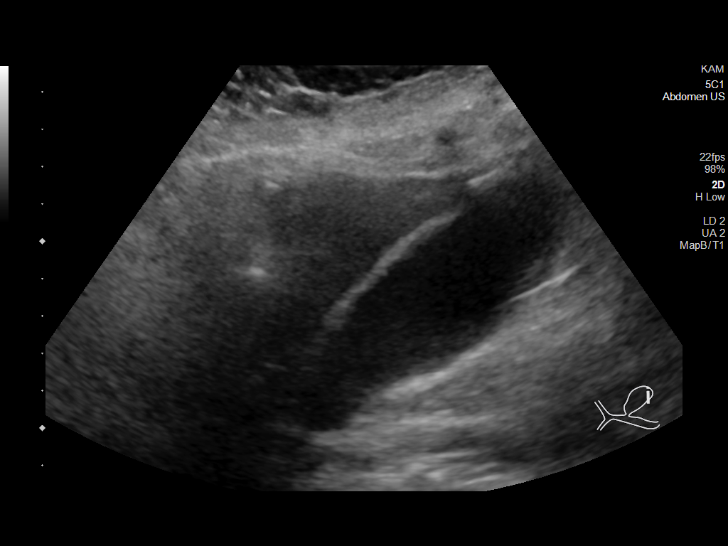
[im 21/83]
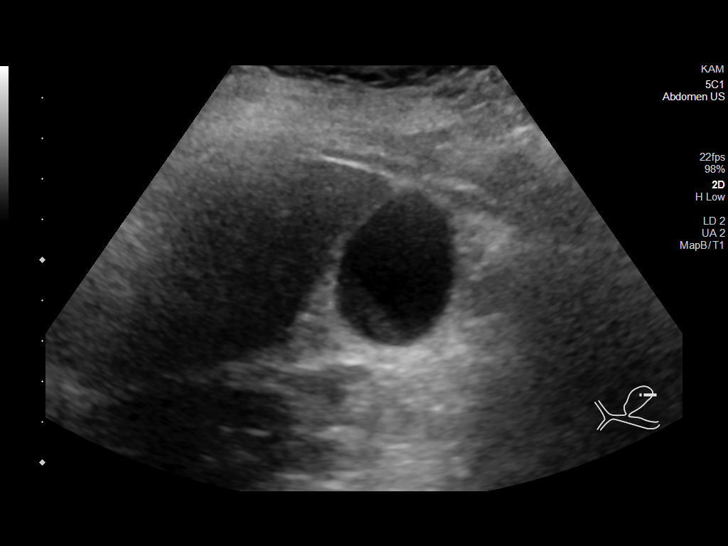
[im 28/83]
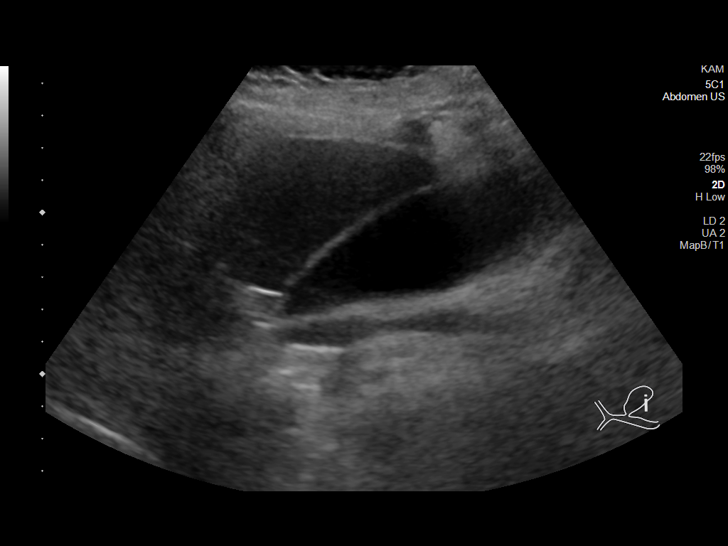
[im 31/83]
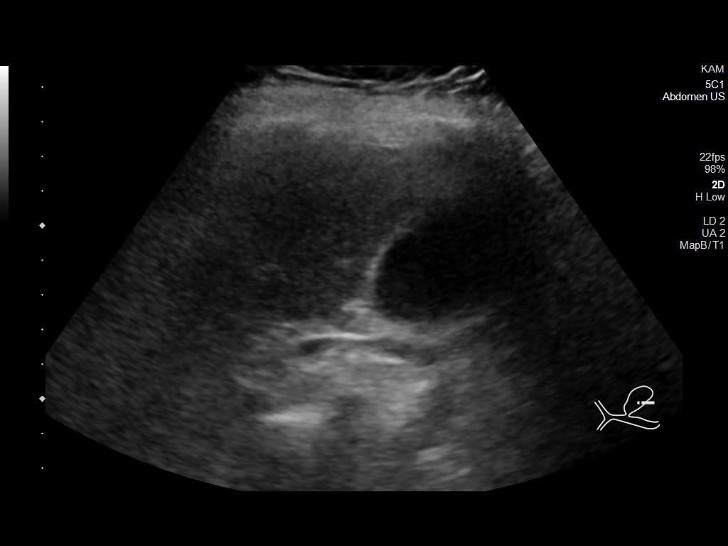
[im 38/83]
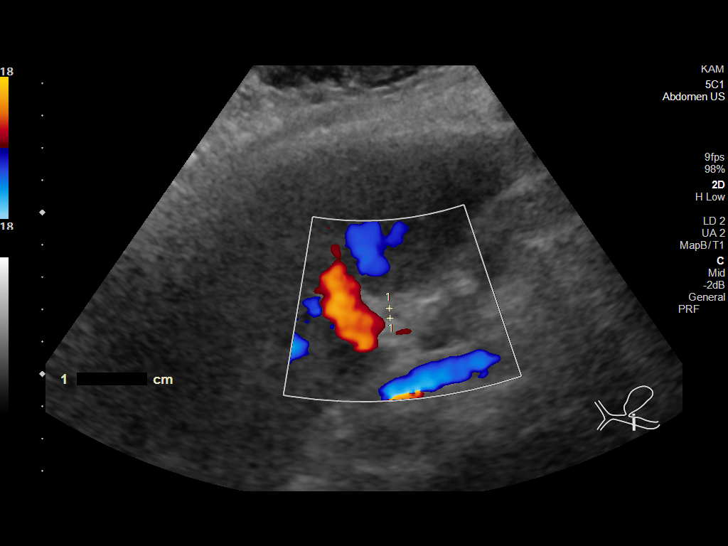
[im 45/83]
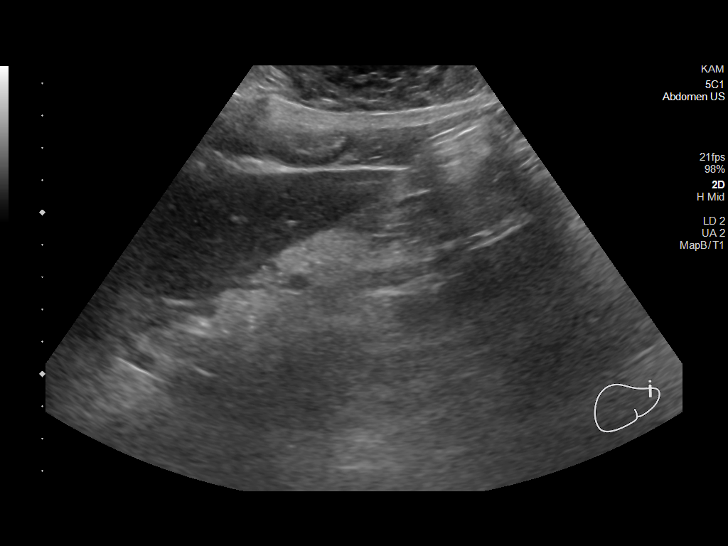
[im 52/83]
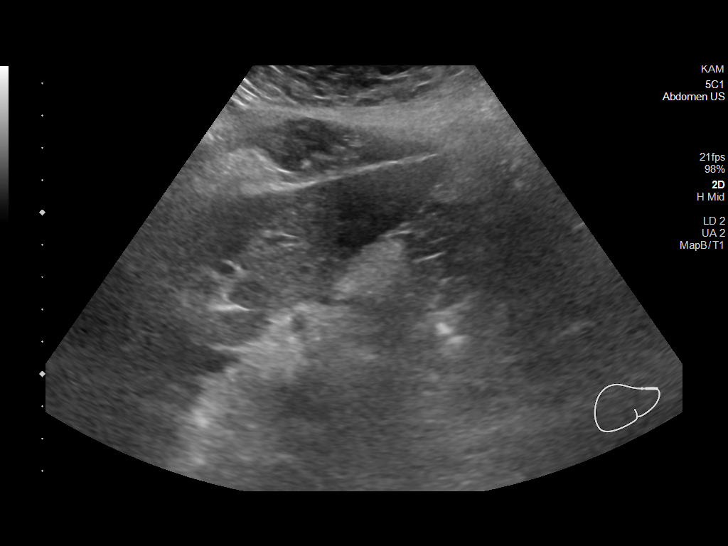
[im 55/83]
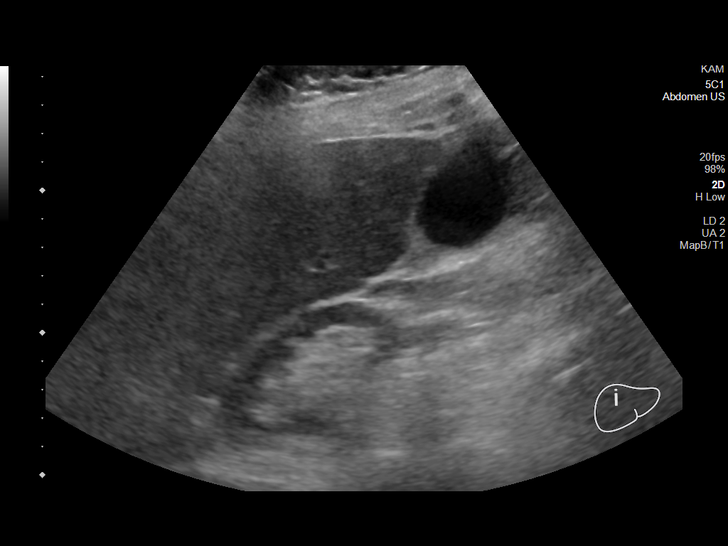
[im 62/83]
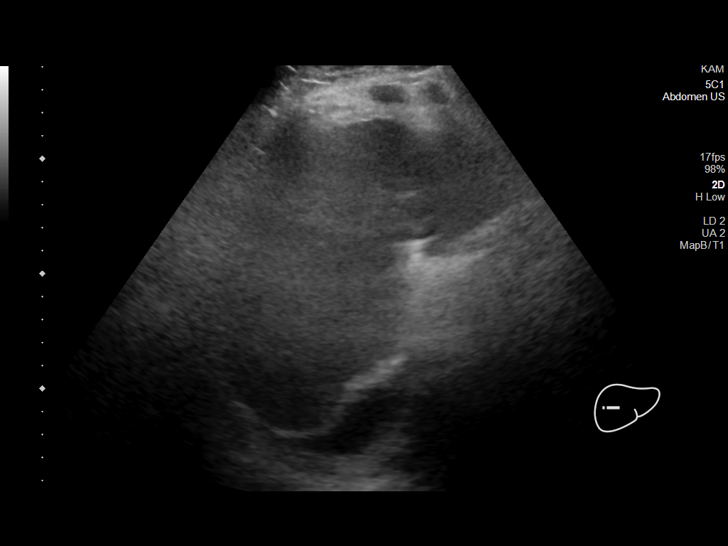
[im 69/83]
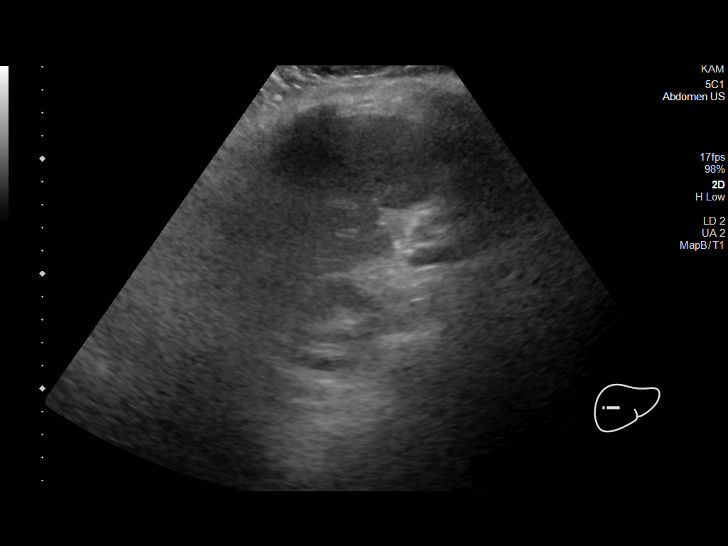
[im 76/83]
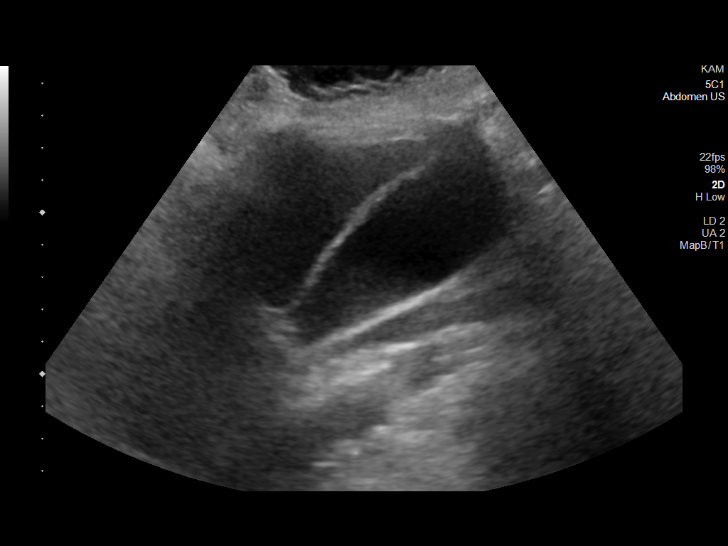
[im 83/83]
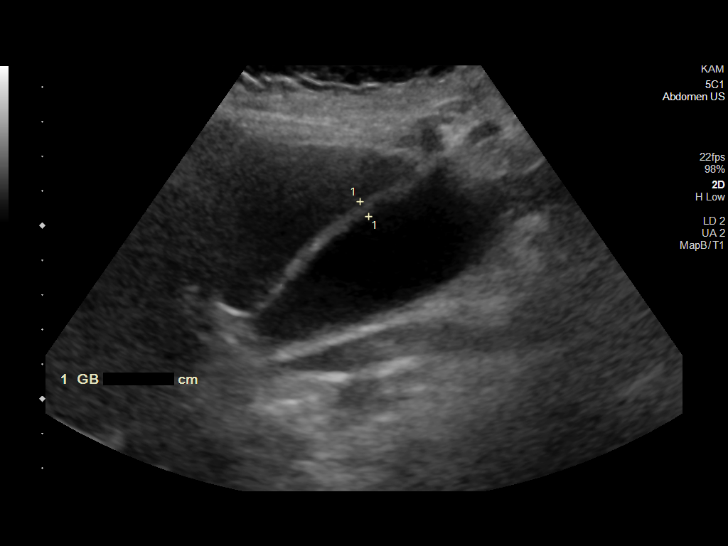

[14 of 25 positions shown; findings below may reference images not displayed]

FINDINGS: Gallbladder:

Gallbladder wall is prominent, measuring up to 5 mm. There is
moderate gallbladder distension. No stones. Small amount of sludge.
No pericholecystic fluid. No sonographic Murphy's sign.

Common bile duct:

Diameter: 3 mm

Liver:

Limited assessment due to bowel gas. Normal in overall size and
echogenicity. No masses. Portal vein is patent on color Doppler
imaging with normal direction of blood flow towards the liver.

Other: Right pleural effusion.
IMPRESSION: 1. Mild gallbladder wall thickening, which is nonspecific. No
stones. No convincing acute cholecystitis.

## 2022-08-20 MED ORDER — PRIMIDONE 50 MG PO TABS: ORAL_TABLET | ORAL | 0 refills | Status: DC

## 2022-08-20 NOTE — Addendum Note (Signed)
Addended by: Wanda Plump on: 08/20/2022 06:49 PM   Modules accepted: Orders

## 2022-09-17 ENCOUNTER — Telehealth: Payer: Self-pay | Admitting: Internal Medicine

## 2022-09-18 NOTE — Telephone Encounter (Signed)
pdmp okay, Rx sent

## 2022-09-18 NOTE — Telephone Encounter (Signed)
Requesting: alprazolam 0.5mg  Contract: 04/22/21 UDS: 02/11/22 Last Visit: 07/14/22 Next Visit: 01/15/23 Last Refill: 03/09/22 #270 and 0RF  Please Advise

## 2022-10-06 ENCOUNTER — Other Ambulatory Visit: Payer: Self-pay | Admitting: Internal Medicine

## 2022-10-08 ENCOUNTER — Encounter (INDEPENDENT_AMBULATORY_CARE_PROVIDER_SITE_OTHER): Payer: Self-pay

## 2022-10-21 ENCOUNTER — Other Ambulatory Visit: Payer: Self-pay | Admitting: Internal Medicine

## 2022-11-12 ENCOUNTER — Other Ambulatory Visit: Payer: Self-pay | Admitting: Internal Medicine

## 2022-11-18 ENCOUNTER — Ambulatory Visit: Payer: Medicare Other

## 2022-11-18 DIAGNOSIS — Z0000 Encounter for general adult medical examination without abnormal findings: Secondary | ICD-10-CM

## 2022-11-18 NOTE — Progress Notes (Signed)
Subjective:   Andrea Martin is a 87 y.o. female who presents for Medicare Annual (Subsequent) preventive examination.  Visit Complete: Virtual  I connected with  Revonda Standard on 11/18/22 by a audio enabled telemedicine application and verified that I am speaking with the correct person using two identifiers.  Patient Location: Home  Provider Location: Office/Clinic  I discussed the limitations of evaluation and management by telemedicine. The patient expressed understanding and agreed to proceed.   Cardiac Risk Factors include: advanced age (>65men, >29 women);dyslipidemia;hypertension     Objective:    Because this visit was a virtual/telehealth visit, some criteria may be missing or patient reported. Any vitals not documented were not able to be obtained and vitals that have been documented are patient reported.       11/18/2022    2:30 PM 11/17/2021    3:43 PM 10/22/2021    4:48 PM 09/14/2020    1:41 PM 08/31/2020    8:48 PM 08/28/2020    2:27 PM 08/28/2019   12:28 PM  Advanced Directives  Does Patient Have a Medical Advance Directive? Yes Yes No No No No Yes  Type of Estate agent of [place];Living will Healthcare Power of [place];Living will     Healthcare Power of [place];Living will  Does patient want to make changes to medical advance directive? No - Patient declined No - Patient declined     No - Patient declined  Copy of Healthcare Power of Attorney in Chart? Yes - validated most recent copy scanned in chart (See row information) No - copy requested     No - copy requested  Would patient like information on creating a medical advance directive?   No - Patient declined  Yes (ED - Information included in AVS) No - Patient declined     Current Medications (verified) Outpatient Encounter Medications as of 11/18/2022  Medication Sig   alendronate (FOSAMAX) 70 MG tablet TAKE 1 TABLET BY MOUTH EVERY 7  DAYS. TAKE WITH A FULL GLASS OF  WATER ON AN EMPTY  STOMACH.   ALPRAZolam (XANAX) 0.5 MG tablet TAKE 1/2 TO 1 TABLET BY MOUTH 3  TIMES DAILY AS NEEDED FOR  ANXIETY   buPROPion (WELLBUTRIN SR) 150 MG 12 hr tablet TAKE 2 TABLETS BY MOUTH DAILY   Calcium Carb-Cholecalciferol (CALCIUM 600+D3) 600-800 MG-UNIT TABS Take 1 tablet by mouth every morning.   Cholecalciferol (VITAMIN D3) 50 MCG (2000 UT) TABS Take 2,000 Units by mouth every morning.   donepezil (ARICEPT) 5 MG tablet Take 1 tablet (5 mg total) by mouth at bedtime.   escitalopram (LEXAPRO) 20 MG tablet TAKE 1 TABLET BY MOUTH DAILY   GLUCOSAMINE-CHONDROITIN PO Take 1 tablet by mouth every morning. Glucosamine 1500 mg, chondroitin 1200 mg   losartan (COZAAR) 50 MG tablet Take 1 tablet (50 mg total) by mouth daily.   Multiple Vitamin (MULTIVITAMIN WITH MINERALS) TABS tablet Take 1 tablet by mouth every morning.   Omega-3 Fatty Acids (FISH OIL) 600 MG CAPS Take 600 mg by mouth every morning.   pravastatin (PRAVACHOL) 40 MG tablet Take 1 tablet (40 mg total) by mouth at bedtime.   primidone (MYSOLINE) 50 MG tablet Take 1 tablet (50 mg total) by mouth at bedtime.   acetaminophen (TYLENOL) 500 MG tablet Take 2 tablets (1,000 mg total) by mouth every 6 (six) hours.   azelastine (ASTELIN) 0.1 % nasal spray Place 2 sprays into both nostrils at bedtime as needed for rhinitis. Use in each  nostril as directed   guaiFENesin (MUCINEX) 600 MG 12 hr tablet Take 2 tablets (1,200 mg total) by mouth 2 (two) times daily. (Patient not taking: Reported on 07/14/2022)   meclizine (ANTIVERT) 12.5 MG tablet Take 1 tablet (12.5 mg total) by mouth 3 (three) times daily as needed for dizziness. (Patient not taking: Reported on 07/14/2022)   [DISCONTINUED] polyethylene glycol powder (GLYCOLAX/MIRALAX) 17 GM/SCOOP powder Mix 1 capful in 8 oz of liquid and drink everyday   [DISCONTINUED] senna-docusate (SENOKOT-S) 8.6-50 MG tablet Take 1 tablet by mouth 2 (two) times daily. (Patient not taking: Reported on 07/14/2022)   No  facility-administered encounter medications on file as of 11/18/2022.    Allergies (verified) Codeine, Sulfa antibiotics, and Penicillins   History: Past Medical History:  Diagnosis Date   Anxiety and depression    Gait disorder    History of fractures due to falls 09/01/2020   HTN (hypertension) 09/01/2020   Hyperlipidemia    Neuropathy 08/04/2013   Shoulder dislocation    h/o after a fall   Past Surgical History:  Procedure Laterality Date   NO PAST SURGERIES     Family History  Problem Relation Age of Onset   Heart disease Father        CAD?   Coronary artery disease Brother 50   Heart disease Mother    Breast cancer Neg Hx    Colon cancer Neg Hx    Diabetes Neg Hx    Stroke Neg Hx    Social History   Socioeconomic History   Marital status: Widowed    Spouse name: Not on file   Number of children: 1   Years of education: Not on file   Highest education level: Not on file  Occupational History   Occupation: retired  Tobacco Use   Smoking status: Never   Smokeless tobacco: Never  Vaping Use   Vaping status: Never Used  Substance and Sexual Activity   Alcohol use: No   Drug use: No   Sexual activity: Not on file  Other Topics Concern   Not on file  Social History Narrative   Loss of her husband Elnita Maxwell 03/31/2021   Share the house w/ daughter and one of her 2 children    Social Determinants of Health   Financial Resource Strain: Low Risk  (11/18/2022)   Overall Financial Resource Strain (CARDIA)    Difficulty of Paying Living Expenses: Not hard at all  Food Insecurity: No Food Insecurity (11/18/2022)   Hunger Vital Sign    Worried About Running Out of Food in the Last Year: Never true    Ran Out of Food in the Last Year: Never true  Transportation Needs: No Transportation Needs (11/18/2022)   PRAPARE - Administrator, Civil Service (Medical): No    Lack of Transportation (Non-Medical): No  Physical Activity: Inactive (11/18/2022)   Exercise Vital  Sign    Days of Exercise per Week: 0 days    Minutes of Exercise per Session: 0 min  Stress: No Stress Concern Present (11/18/2022)   [employer] of Occupational Health - Occupational Stress Questionnaire    Feeling of Stress : Not at all  Social Connections: Moderately Integrated (11/18/2022)   Social Connection and Isolation Panel [NHANES]    Frequency of Communication with Friends and Family: Once a week    Frequency of Social Gatherings with Friends and Family: More than three times a week    Attends Religious Services: More than 4 times  per year    Active Member of Clubs or Organizations: Yes    Attends Banker Meetings: More than 4 times per year    Marital Status: Widowed    Tobacco Counseling Counseling given: Not Answered   Clinical Intake:  Pre-visit preparation completed: Yes  Pain : No/denies pain  Nutritional Risks: None Diabetes: No  How often do you need to have someone help you when you read instructions, pamphlets, or other written materials from your doctor or pharmacy?: 1 - Never  Interpreter Needed?: No  Information entered by :: Donne Anon, CMA   Activities of Daily Living    11/18/2022    2:32 PM  In your present state of health, do you have any difficulty performing the following activities:  Hearing? 1  Vision? 0  Difficulty concentrating or making decisions? 0  Walking or climbing stairs? 0  Dressing or bathing? 0  Doing errands, shopping? 1  Comment daughter Insurance claims handler and eating ? N  Using the Toilet? N  In the past six months, have you accidently leaked urine? N  Do you have problems with loss of bowel control? N  Managing your Medications? N  Managing your Finances? N  Housekeeping or managing your Housekeeping? N    Patient Care Team: Wanda Plump, MD as PCP - General (Internal Medicine) Ranee Gosselin, MD as Consulting Physician (Orthopedic Surgery) Vida Rigger, MD as Consulting Physician  (Gastroenterology)  Indicate any recent Medical Services you may have received from other than [facility] providers in the past year (date may be approximate).     Assessment:   This is a routine wellness examination for [place].  Hearing/Vision screen No results found.   Goals Addressed   None    Depression Screen    11/18/2022    2:44 PM 07/14/2022    3:29 PM 02/11/2022    2:07 PM 11/17/2021    3:43 PM 10/28/2021    4:02 PM 04/22/2021    4:34 PM 08/28/2020    2:31 PM  PHQ 2/9 Scores  PHQ - 2 Score 0 2 2 2 2 2 1   PHQ- 9 Score  8 7  8 8      Fall Risk    11/18/2022    2:37 PM 07/14/2022    3:14 PM 02/11/2022    2:05 PM 11/17/2021    3:41 PM 10/28/2021    4:08 PM  Fall Risk   Falls in the past year? 1 1 1 1 1   Number falls in past yr: 0 1 1 1 1   Injury with Fall? 0 1 0 0 1  Risk for fall due to : Impaired balance/gait   History of fall(s)   Follow up Falls evaluation completed Falls evaluation completed Falls evaluation completed Falls evaluation completed Falls evaluation completed    MEDICARE RISK AT HOME: Medicare Risk at Home Any stairs in or around the home?: Yes If so, are there any without handrails?: No Home free of loose throw rugs in walkways, pet beds, electrical cords, etc?: Yes Adequate lighting in your home to reduce risk of falls?: Yes Life alert?: No Use of a cane, walker or w/c?: Yes (occasionally) Grab bars in the bathroom?: Yes Shower chair or bench in shower?: Yes Elevated toilet seat or a handicapped toilet?: No  TIMED UP AND GO:  Was the test performed?  No    Cognitive Function:    06/20/2021    4:37 PM  MMSE - Mini Mental State  Exam  Orientation to time 4  Orientation to Place 5  Registration 3  Attention/ Calculation 5  Recall 0  Language- name 2 objects 2  Language- repeat 1  Language- follow 3 step command 2  Language- read & follow direction 1  Write a sentence 1  Copy design 1  Total score 25        11/18/2022    2:46 PM  11/17/2021    3:55 PM  6CIT Screen  What Year? 0 points 0 points  What month? 0 points 0 points  What time? 0 points 0 points  Count back from 20 0 points 0 points  Months in reverse 0 points 0 points  Repeat phrase 0 points 6 points  Total Score 0 points 6 points    Immunizations Immunization History  Administered Date(s) Administered   Fluad Quad(high Dose 65+) 12/19/2019, 12/19/2021   Influenza Split 12/25/2011, 11/19/2018   Influenza, High Dose Seasonal PF 12/09/2012, 11/29/2015   Influenza,inj,Quad PF,6+ Mos 12/08/2013   Influenza-Unspecified 12/24/2014, 12/10/2016, 12/29/2017   PFIZER Comirnaty(Gray Top)Covid-19 Tri-Sucrose Vaccine 07/30/2020   PFIZER(Purple Top)SARS-COV-2 Vaccination 03/16/2019, 04/06/2019, 02/22/2020   Pfizer(Comirnaty)Fall Seasonal Vaccine 12 years and older 12/19/2021   Pneumococcal Conjugate-13 08/04/2013   Pneumococcal Polysaccharide-23 01/23/2010, 05/04/2018   Td 04/27/2011   Tdap 12/19/2021   Zoster Recombinant(Shingrix) 11/03/2018, 01/13/2019   Zoster, Live 04/23/2009    TDAP status: Up to date  Flu Vaccine status: Due, Education has been provided regarding the importance of this vaccine. Advised may receive this vaccine at local pharmacy or Health Dept. Aware to provide a copy of the vaccination record if obtained from local pharmacy or Health Dept. Verbalized acceptance and understanding.  Pneumococcal vaccine status: Up to date  Covid-19 vaccine status: Information provided on how to obtain vaccines.   Qualifies for Shingles Vaccine? Yes   Zostavax completed Yes   Shingrix Completed?: Yes  Screening Tests Health Maintenance  Topic Date Due   COVID-19 Vaccine (6 - 2023-24 season) 10/25/2022   Medicare Annual Wellness (AWV)  11/18/2022   INFLUENZA VACCINE  05/24/2023 (Originally 09/24/2022)   DTaP/Tdap/Td (3 - Td or Tdap) 12/20/2031   Pneumonia Vaccine 54+ Years old  Completed   DEXA SCAN  Completed   Zoster Vaccines- Shingrix   Completed   HPV VACCINES  Aged Out    Health Maintenance  Health Maintenance Due  Topic Date Due   COVID-19 Vaccine (6 - 2023-24 season) 10/25/2022   Medicare Annual Wellness (AWV)  11/18/2022    Colorectal cancer screening: No longer required.   Mammogram status: No longer required due to age.  Bone Density status: pt declined at this time  Lung Cancer Screening: (Low Dose CT Chest recommended if Age 55-80 years, 20 pack-year currently smoking OR have quit w/in 15years.) does not qualify.   Additional Screening:  Hepatitis C Screening: does not qualify  Vision Screening: Recommended annual ophthalmology exams fr early detection of glaucoma and other disorders of the eye. Is the patient up to date with their annual eye exam?  Yes  Who is the provider or what is the name of the office in which the patient attends annual eye exams? [facility] If pt is not established with a provider, would they like to be referred to a provider to establish care? No .   Dental Screening: Recommended annual dental exams for proper oral hygiene  Diabetic Foot Exam: N/a  Community Resource Referral / Chronic Care Management: CRR required this visit?  No   CCM  required this visit?  No     Plan:     I have personally reviewed and noted the following in the patient's chart:   Medical and social history Use of alcohol, tobacco or illicit drugs  Current medications and supplements including opioid prescriptions. Patient is not currently taking opioid prescriptions. Functional ability and status Nutritional status Physical activity Advanced directives List of other physicians Hospitalizations, surgeries, and ER visits in previous 12 months Vitals Screenings to include cognitive, depression, and falls Referrals and appointments  In addition, I have reviewed and discussed with patient certain preventive protocols, quality metrics, and best practice recommendations. A written  personalized care plan for preventive services as well as general preventive health recommendations were provided to patient.     Donne Anon, CMA   11/18/2022   After Visit Summary: (MyChart) Due to this being a telephonic visit, the after visit summary with patients personalized plan was offered to patient via MyChart   Nurse Notes: None

## 2022-11-18 NOTE — Patient Instructions (Signed)
Andrea Martin , Thank you for taking time to come for your Medicare Wellness Visit. I appreciate your ongoing commitment to your health goals. Please review the following plan we discussed and let me know if I can assist you in the future.     This is a list of the screening recommended for you and due dates:  Health Maintenance  Topic Date Due   COVID-19 Vaccine (6 - 2023-24 season) 10/25/2022   Flu Shot  05/24/2023*   Medicare Annual Wellness Visit  11/18/2023   DTaP/Tdap/Td vaccine (3 - Td or Tdap) 12/20/2031   Pneumonia Vaccine  Completed   DEXA scan (bone density measurement)  Completed   Zoster (Shingles) Vaccine  Completed   HPV Vaccine  Aged Out  *Topic was postponed. The date shown is not the original due date.     Next appointment: Follow up in one year for your annual wellness visit    Preventive Care 65 Years and Older, Female Preventive care refers to lifestyle choices and visits with your health care provider that can promote health and wellness. What does preventive care include? A yearly physical exam. This is also called an annual well check. Dental exams once or twice a year. Routine eye exams. Ask your health care provider how often you should have your eyes checked. Personal lifestyle choices, including: Daily care of your teeth and gums. Regular physical activity. Eating a healthy diet. Avoiding tobacco and drug use. Limiting alcohol use. Practicing safe sex. Taking low-dose aspirin every day. Taking vitamin and mineral supplements as recommended by your health care provider. What happens during an annual well check? The services and screenings done by your health care provider during your annual well check will depend on your age, overall health, lifestyle risk factors, and family history of disease. Counseling  Your health care provider may ask you questions about your: Alcohol use. Tobacco use. Drug use. Emotional well-being. Home and relationship  well-being. Sexual activity. Eating habits. History of falls. Memory and ability to understand (cognition). Work and work Astronomer. Reproductive health. Screening  You may have the following tests or measurements: Height, weight, and BMI. Blood pressure. Lipid and cholesterol levels. These may be checked every 5 years, or more frequently if you are over 85 years old. Skin check. Lung cancer screening. You may have this screening every year starting at age 85 if you have a 30-pack-year history of smoking and currently smoke or have quit within the past 15 years. Fecal occult blood test (FOBT) of the stool. You may have this test every year starting at age 85. Flexible sigmoidoscopy or colonoscopy. You may have a sigmoidoscopy every 5 years or a colonoscopy every 10 years starting at age 85. Hepatitis C blood test. Hepatitis B blood test. Sexually transmitted disease (STD) testing. Diabetes screening. This is done by checking your blood sugar (glucose) after you have not eaten for a while (fasting). You may have this done every 1-3 years. Bone density scan. This is done to screen for osteoporosis. You may have this done starting at age 85. Mammogram. This may be done every 1-2 years. Talk to your health care provider about how often you should have regular mammograms. Talk with your health care provider about your test results, treatment options, and if necessary, the need for more tests. Vaccines  Your health care provider may recommend certain vaccines, such as: Influenza vaccine. This is recommended every year. Tetanus, diphtheria, and acellular pertussis (Tdap, Td) vaccine. You may need a  Td booster every 10 years. Zoster vaccine. You may need this after age 2. Pneumococcal 13-valent conjugate (PCV13) vaccine. One dose is recommended after age 20. Pneumococcal polysaccharide (PPSV23) vaccine. One dose is recommended after age 28. Talk to your health care provider about which  screenings and vaccines you need and how often you need them. This information is not intended to replace advice given to you by your health care provider. Make sure you discuss any questions you have with your health care provider. Document Released: 03/08/2015 Document Revised: 10/30/2015 Document Reviewed: 12/11/2014 Elsevier Interactive Patient Education  2017 [employer].  Fall Prevention in the Home Falls can cause injuries. They can happen to people of all ages. There are many things you can do to make your home safe and to help prevent falls. What can I do on the outside of my home? Regularly fix the edges of walkways and driveways and fix any cracks. Remove anything that might make you trip as you walk through a door, such as a raised step or threshold. Trim any bushes or trees on the path to your home. Use bright outdoor lighting. Clear any walking paths of anything that might make someone trip, such as rocks or tools. Regularly check to see if handrails are loose or broken. Make sure that both sides of any steps have handrails. Any raised decks and porches should have guardrails on the edges. Have any leaves, snow, or ice cleared regularly. Use sand or salt on walking paths during winter. Clean up any spills in your garage right away. This includes oil or grease spills. What can I do in the bathroom? Use night lights. Install grab bars by the toilet and in the tub and shower. Do not use towel bars as grab bars. Use non-skid mats or decals in the tub or shower. If you need to sit down in the shower, use a plastic, non-slip stool. Keep the floor dry. Clean up any water that spills on the floor as soon as it happens. Remove soap buildup in the tub or shower regularly. Attach bath mats securely with double-sided non-slip rug tape. Do not have throw rugs and other things on the floor that can make you trip. What can I do in the bedroom? Use night lights. Make sure that you have a  light by your bed that is easy to reach. Do not use any sheets or blankets that are too big for your bed. They should not hang down onto the floor. Have a firm chair that has side arms. You can use this for support while you get dressed. Do not have throw rugs and other things on the floor that can make you trip. What can I do in the kitchen? Clean up any spills right away. Avoid walking on wet floors. Keep items that you use a lot in easy-to-reach places. If you need to reach something above you, use a strong step stool that has a grab bar. Keep electrical cords out of the way. Do not use floor polish or wax that makes floors slippery. If you must use wax, use non-skid floor wax. Do not have throw rugs and other things on the floor that can make you trip. What can I do with my stairs? Do not leave any items on the stairs. Make sure that there are handrails on both sides of the stairs and use them. Fix handrails that are broken or loose. Make sure that handrails are as long as the stairways. Check any  carpeting to make sure that it is firmly attached to the stairs. Fix any carpet that is loose or worn. Avoid having throw rugs at the top or bottom of the stairs. If you do have throw rugs, attach them to the floor with carpet tape. Make sure that you have a light switch at the top of the stairs and the bottom of the stairs. If you do not have them, ask someone to add them for you. What else can I do to help prevent falls? Wear shoes that: Do not have high heels. Have rubber bottoms. Are comfortable and fit you well. Are closed at the toe. Do not wear sandals. If you use a stepladder: Make sure that it is fully opened. Do not climb a closed stepladder. Make sure that both sides of the stepladder are locked into place. Ask someone to hold it for you, if possible. Clearly mark and make sure that you can see: Any grab bars or handrails. First and last steps. Where the edge of each step  is. Use tools that help you move around (mobility aids) if they are needed. These include: Canes. Walkers. Scooters. Crutches. Turn on the lights when you go into a dark area. Replace any light bulbs as soon as they burn out. Set up your furniture so you have a clear path. Avoid moving your furniture around. If any of your floors are uneven, fix them. If there are any pets around you, be aware of where they are. Review your medicines with your doctor. Some medicines can make you feel dizzy. This can increase your chance of falling. Ask your doctor what other things that you can do to help prevent falls. This information is not intended to replace advice given to you by your health care provider. Make sure you discuss any questions you have with your health care provider. Document Released: 12/06/2008 Document Revised: 07/18/2015 Document Reviewed: 03/16/2014 Elsevier Interactive Patient Education  2017 [employer].

## 2022-11-30 ENCOUNTER — Other Ambulatory Visit: Payer: Self-pay | Admitting: Internal Medicine

## 2023-01-11 ENCOUNTER — Other Ambulatory Visit: Payer: Self-pay | Admitting: Internal Medicine

## 2023-01-15 ENCOUNTER — Ambulatory Visit (INDEPENDENT_AMBULATORY_CARE_PROVIDER_SITE_OTHER): Payer: Medicare Other | Admitting: Internal Medicine

## 2023-01-15 ENCOUNTER — Encounter: Payer: Self-pay | Admitting: Internal Medicine

## 2023-01-15 VITALS — BP 126/68 | HR 72 | Temp 98.3°F | Resp 16 | Ht 66.0 in | Wt 141.2 lb

## 2023-01-15 DIAGNOSIS — R269 Unspecified abnormalities of gait and mobility: Secondary | ICD-10-CM

## 2023-01-15 DIAGNOSIS — G3184 Mild cognitive impairment, so stated: Secondary | ICD-10-CM

## 2023-01-15 DIAGNOSIS — I10 Essential (primary) hypertension: Secondary | ICD-10-CM

## 2023-01-15 DIAGNOSIS — E785 Hyperlipidemia, unspecified: Secondary | ICD-10-CM

## 2023-01-15 DIAGNOSIS — E038 Other specified hypothyroidism: Secondary | ICD-10-CM

## 2023-01-15 DIAGNOSIS — Z23 Encounter for immunization: Secondary | ICD-10-CM

## 2023-01-15 NOTE — Patient Instructions (Addendum)
Vaccines I recommend: Covid booster  Will arrange for a physical therapy to visit you at home.    Next visit with me in 4 months.  Please schedule it at the front desk

## 2023-01-15 NOTE — Progress Notes (Unsigned)
Subjective:    Patient ID: Andrea Martin, female    DOB: 1935/11/01, 87 y.o.   MRN: [id number]  DOS:  01/15/2023 Type of visit - description: ROV  Overall feeling well.  Mood is better.  She is by herself today however she tells me her memory is about the same, she is just a little forgetful.  Did sustain a fall 3 weeks ago, landed on her right knee.  No head injury.  No recent ambulatory BPs.  Review of Systems Denies chest pain or difficulty breathing. No palpitations, no syncope.   Past Medical History:  Diagnosis Date   Anxiety and depression    Gait disorder    History of fractures due to falls 09/01/2020   HTN (hypertension) 09/01/2020   Hyperlipidemia    Neuropathy 08/04/2013   Shoulder dislocation    h/o after a fall    Past Surgical History:  Procedure Laterality Date   NO PAST SURGERIES      Current Outpatient Medications  Medication Instructions   acetaminophen (TYLENOL) 1,000 mg, Oral, Every 6 hours   alendronate (FOSAMAX) 70 MG tablet TAKE 1 TABLET BY MOUTH EVERY 7  DAYS. TAKE WITH A FULL GLASS OF  WATER ON AN EMPTY STOMACH.   ALPRAZolam (XANAX) 0.5 MG tablet TAKE 1/2 TO 1 TABLET BY MOUTH 3  TIMES DAILY AS NEEDED FOR  ANXIETY   azelastine (ASTELIN) 0.1 % nasal spray 2 sprays, Each Nare, At bedtime PRN, Use in each nostril as directed   buPROPion (WELLBUTRIN SR) 300 mg, Oral, Daily   Calcium Carb-Cholecalciferol (CALCIUM 600+D3) 600-800 MG-UNIT TABS 1 tablet, Oral, Every morning   donepezil (ARICEPT) 5 mg, Oral, Daily at bedtime   escitalopram (LEXAPRO) 20 mg, Oral, Daily   Fish Oil 600 mg, Oral, Every morning   GLUCOSAMINE-CHONDROITIN PO 1 tablet, Oral, Every morning, Glucosamine 1500 mg, chondroitin 1200 mg   losartan (COZAAR) 50 mg, Oral, Daily   meclizine (ANTIVERT) 12.5 mg, Oral, 3 times daily PRN   Multiple Vitamin (MULTIVITAMIN WITH MINERALS) TABS tablet 1 tablet, Oral, Every morning   pravastatin (PRAVACHOL) 40 mg, Oral, Daily at bedtime    primidone (MYSOLINE) 50 mg, Oral, Daily at bedtime   Vitamin D3 2,000 Units, Oral, Every morning       Objective:   Physical Exam BP 126/68   Pulse 72   Temp 98.3 F (36.8 C) (Oral)   Resp 16   Ht 5\' 6"  (1.676 m)   Wt 141 lb 4 oz (64.1 kg)   SpO2 97%   BMI 22.80 kg/m  General:   Well developed, NAD, BMI noted. HEENT:  Normocephalic . Face symmetric, atraumatic Lungs:  CTA B Normal respiratory effort, no intercostal retractions, no accessory muscle use. Heart: Regular?,  no murmur.  Lower extremities: no pretibial edema bilaterally  Skin: Not pale. Not jaundice Neurologic:  alert & oriented to self, space some time. Speech normal, gait   unassisted, needed some help transferring. Psych--  Behavior appropriate. No anxious or depressed appearing.      Assessment     Assessment   HTN: DX 10/2019. Hyperlipidemia.  Simvastatin intolerant.  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 Gait d/o, falls  Cerebellar: Remote infarct per MRI 10-2018 Osteoporosis:  T-score -1.6 2020, had a refracture, started Fosamax 10-2021  PLAN: HTN:  BP seems  well-controlled, continue losartan.  Check a BMP. EKG today: Frequent PACs.  Sinus rhythm. PACs: asx, observation High cholesterol: Few months ago increase Pravachol to 40 mg, check FLP AST ALT. Anxiety depression: Overall she feels better, still on on Wellbutrin, Lexapro, and Xanax half tablet 3 times daily. MCI: Here by herself, seems at baseline, on Aricept.  She lives with her daughter, her family takes care of bills and other affairs.  The patient does not drive Gait disorder: Had another fall since the last visit, rec PT at home.  Also encouraged to use a walker or cane, she is somewhat resistant to that. Subclinical hypothyroidism: Check TSH and free  T4. Vaccine advised: Flu shot today, recommend COVID-vaccine. RTC 4 m

## 2023-01-16 LAB — LIPID PANEL
Cholesterol: 199 mg/dL (ref <200)
HDL: 53 mg/dL (ref >=50)
LDL Cholesterol (Calc): 121 mg/dL — ABNORMAL HIGH
Non-HDL Cholesterol (Calc): 146 mg/dL — ABNORMAL HIGH (ref <130)
Total CHOL/HDL Ratio: 3.8 (calc) (ref <5.0)
Triglycerides: 133 mg/dL (ref <150)

## 2023-01-16 LAB — ALT: ALT: 13 U/L (ref 6–29)

## 2023-01-16 LAB — BASIC METABOLIC PANEL
BUN/Creatinine Ratio: 20 (calc) (ref 6–22)
BUN: 20 mg/dL (ref 7–25)
CO2: 29 mmol/L (ref 20–32)
Calcium: 9.5 mg/dL (ref 8.6–10.4)
Chloride: 104 mmol/L (ref 98–110)
Creat: 1 mg/dL — ABNORMAL HIGH (ref 0.60–0.95)
Glucose, Bld: 85 mg/dL (ref 65–99)
Potassium: 4.2 mmol/L (ref 3.5–5.3)
Sodium: 140 mmol/L (ref 135–146)

## 2023-01-16 LAB — AST: AST: 16 U/L (ref 10–35)

## 2023-01-16 LAB — T4, FREE: Free T4: 1.1 ng/dL (ref 0.8–1.8)

## 2023-01-16 LAB — TSH: TSH: 5.68 m[IU]/L — ABNORMAL HIGH (ref 0.40–4.50)

## 2023-01-17 NOTE — Assessment & Plan Note (Signed)
HTN:  BP seems well-controlled, continue losartan.  Check a BMP. EKG today: Frequent PACs.  Sinus rhythm. PACs: asx, observation High cholesterol: Few months ago increase Pravachol to 40 mg, check FLP AST ALT. Anxiety depression: Overall she feels better, still on on Wellbutrin, Lexapro, and Xanax half tablet 3 times daily. MCI: Here by herself, seems at baseline, on Aricept.  She lives with her daughter, her family takes care of bills and other affairs.  The patient does not drive Gait disorder: Had another fall since the last visit, rec PT at home.  Also encouraged to use a walker or cane, she is somewhat resistant to that. Subclinical hypothyroidism: Check TSH and free T4. Vaccine advised: Flu shot today, recommend COVID-vaccine. RTC 4 m

## 2023-01-20 ENCOUNTER — Telehealth: Payer: Self-pay | Admitting: Internal Medicine

## 2023-01-20 NOTE — Telephone Encounter (Signed)
yes

## 2023-01-20 NOTE — Telephone Encounter (Signed)
Caller/Agency: [facility]  Callback Number: [phone] Requesting OT/PT/Skilled Nursing/Social Work/Speech Therapy: PT Frequency:  NA   Requesting to delay start of care for PT to 01/26/23.

## 2023-01-20 NOTE — Telephone Encounter (Signed)
Spoke w/ Geraldine Contras- informed okay to delay start until next week.

## 2023-01-20 NOTE — Telephone Encounter (Signed)
Darliss Cheney to delay home health PT until next week?

## 2023-01-21 ENCOUNTER — Other Ambulatory Visit: Payer: Self-pay | Admitting: Internal Medicine

## 2023-01-26 ENCOUNTER — Telehealth: Payer: Self-pay | Admitting: Internal Medicine

## 2023-01-27 DIAGNOSIS — I10 Essential (primary) hypertension: Secondary | ICD-10-CM | POA: Diagnosis not present

## 2023-01-27 DIAGNOSIS — G3184 Mild cognitive impairment, so stated: Secondary | ICD-10-CM | POA: Diagnosis not present

## 2023-01-27 DIAGNOSIS — M810 Age-related osteoporosis without current pathological fracture: Secondary | ICD-10-CM | POA: Diagnosis not present

## 2023-01-27 DIAGNOSIS — G629 Polyneuropathy, unspecified: Secondary | ICD-10-CM | POA: Diagnosis not present

## 2023-01-27 DIAGNOSIS — E039 Hypothyroidism, unspecified: Secondary | ICD-10-CM | POA: Diagnosis not present

## 2023-01-27 NOTE — Telephone Encounter (Signed)
Requesting: alprazolam 0.5mg  Contract: 02/11/22 UDS: 02/11/22 Last Visit: 01/15/23 Next Visit: 05/17/23 Last Refill: 09/18/22 #270 and 0RF   Please Advise

## 2023-01-27 NOTE — Telephone Encounter (Signed)
PDMP okay, Rx sent 

## 2023-02-08 ENCOUNTER — Telehealth: Payer: Self-pay

## 2023-02-08 DIAGNOSIS — M810 Age-related osteoporosis without current pathological fracture: Secondary | ICD-10-CM

## 2023-02-08 DIAGNOSIS — R7303 Prediabetes: Secondary | ICD-10-CM

## 2023-02-08 DIAGNOSIS — Z9181 History of falling: Secondary | ICD-10-CM

## 2023-02-08 DIAGNOSIS — Z87891 Personal history of nicotine dependence: Secondary | ICD-10-CM

## 2023-02-08 DIAGNOSIS — D649 Anemia, unspecified: Secondary | ICD-10-CM

## 2023-02-08 DIAGNOSIS — E890 Postprocedural hypothyroidism: Secondary | ICD-10-CM

## 2023-02-08 DIAGNOSIS — I2510 Atherosclerotic heart disease of native coronary artery without angina pectoris: Secondary | ICD-10-CM

## 2023-02-08 DIAGNOSIS — Z7982 Long term (current) use of aspirin: Secondary | ICD-10-CM

## 2023-02-08 DIAGNOSIS — I10 Essential (primary) hypertension: Secondary | ICD-10-CM

## 2023-02-08 DIAGNOSIS — F0284 Dementia in other diseases classified elsewhere, unspecified severity, with anxiety: Secondary | ICD-10-CM

## 2023-02-08 DIAGNOSIS — G301 Alzheimer's disease with late onset: Secondary | ICD-10-CM

## 2023-02-08 NOTE — Telephone Encounter (Signed)
Form signed (order number: [id number]) and faxed back to [facility] at [phone]. Form sent for scanning

## 2023-02-26 DIAGNOSIS — I10 Essential (primary) hypertension: Secondary | ICD-10-CM | POA: Diagnosis not present

## 2023-02-26 DIAGNOSIS — M810 Age-related osteoporosis without current pathological fracture: Secondary | ICD-10-CM | POA: Diagnosis not present

## 2023-02-26 DIAGNOSIS — E039 Hypothyroidism, unspecified: Secondary | ICD-10-CM | POA: Diagnosis not present

## 2023-02-26 DIAGNOSIS — G3184 Mild cognitive impairment, so stated: Secondary | ICD-10-CM | POA: Diagnosis not present

## 2023-02-26 DIAGNOSIS — G629 Polyneuropathy, unspecified: Secondary | ICD-10-CM | POA: Diagnosis not present

## 2023-03-04 ENCOUNTER — Telehealth: Payer: Self-pay

## 2023-03-04 DIAGNOSIS — G3184 Mild cognitive impairment, so stated: Secondary | ICD-10-CM | POA: Diagnosis not present

## 2023-03-04 DIAGNOSIS — E039 Hypothyroidism, unspecified: Secondary | ICD-10-CM | POA: Diagnosis not present

## 2023-03-04 DIAGNOSIS — G629 Polyneuropathy, unspecified: Secondary | ICD-10-CM | POA: Diagnosis not present

## 2023-03-04 DIAGNOSIS — I10 Essential (primary) hypertension: Secondary | ICD-10-CM | POA: Diagnosis not present

## 2023-03-04 DIAGNOSIS — M810 Age-related osteoporosis without current pathological fracture: Secondary | ICD-10-CM | POA: Diagnosis not present

## 2023-03-04 NOTE — Telephone Encounter (Signed)
 Physician orders (order number [id number]) signed and faxed back to [facility] at [phone]. Form sent for scanning.

## 2023-04-07 ENCOUNTER — Encounter: Payer: Self-pay | Admitting: Internal Medicine

## 2023-04-10 ENCOUNTER — Telehealth: Payer: Medicare Other | Admitting: Family Medicine

## 2023-04-10 DIAGNOSIS — R399 Unspecified symptoms and signs involving the genitourinary system: Secondary | ICD-10-CM

## 2023-04-10 MED ORDER — NITROFURANTOIN MONOHYD MACRO 100 MG PO CAPS: 100.0000 mg | ORAL_CAPSULE | Freq: Two times a day (BID) | ORAL | 0 refills | Status: AC

## 2023-04-10 NOTE — Patient Instructions (Signed)
Revonda Standard, thank you for joining Reed Pandy, PA-C for today's virtual visit.  While this provider is not your primary care provider (PCP), if your PCP is located in our provider database this encounter information will be shared with them immediately following your visit.   A [facility] MyChart account gives you access to today's visit and all your visits, tests, and labs performed at [facility] " click here if you don't have a [facility] MyChart account or go to [email or web address]  Consent: (Patient) Andrea Martin provided verbal consent for this virtual visit at the beginning of the encounter.  Current Medications:  Current Outpatient Medications:    nitrofurantoin, macrocrystal-monohydrate, (MACROBID) 100 MG capsule, Take 1 capsule (100 mg total) by mouth 2 (two) times daily for 5 days., Disp: 10 capsule, Rfl: 0   acetaminophen (TYLENOL) 500 MG tablet, Take 2 tablets (1,000 mg total) by mouth every 6 (six) hours., Disp: 30 tablet, Rfl: 0   alendronate (FOSAMAX) 70 MG tablet, TAKE 1 TABLET BY MOUTH EVERY 7  DAYS. TAKE WITH A FULL GLASS OF  WATER ON AN EMPTY STOMACH., Disp: 12 tablet, Rfl: 3   ALPRAZolam (XANAX) 0.5 MG tablet, TAKE 1/2 TO 1 TABLET BY MOUTH 3  TIMES DAILY AS NEEDED FOR  ANXIETY, Disp: 270 tablet, Rfl: 0   azelastine (ASTELIN) 0.1 % nasal spray, Place 2 sprays into both nostrils at bedtime as needed for rhinitis. Use in each nostril as directed, Disp: 30 mL, Rfl: 3   buPROPion (WELLBUTRIN SR) 150 MG 12 hr tablet, TAKE 2 TABLETS BY MOUTH DAILY, Disp: 180 tablet, Rfl: 3   Calcium Carb-Cholecalciferol (CALCIUM 600+D3) 600-800 MG-UNIT TABS, Take 1 tablet by mouth every morning., Disp: , Rfl:    Cholecalciferol (VITAMIN D3) 50 MCG (2000 UT) TABS, Take 2,000 Units by mouth every morning., Disp: , Rfl:    donepezil (ARICEPT) 5 MG tablet, TAKE 1 TABLET BY MOUTH AT  BEDTIME, Disp: 100 tablet, Rfl: 2   escitalopram (LEXAPRO) 20 MG tablet, TAKE 1 TABLET BY  MOUTH DAILY, Disp: 90 tablet, Rfl: 3   GLUCOSAMINE-CHONDROITIN PO, Take 1 tablet by mouth every morning. Glucosamine 1500 mg, chondroitin 1200 mg, Disp: , Rfl:    losartan (COZAAR) 50 MG tablet, TAKE 1 TABLET BY MOUTH DAILY, Disp: 100 tablet, Rfl: 2   meclizine (ANTIVERT) 12.5 MG tablet, Take 1 tablet (12.5 mg total) by mouth 3 (three) times daily as needed for dizziness. (Patient not taking: Reported on 07/14/2022), Disp: 30 tablet, Rfl: 0   Multiple Vitamin (MULTIVITAMIN WITH MINERALS) TABS tablet, Take 1 tablet by mouth every morning., Disp: , Rfl:    Omega-3 Fatty Acids (FISH OIL) 600 MG CAPS, Take 600 mg by mouth every morning., Disp: , Rfl:    pravastatin (PRAVACHOL) 40 MG tablet, TAKE 1 TABLET BY MOUTH AT  BEDTIME, Disp: 100 tablet, Rfl: 2   primidone (MYSOLINE) 50 MG tablet, Take 1 tablet (50 mg total) by mouth at bedtime., Disp: 90 tablet, Rfl: 1   Medications ordered in this encounter:  Meds ordered this encounter  Medications   nitrofurantoin, macrocrystal-monohydrate, (MACROBID) 100 MG capsule    Sig: Take 1 capsule (100 mg total) by mouth 2 (two) times daily for 5 days.    Dispense:  10 capsule    Refill:  0     *If you need refills on other medications prior to your next appointment, please contact your pharmacy*  Follow-Up: Call back or seek an in-person evaluation if the symptoms  worsen or if the condition fails to improve as anticipated.  [facility] [phone]  Other Instructions Urinary Tract Infection, Female A urinary tract infection (UTI) is an infection in your urinary tract. The urinary tract is made up of organs that make, store, and get rid of pee (urine) in your body. These organs include: The kidneys. The ureters. The bladder. The urethra. What are the causes? Most UTIs are caused by germs called bacteria. They may be in or near your genitals. These germs grow and cause swelling in your urinary tract. What increases the risk? You're more  likely to get a UTI if: You're a female. The urethra is shorter in females than in males. You have a soft tube called a catheter that drains your pee. You can't control when you pee or poop. You have trouble peeing because of: A kidney stone. A urinary blockage. A nerve condition that affects your bladder. Not getting enough to drink. You're sexually active. You use a birth control inside your vagina, like spermicide. You're pregnant. You have low levels of the hormone estrogen in your body. You're an older adult. You're also more likely to get a UTI if you have other health problems. These may include: Diabetes. A weak immune system. Your immune system is your body's defense system. Sickle cell disease. Injury of the spine. What are the signs or symptoms? Symptoms may include: Needing to pee right away. Peeing small amounts often. Pain or burning when you pee. Blood in your pee. Pee that smells bad or odd. Pain in your belly or lower back. You may also: Feel confused. This may be the first symptom in older adults. Vomit. Not feel hungry. Feel tired or easily annoyed. Have a fever or chills. How is this diagnosed? A UTI is diagnosed based on your medical history and an exam. You may also have other tests. These may include: Pee tests. Blood tests. Tests for sexually transmitted infections (STIs). If you've had more than one UTI, you may need to have imaging studies done to find out why you keep getting them. How is this treated? A UTI can be treated by: Taking antibiotics or other medicines. Drinking enough fluid to keep your pee pale yellow. In rare cases, a UTI can cause a very bad condition called sepsis. Sepsis may be treated in the hospital. Follow these instructions at home: Medicines Take your medicines only as told by your health care provider. If you were given antibiotics, take them as told by your provider. Do not stop taking them even if you start to feel  better. General instructions Make sure you: Pee often and fully. Do not hold your pee for a long time. Wipe from front to back after you pee or poop. Use each tissue only once when you wipe. Pee after you have sex. Do not douche or use sprays or powders in your genital area. Contact a health care provider if: Your symptoms don't get better after 1-2 days of taking antibiotics. Your symptoms go away and then come back. You have a fever or chills. You vomit or feel like you may vomit. Get help right away if: You have very bad pain in your back or lower belly. You faint. This information is not intended to replace advice given to you by your health care provider. Make sure you discuss any questions you have with your health care provider. Document Revised: 09/17/2022 Document Reviewed: 05/15/2022 Elsevier Patient Education  2024 [employer].  If you have been instructed to have an in-person evaluation today at a local Urgent Care facility, please use the link below. It will take you to a list of all of our available [facility], including address, phone number and hours of operation. Please do not delay care.  [facility]  If you or a family member do not have a primary care provider, use the link below to schedule a visit and establish care. When you choose a [facility] primary care physician or advanced practice provider, you gain a long-term partner in health. Find a Primary Care Provider  Learn more about [facility]'s in-office and virtual care options: [facility] - Get Care Now

## 2023-04-10 NOTE — Progress Notes (Signed)
Virtual Visit Consent   Andrea Martin, you are scheduled for a virtual visit with a [facility] provider today. Just as with appointments in the office, your consent must be obtained to participate. Your consent will be active for this visit and any virtual visit you may have with one of our providers in the next 365 days. If you have a MyChart account, a copy of this consent can be sent to you electronically.  As this is a virtual visit, video technology does not allow for your provider to perform a traditional examination. This may limit your provider's ability to fully assess your condition. If your provider identifies any concerns that need to be evaluated in person or the need to arrange testing (such as labs, EKG, etc.), we will make arrangements to do so. Although advances in technology are sophisticated, we cannot ensure that it will always work on either your end or our end. If the connection with a video visit is poor, the visit may have to be switched to a telephone visit. With either a video or telephone visit, we are not always able to ensure that we have a secure connection.  By engaging in this virtual visit, you consent to the provision of healthcare and authorize for your insurance to be billed (if applicable) for the services provided during this visit. Depending on your insurance coverage, you may receive a charge related to this service.  I need to obtain your verbal consent now. Are you willing to proceed with your visit today? TRAMEKA DOROUGH has provided verbal consent on 04/10/2023 for a virtual visit (video or telephone). Andrea Martin, [place]  Date: 04/10/2023 12:32 PM   Virtual Visit via Video Note   I, Andrea Martin, connected with  Andrea Martin  ([id number], 1935-07-20) on 04/10/23 at 12:30 PM EST by a video-enabled telemedicine application and verified that I am speaking with the correct person using two identifiers.  Location: Patient: Virtual Visit Location Patient:  Home Provider: Virtual Visit Location Provider: Home Office   I discussed the limitations of evaluation and management by telemedicine and the availability of in person appointments. The patient expressed understanding and agreed to proceed.    History of Present Illness: Andrea Martin is a 88 y.o. who identifies as a female who was assigned female at birth, and is being seen today for c/o feeling like she needs to urinate really bad and has a burning sensation.  Daughter is present with patient and helping with history.  Pt states symptoms started this morning.  Pt states she has been able to urinate until now and is having some difficulty urinating.  Pt does not report fever. Daughter states she noticed some confusion a few days ago but last night she was much better. Pt denies N/V.   HPI: HPI  Problems:  Patient Active Problem List   Diagnosis Date Noted   Fall at home 09/01/2020   Multiple rib fractures 09/01/2020   Pneumothorax on right 09/01/2020   History of fractures due to falls 09/01/2020   HTN (hypertension) 09/01/2020   Pneumothorax 09/01/2020   History of infarction of left cerebellum 09/01/2020   Remote history of stroke 12/21/2019   Tremor 08/11/2017   Essential tremor 11/29/2015   PCP NOTES >>>>>>>>>>>>>>>>>>>>>>>> 06/04/2015   Neuropathy 08/04/2013   Annual physical exam 08/01/2012   HLD (hyperlipidemia) 11/03/2011   Anxiety and depression 11/03/2011   Gait disorder 11/03/2011    Allergies:  Allergies  Allergen Reactions  Codeine Nausea And Vomiting   Sulfa Antibiotics Other (See Comments)    Was told allergic to sulfa does not remember why   Penicillins Rash   Medications:  Current Outpatient Medications:    nitrofurantoin, macrocrystal-monohydrate, (MACROBID) 100 MG capsule, Take 1 capsule (100 mg total) by mouth 2 (two) times daily for 5 days., Disp: 10 capsule, Rfl: 0   acetaminophen (TYLENOL) 500 MG tablet, Take 2 tablets (1,000 mg total) by mouth every  6 (six) hours., Disp: 30 tablet, Rfl: 0   alendronate (FOSAMAX) 70 MG tablet, TAKE 1 TABLET BY MOUTH EVERY 7  DAYS. TAKE WITH A FULL GLASS OF  WATER ON AN EMPTY STOMACH., Disp: 12 tablet, Rfl: 3   ALPRAZolam (XANAX) 0.5 MG tablet, TAKE 1/2 TO 1 TABLET BY MOUTH 3  TIMES DAILY AS NEEDED FOR  ANXIETY, Disp: 270 tablet, Rfl: 0   azelastine (ASTELIN) 0.1 % nasal spray, Place 2 sprays into both nostrils at bedtime as needed for rhinitis. Use in each nostril as directed, Disp: 30 mL, Rfl: 3   buPROPion (WELLBUTRIN SR) 150 MG 12 hr tablet, TAKE 2 TABLETS BY MOUTH DAILY, Disp: 180 tablet, Rfl: 3   Calcium Carb-Cholecalciferol (CALCIUM 600+D3) 600-800 MG-UNIT TABS, Take 1 tablet by mouth every morning., Disp: , Rfl:    Cholecalciferol (VITAMIN D3) 50 MCG (2000 UT) TABS, Take 2,000 Units by mouth every morning., Disp: , Rfl:    donepezil (ARICEPT) 5 MG tablet, TAKE 1 TABLET BY MOUTH AT  BEDTIME, Disp: 100 tablet, Rfl: 2   escitalopram (LEXAPRO) 20 MG tablet, TAKE 1 TABLET BY MOUTH DAILY, Disp: 90 tablet, Rfl: 3   GLUCOSAMINE-CHONDROITIN PO, Take 1 tablet by mouth every morning. Glucosamine 1500 mg, chondroitin 1200 mg, Disp: , Rfl:    losartan (COZAAR) 50 MG tablet, TAKE 1 TABLET BY MOUTH DAILY, Disp: 100 tablet, Rfl: 2   meclizine (ANTIVERT) 12.5 MG tablet, Take 1 tablet (12.5 mg total) by mouth 3 (three) times daily as needed for dizziness. (Patient not taking: Reported on 07/14/2022), Disp: 30 tablet, Rfl: 0   Multiple Vitamin (MULTIVITAMIN WITH MINERALS) TABS tablet, Take 1 tablet by mouth every morning., Disp: , Rfl:    Omega-3 Fatty Acids (FISH OIL) 600 MG CAPS, Take 600 mg by mouth every morning., Disp: , Rfl:    pravastatin (PRAVACHOL) 40 MG tablet, TAKE 1 TABLET BY MOUTH AT  BEDTIME, Disp: 100 tablet, Rfl: 2   primidone (MYSOLINE) 50 MG tablet, Take 1 tablet (50 mg total) by mouth at bedtime., Disp: 90 tablet, Rfl: 1  Observations/Objective: Patient is well-developed, well-nourished in no acute  distress.  Resting comfortably at home.  Head is normocephalic, atraumatic.  No labored breathing.  Speech is clear and coherent with logical content.  Patient is alert and oriented at baseline.    Assessment and Plan: 1. UTI symptoms (Primary) - nitrofurantoin, macrocrystal-monohydrate, (MACROBID) 100 MG capsule; Take 1 capsule (100 mg total) by mouth 2 (two) times daily for 5 days.  Dispense: 10 capsule; Refill: 0  -Start Macrobid -Advised Pt and daughter if worsening symptoms to proceed to the urgent care or emergency room  -Pt and daughter verbalized understanding   Follow Up Instructions: I discussed the assessment and treatment plan with the patient. The patient was provided an opportunity to ask questions and all were answered. The patient agreed with the plan and demonstrated an understanding of the instructions.  A copy of instructions were sent to the patient via MyChart unless otherwise noted below.  The patient was advised to call back or seek an in-person evaluation if the symptoms worsen or if the condition fails to improve as anticipated.    Andrea Pandy, PA-C

## 2023-04-18 ENCOUNTER — Encounter: Payer: Self-pay | Admitting: Internal Medicine

## 2023-04-19 MED ORDER — ALPRAZOLAM 0.5 MG PO TABS: 0.5000 mg | ORAL_TABLET | Freq: Four times a day (QID) | ORAL | Status: DC | PRN

## 2023-04-19 NOTE — Telephone Encounter (Signed)
 I called Beth, she reported Ethell does not have fever or chills.  No constipation, no pain. Her main concern is her memory is getting worse and she is getting more anxious.  No actual behavioral issues. We agreed on the following. - Continue Xanax, currently TID, but it would be okay to give her extra 1/2 or 1 tab as needed (does not get excessive somnolence). - Distract and redirect the patient when needed.

## 2023-05-09 ENCOUNTER — Other Ambulatory Visit: Payer: Self-pay | Admitting: Internal Medicine

## 2023-05-10 ENCOUNTER — Encounter: Payer: Self-pay | Admitting: Internal Medicine

## 2023-05-14 ENCOUNTER — Encounter: Payer: Self-pay | Admitting: Nurse Practitioner

## 2023-05-14 ENCOUNTER — Ambulatory Visit (INDEPENDENT_AMBULATORY_CARE_PROVIDER_SITE_OTHER): Payer: Self-pay | Admitting: Nurse Practitioner

## 2023-05-14 VITALS — BP 124/72 | HR 59 | Temp 97.3°F | Ht 64.0 in | Wt 137.0 lb

## 2023-05-14 DIAGNOSIS — Z66 Do not resuscitate: Secondary | ICD-10-CM | POA: Diagnosis not present

## 2023-05-14 DIAGNOSIS — F419 Anxiety disorder, unspecified: Secondary | ICD-10-CM | POA: Diagnosis not present

## 2023-05-14 DIAGNOSIS — G301 Alzheimer's disease with late onset: Secondary | ICD-10-CM | POA: Diagnosis not present

## 2023-05-14 DIAGNOSIS — E782 Mixed hyperlipidemia: Secondary | ICD-10-CM

## 2023-05-14 DIAGNOSIS — M8580 Other specified disorders of bone density and structure, unspecified site: Secondary | ICD-10-CM | POA: Diagnosis not present

## 2023-05-14 DIAGNOSIS — G250 Essential tremor: Secondary | ICD-10-CM | POA: Diagnosis not present

## 2023-05-14 DIAGNOSIS — F02B3 Dementia in other diseases classified elsewhere, moderate, with mood disturbance: Secondary | ICD-10-CM

## 2023-05-14 DIAGNOSIS — I10 Essential (primary) hypertension: Secondary | ICD-10-CM | POA: Diagnosis not present

## 2023-05-14 DIAGNOSIS — F32A Depression, unspecified: Secondary | ICD-10-CM

## 2023-05-14 DIAGNOSIS — R7989 Other specified abnormal findings of blood chemistry: Secondary | ICD-10-CM | POA: Diagnosis not present

## 2023-05-14 MED ORDER — MEMANTINE HCL 28 X 5 MG & 21 X 10 MG PO TABS: ORAL_TABLET | ORAL | 12 refills | Status: DC

## 2023-05-14 NOTE — Patient Instructions (Addendum)
 Decrease xanax to 1 tablet in the AM then half in the afternoon and half tablet in evening.   Recommended to take calcium 600 mg twice daily with Vitamin D 2000 units daily and weight bearing activity 30 mins/5 days a week   Look into care giver for her during the day.

## 2023-05-14 NOTE — Progress Notes (Signed)
 Careteam: Patient Care Team: Sharon Seller, NP as PCP - General (Geriatric Medicine) Ranee Gosselin, MD as Consulting Physician (Orthopedic Surgery) Vida Rigger, MD as Consulting Physician (Gastroenterology)  PLACE OF SERVICE:  [facility]  Advanced Directive information Does Patient Have a Medical Advance Directive?: Yes, Type of Advance Directive: Healthcare Power of [place];Living will, Does patient want to make changes to medical advance directive?: No - Patient declined  Allergies  Allergen Reactions   Codeine Nausea And Vomiting   Sulfa Antibiotics Other (See Comments)    Was told allergic to sulfa does not remember why   Penicillins Rash    Chief Complaint  Patient presents with   Establish Care    New patient to establish care. Discuss dementia. Pill bottles not present, however patient previously seen in [facility] system      Discussed the use of AI scribe software for clinical note transcription with the patient, who gave verbal consent to proceed.  History of Present Illness   Andrea Martin is an 88 year old female with dementia who presents for an established care appointment. She is accompanied by her daughter, who is her primary caregiver. She was previously seen by Dr. Drue Novel.  She has been experiencing progressive worsening of dementia, characterized by agitation, especially when alone, and episodes of confusion, such as not recognizing her own home and believing strangers are present. She has been on alprazolam, taking three tablets a day, to help with agitation. Her daughter manages her medications. She is also on Aricept for memory preservation. Her mother had a history of depression and dementia, and her husband, who passed away in 06/06/21, had Alzheimer's.  She is on Wellbutrin and Lexapro for depression and anxiety. Her daughter notes that her mood and memory seem to improve when she has not taken Xanax for a while, but she becomes agitated without it.  There is a family history of depression, as her mother also had depression.  She has a history of osteopenia, diagnosed in June 07, 2018, and was prescribed Fosamax, which she has not been taking consistently. She has not been taking calcium and vitamin D supplements regularly, despite a history of low bone mass. Her daughter reports that she has loved milk all her life.  She has a history of high blood pressure and is currently on losartan for management.   She has a history of high cholesterol and is on pravastatin for management. There was a recent increase in dosage from 20 mg to 40 mg. Her cholesterol levels are planned to be rechecked.  She has been using Tylenol for headaches and joint pain.  Occasionally uses Astelin nasal spray for a runny nose. S  he has experienced dizziness and has been using Bonine as needed.   Her weight has been stable. Her thyroid function was slightly off in the past, but she is not on thyroid medication.      Review of Systems:  Review of Systems  Constitutional:  Negative for chills, fever and weight loss.  HENT:  Negative for tinnitus.   Respiratory:  Negative for cough, sputum production and shortness of breath.   Cardiovascular:  Negative for chest pain, palpitations and leg swelling.  Gastrointestinal:  Negative for abdominal pain, constipation, diarrhea and heartburn.  Genitourinary:  Negative for dysuria, frequency and urgency.  Musculoskeletal:  Negative for back pain, falls, joint pain and myalgias.  Skin: Negative.   Neurological:  Negative for dizziness and headaches.  Psychiatric/Behavioral:  Positive for depression  and memory loss. The patient is nervous/anxious. The patient does not have insomnia.     Past Medical History:  Diagnosis Date   Anxiety and depression    Gait disorder    History of fractures due to falls 09/01/2020   HTN (hypertension) 09/01/2020   Hyperlipidemia    Neuropathy 08/04/2013   Shoulder dislocation    h/o after a  fall   Past Surgical History:  Procedure Laterality Date   NO PAST SURGERIES     Social History:   reports that she has never smoked. She has never used smokeless tobacco. She reports that she does not drink alcohol and does not use drugs.  Family History  Problem Relation Age of Onset   Heart disease Mother    Dementia Mother    Heart disease Father        CAD?   Coronary artery disease Brother 60   Breast cancer Neg Hx    Colon cancer Neg Hx    Diabetes Neg Hx    Stroke Neg Hx     Medications: Patient's Medications  New Prescriptions   MEMANTINE (NAMENDA TITRATION PAK) TABLET PACK    5 mg/day for =1 week; 5 mg twice daily for =1 week; 15 mg/day given in 5 mg and 10 mg separated doses for =1 week; then 10 mg twice daily  Previous Medications   ACETAMINOPHEN (TYLENOL) 500 MG TABLET    Take 2 tablets (1,000 mg total) by mouth every 6 (six) hours.   ALPRAZOLAM (XANAX) 0.5 MG TABLET    Take 1 tablet (0.5 mg total) by mouth 4 (four) times daily as needed for anxiety.   AZELASTINE (ASTELIN) 0.1 % NASAL SPRAY    Place 2 sprays into both nostrils at bedtime as needed for rhinitis. Use in each nostril as directed   BUPROPION (WELLBUTRIN SR) 150 MG 12 HR TABLET    TAKE 2 TABLETS BY MOUTH DAILY   CALCIUM CARB-CHOLECALCIFEROL (CALCIUM 600+D3) 600-800 MG-UNIT TABS    Take 1 tablet by mouth every morning.   CHOLECALCIFEROL (VITAMIN D3) 50 MCG (2000 UT) TABS    Take 2,000 Units by mouth every morning.   DONEPEZIL (ARICEPT) 5 MG TABLET    TAKE 1 TABLET BY MOUTH AT  BEDTIME   ESCITALOPRAM (LEXAPRO) 20 MG TABLET    TAKE 1 TABLET BY MOUTH DAILY   LOSARTAN (COZAAR) 50 MG TABLET    TAKE 1 TABLET BY MOUTH DAILY   MULTIPLE VITAMIN (MULTIVITAMIN WITH MINERALS) TABS TABLET    Take 1 tablet by mouth every morning.   PRAVASTATIN (PRAVACHOL) 40 MG TABLET    TAKE 1 TABLET BY MOUTH AT  BEDTIME   PRIMIDONE (MYSOLINE) 50 MG TABLET    Take 1 tablet (50 mg total) by mouth at bedtime.  Modified Medications    No medications on file  Discontinued Medications   ALENDRONATE (FOSAMAX) 70 MG TABLET    TAKE 1 TABLET BY MOUTH EVERY 7  DAYS. TAKE WITH A FULL GLASS OF  WATER ON AN EMPTY STOMACH.   GLUCOSAMINE-CHONDROITIN PO    Take 1 tablet by mouth every morning. Glucosamine 1500 mg, chondroitin 1200 mg   MECLIZINE (ANTIVERT) 12.5 MG TABLET    Take 1 tablet (12.5 mg total) by mouth 3 (three) times daily as needed for dizziness.   OMEGA-3 FATTY ACIDS (FISH OIL) 600 MG CAPS    Take 600 mg by mouth every morning.   PROBIOTIC PRODUCT (ALIGN) 10 MG CAPS    Take 1  capsule by mouth daily.    Physical Exam:  Vitals:   05/14/23 1410  BP: 124/72  Pulse: (!) 59  Temp: (!) 97.3 F (36.3 C)  TempSrc: Temporal  SpO2: 96%  Weight: 137 lb (62.1 kg)  Height: 5\' 4"  (1.626 m)   Body mass index is 23.52 kg/m. Wt Readings from Last 3 Encounters:  05/14/23 137 lb (62.1 kg)  01/15/23 141 lb 4 oz (64.1 kg)  07/14/22 135 lb 8 oz (61.5 kg)    Physical Exam Constitutional:      General: She is not in acute distress.    Appearance: She is well-developed. She is not diaphoretic.  HENT:     Head: Normocephalic and atraumatic.     Mouth/Throat:     Pharynx: No oropharyngeal exudate.  Eyes:     Conjunctiva/sclera: Conjunctivae normal.     Pupils: Pupils are equal, round, and reactive to light.  Cardiovascular:     Rate and Rhythm: Normal rate and regular rhythm.     Heart sounds: Normal heart sounds.  Pulmonary:     Effort: Pulmonary effort is normal.     Breath sounds: Normal breath sounds.  Abdominal:     General: Bowel sounds are normal.     Palpations: Abdomen is soft.  Musculoskeletal:     Cervical back: Normal range of motion and neck supple.     Right lower leg: No edema.     Left lower leg: No edema.  Skin:    General: Skin is warm and dry.  Neurological:     Mental Status: She is alert.  Psychiatric:        Mood and Affect: Mood normal.     Labs reviewed: Basic Metabolic Panel: Recent  Labs    07/14/22 1554 01/15/23 1625  NA 141 140  K 4.1 4.2  CL 107 104  CO2 29 29  GLUCOSE 84 85  BUN 17 20  CREATININE 1.06 1.00*  CALCIUM 9.4 9.5  TSH  --  5.68*   Liver Function Tests: Recent Labs    01/15/23 1625  AST 16  ALT 13   No results for input(s): "LIPASE", "AMYLASE" in the last 8760 hours. No results for input(s): "AMMONIA" in the last 8760 hours. CBC: Recent Labs    07/14/22 1554  WBC 4.9  NEUTROABS 2.5  HGB 12.4  HCT 37.4  MCV 95.9  PLT 247.0   Lipid Panel: Recent Labs    01/15/23 1625  CHOL 199  HDL 53  LDLCALC 121*  TRIG 133  CHOLHDL 3.8   TSH: Recent Labs    01/15/23 1625  TSH 5.68*   A1C: Lab Results  Component Value Date   HGBA1C 5.3 04/22/2021     Assessment/Plan Assessment and Plan    Dementia Dementia with anxiety and agitation Discussed risks of long-term alprazolam use with agitation however may be unavoidable will attempt GDR - Reduce alprazolam to 1 tablet in the morning and 0.5 tablet in the afternoon and evening. - Prescribe Namenda titration pack. - Consider sitter or companion for safety and engagement. - Discuss potential use of cameras or tracking devices for safety.  Anxiety and Depression Managed with Wellbutrin, Lexapro, and alprazolam. GDR of alprazolam to 1 tablet in the am and 0.5 tablet in the afternoon and evening.  - Reassess Wellbutrin dosage at next visit.  Tremor Tremor managed with primidone, worsens with agitation.  Osteopenia Osteopenia diagnosed in 2020. Not taking Fosamax, calcium, or vitamin D regularly. Emphasized importance of  supplementation. - Recommend calcium 600 mg twice daily. - Recommend vitamin D daily. - Consider bone density scan in the future.  Hypertension Hypertension managed with losartan 50 mg daily.  Hyperlipidemia Managed with pravastatin, recently increased to 40 mg. Suspected non-compliance may have contributed to elevated cholesterol. - Recheck cholesterol  levels.  Goals of Care/DNR Discussed advance directives, including DNR order. She does not wish to be resuscitated or placed on life support. - Sign DNR order and place on file.  Elevated TSH Recheck TSH atoday  Follow-up Plan to follow up in one month to assess response to medication changes and overall progress. - Schedule follow-up appointment in one month.       Return in about 4 weeks (around 06/11/2023) for routine follow up.:  Andrea Martin K. Biagio Borg [facility] [phone]

## 2023-05-15 LAB — COMPLETE METABOLIC PANEL WITH GFR
AG Ratio: 1.3 (calc) (ref 1.0–2.5)
ALT: 13 U/L (ref 6–29)
AST: 23 U/L (ref 10–35)
Albumin: 3.3 g/dL — ABNORMAL LOW (ref 3.6–5.1)
Alkaline phosphatase (APISO): 65 U/L (ref 37–153)
BUN: 11 mg/dL (ref 7–25)
CO2: 33 mmol/L — ABNORMAL HIGH (ref 20–32)
Calcium: 8.7 mg/dL (ref 8.6–10.4)
Chloride: 100 mmol/L (ref 98–110)
Creat: 0.83 mg/dL (ref 0.60–0.95)
Globulin: 2.6 g/dL (ref 1.9–3.7)
Glucose, Bld: 101 mg/dL — ABNORMAL HIGH (ref 65–99)
Potassium: 2.7 mmol/L — CL (ref 3.5–5.3)
Sodium: 142 mmol/L (ref 135–146)
Total Bilirubin: 0.5 mg/dL (ref 0.2–1.2)
Total Protein: 5.9 g/dL — ABNORMAL LOW (ref 6.1–8.1)

## 2023-05-15 LAB — CBC WITH DIFFERENTIAL/PLATELET
Absolute Lymphocytes: 1940 {cells}/uL (ref 850–3900)
Absolute Monocytes: 436 {cells}/uL (ref 200–950)
Basophils Absolute: 20 {cells}/uL (ref 0–200)
Basophils Relative: 0.4 %
Eosinophils Absolute: 78 {cells}/uL (ref 15–500)
Eosinophils Relative: 1.6 %
HCT: 39.7 % (ref 35.0–45.0)
Hemoglobin: 13.3 g/dL (ref 11.7–15.5)
MCH: 31.4 pg (ref 27.0–33.0)
MCHC: 33.5 g/dL (ref 32.0–36.0)
MCV: 93.6 fL (ref 80.0–100.0)
MPV: 10.9 fL (ref 7.5–12.5)
Monocytes Relative: 8.9 %
Neutro Abs: 2426 {cells}/uL (ref 1500–7800)
Neutrophils Relative %: 49.5 %
Platelets: 269 10*3/uL (ref 140–400)
RBC: 4.24 10*6/uL (ref 3.80–5.10)
RDW: 12.4 % (ref 11.0–15.0)
Total Lymphocyte: 39.6 %
WBC: 4.9 10*3/uL (ref 3.8–10.8)

## 2023-05-15 LAB — LIPID PANEL
Cholesterol: 168 mg/dL (ref <200)
HDL: 52 mg/dL (ref >=50)
LDL Cholesterol (Calc): 92 mg/dL
Non-HDL Cholesterol (Calc): 116 mg/dL (ref <130)
Total CHOL/HDL Ratio: 3.2 (calc) (ref <5.0)
Triglycerides: 137 mg/dL (ref <150)

## 2023-05-15 LAB — TSH: TSH: 3.05 m[IU]/L (ref 0.40–4.50)

## 2023-05-17 ENCOUNTER — Ambulatory Visit: Payer: Medicare Other | Admitting: Internal Medicine

## 2023-05-17 ENCOUNTER — Telehealth: Payer: Self-pay

## 2023-05-17 ENCOUNTER — Encounter: Payer: Self-pay | Admitting: Nurse Practitioner

## 2023-05-17 ENCOUNTER — Other Ambulatory Visit: Payer: Self-pay | Admitting: Nurse Practitioner

## 2023-05-17 ENCOUNTER — Other Ambulatory Visit: Payer: Self-pay

## 2023-05-17 DIAGNOSIS — E876 Hypokalemia: Secondary | ICD-10-CM

## 2023-05-17 MED ORDER — POTASSIUM CHLORIDE CRYS ER 20 MEQ PO TBCR: EXTENDED_RELEASE_TABLET | ORAL | 0 refills | Status: DC

## 2023-05-17 NOTE — Telephone Encounter (Signed)
Message routed to PCP Eubanks, Jessica K, NP  

## 2023-05-17 NOTE — Telephone Encounter (Signed)
-----   Message from Sharon Seller sent at 05/17/2023  8:05 AM EDT ----- Potassium is very very low. Recommend her getting replacement in today Will send Rx to pharmacy for 40 meq this morning and then to take 40 meq again this afternoon Would recommend her taking potassium 40 meq daily for 2 days thereafter and to recheck lab in office on Friday AM  Protein level on the low side, make sure she is eating 3 nutritious meals a day Thyroid and cholesterol levels at goal.

## 2023-05-17 NOTE — Telephone Encounter (Signed)
 Patient notfied and agreed order placed for labs,patient scheduled for Friday.Marland Kitchen

## 2023-05-21 ENCOUNTER — Other Ambulatory Visit

## 2023-05-21 DIAGNOSIS — E876 Hypokalemia: Secondary | ICD-10-CM | POA: Diagnosis not present

## 2023-05-22 LAB — COMPLETE METABOLIC PANEL WITHOUT GFR
AG Ratio: 1.3 (calc) (ref 1.0–2.5)
ALT: 9 U/L (ref 6–29)
AST: 15 U/L (ref 10–35)
Albumin: 3.1 g/dL — ABNORMAL LOW (ref 3.6–5.1)
Alkaline phosphatase (APISO): 62 U/L (ref 37–153)
BUN: 10 mg/dL (ref 7–25)
CO2: 32 mmol/L (ref 20–32)
Calcium: 8.8 mg/dL (ref 8.6–10.4)
Chloride: 102 mmol/L (ref 98–110)
Creat: 0.89 mg/dL (ref 0.60–0.95)
Globulin: 2.4 g/dL (ref 1.9–3.7)
Glucose, Bld: 97 mg/dL (ref 65–99)
Potassium: 3.8 mmol/L (ref 3.5–5.3)
Sodium: 139 mmol/L (ref 135–146)
Total Bilirubin: 0.4 mg/dL (ref 0.2–1.2)
Total Protein: 5.5 g/dL — ABNORMAL LOW (ref 6.1–8.1)

## 2023-05-24 ENCOUNTER — Encounter: Payer: Self-pay | Admitting: Nurse Practitioner

## 2023-05-24 NOTE — Telephone Encounter (Signed)
Message routed to PCP Eubanks, Jessica K, NP  

## 2023-06-06 ENCOUNTER — Encounter: Payer: Self-pay | Admitting: Nurse Practitioner

## 2023-06-06 DIAGNOSIS — G301 Alzheimer's disease with late onset: Secondary | ICD-10-CM

## 2023-06-07 MED ORDER — MEMANTINE HCL 10 MG PO TABS: 10.0000 mg | ORAL_TABLET | Freq: Two times a day (BID) | ORAL | 3 refills | Status: DC

## 2023-06-07 NOTE — Telephone Encounter (Signed)
Message routed to PCP Eubanks, Jessica K, NP  

## 2023-06-14 ENCOUNTER — Ambulatory Visit (INDEPENDENT_AMBULATORY_CARE_PROVIDER_SITE_OTHER): Admitting: Nurse Practitioner

## 2023-06-14 ENCOUNTER — Encounter: Payer: Self-pay | Admitting: Nurse Practitioner

## 2023-06-14 VITALS — BP 118/70 | HR 57 | Temp 97.1°F | Ht 64.0 in | Wt 129.0 lb

## 2023-06-14 DIAGNOSIS — I10 Essential (primary) hypertension: Secondary | ICD-10-CM

## 2023-06-14 DIAGNOSIS — G250 Essential tremor: Secondary | ICD-10-CM | POA: Diagnosis not present

## 2023-06-14 DIAGNOSIS — G301 Alzheimer's disease with late onset: Secondary | ICD-10-CM | POA: Diagnosis not present

## 2023-06-14 DIAGNOSIS — R269 Unspecified abnormalities of gait and mobility: Secondary | ICD-10-CM | POA: Diagnosis not present

## 2023-06-14 DIAGNOSIS — F419 Anxiety disorder, unspecified: Secondary | ICD-10-CM

## 2023-06-14 DIAGNOSIS — R634 Abnormal weight loss: Secondary | ICD-10-CM

## 2023-06-14 DIAGNOSIS — E876 Hypokalemia: Secondary | ICD-10-CM | POA: Diagnosis not present

## 2023-06-14 DIAGNOSIS — M8580 Other specified disorders of bone density and structure, unspecified site: Secondary | ICD-10-CM | POA: Insufficient documentation

## 2023-06-14 DIAGNOSIS — F02B3 Dementia in other diseases classified elsewhere, moderate, with mood disturbance: Secondary | ICD-10-CM | POA: Insufficient documentation

## 2023-06-14 DIAGNOSIS — F32A Depression, unspecified: Secondary | ICD-10-CM

## 2023-06-14 MED ORDER — ALPRAZOLAM 0.5 MG PO TABS: 0.2500 mg | ORAL_TABLET | Freq: Two times a day (BID) | ORAL | Status: DC | PRN

## 2023-06-14 NOTE — Assessment & Plan Note (Signed)
 Low on last labs, will follow up today to make sure remains stable.

## 2023-06-14 NOTE — Assessment & Plan Note (Signed)
 Mood has been stable over the last month, she has titrated to half tablet twice daily on xanax  and will continue to wean. Feels much better after cutting down xanax  and reports less anxiety. For her to continue to titrate over the next 2 week and to stop routine use.

## 2023-06-14 NOTE — Assessment & Plan Note (Signed)
 Well controlled on primidone  50 mg at bedtime.

## 2023-06-14 NOTE — Progress Notes (Signed)
 Careteam: Patient Care Team: Verma Gobble, NP as PCP - General (Geriatric Medicine) Hazle Lites, MD as Consulting Physician (Orthopedic Surgery) Ozell Blunt, MD as Consulting Physician (Gastroenterology)  PLACE OF SERVICE:  [facility]  Advanced Directive information    Allergies  Allergen Reactions   Codeine Nausea And Vomiting   Sulfa Antibiotics Other (See Comments)    Was told allergic to sulfa does not remember why   Penicillins Rash    Chief Complaint  Patient presents with   Medical Management of Chronic Issues    Routine visit. Patient is taking Xanax  differently than listed 1/2 tablet twice daily. Discuss calcium dosage and instructions. Here with daughter     HPI:  Discussed the use of AI scribe software for clinical note transcription with the patient, who gave verbal consent to proceed.  History of Present Illness   Andrea Martin is an 88 year old female who presents for a four-week follow-up regarding medication adjustments for anxiety and memory issues.  She has experienced significant improvement in her condition after starting Namenda  and reducing alprazolam . Previously, she was taking alprazolam  one tablet three times a day, which has been reduced to 0.25 mg in the morning and 0.25 mg at night. She reports no anxiety and feels wonderful with this regimen.  She has completed the Namenda  titration and is now taking two 10 mg tablets in the evening. She is also on Aricept  (donepezil ) 5 mg at bedtime, with no increase in dose due to concerns about heart rate. No dizziness or falls are reported, and her anxiety is well-managed. She has not had a fall in over a month which before she was having frequently.   Her caregiver notes increased anxiety with higher doses of alprazolam . She is also taking Lexapro  for anxiety and Wellbutrin  in the morning, two tablets daily. Her caregiver encourages her to eat more due to weight loss, now at 129 pounds. She is  remembering to eat more regularly, though she previously forgot or was not hungry.  She is on calcium 600 mg twice a day and vitamin D  once a day for osteopenia.   Her tremors have improved with primidone  taken once at bedtime.   Her potassium, previously low, has normalized after supplementation. No constipation or diarrhea  Dizziness has significantly improved since cutting back on xanax .     Review of Systems:  Review of Systems  Constitutional:  Negative for chills, fever and weight loss.  HENT:  Negative for tinnitus.   Respiratory:  Negative for cough, sputum production and shortness of breath.   Cardiovascular:  Negative for chest pain, palpitations and leg swelling.  Gastrointestinal:  Negative for abdominal pain, constipation, diarrhea and heartburn.  Genitourinary:  Negative for dysuria, frequency and urgency.  Musculoskeletal:  Negative for back pain, falls, joint pain and myalgias.  Skin: Negative.   Neurological:  Negative for dizziness and headaches.  Psychiatric/Behavioral:  Positive for memory loss. Negative for depression. The patient is not nervous/anxious and does not have insomnia.     Past Medical History:  Diagnosis Date   Anxiety and depression    Gait disorder    History of fractures due to falls 09/01/2020   HTN (hypertension) 09/01/2020   Hyperlipidemia    Neuropathy 08/04/2013   Shoulder dislocation    h/o after a fall   Past Surgical History:  Procedure Laterality Date   NO PAST SURGERIES     Social History:   reports that she has never smoked. She  has never used smokeless tobacco. She reports that she does not drink alcohol and does not use drugs.  Family History  Problem Relation Age of Onset   Heart disease Mother    Dementia Mother    Heart disease Father        CAD?   Coronary artery disease Brother 29   Breast cancer Neg Hx    Colon cancer Neg Hx    Diabetes Neg Hx    Stroke Neg Hx     Medications: Patient's Medications  New  Prescriptions   No medications on file  Previous Medications   ACETAMINOPHEN  (TYLENOL ) 500 MG TABLET    Take 2 tablets (1,000 mg total) by mouth every 6 (six) hours.   ALPRAZOLAM  (XANAX ) 0.5 MG TABLET    Take 1 tablet (0.5 mg total) by mouth 4 (four) times daily as needed for anxiety.   AZELASTINE  (ASTELIN ) 0.1 % NASAL SPRAY    Place 2 sprays into both nostrils at bedtime as needed for rhinitis. Use in each nostril as directed   BUPROPION  (WELLBUTRIN  SR) 150 MG 12 HR TABLET    TAKE 2 TABLETS BY MOUTH DAILY   CALCIUM CARB-CHOLECALCIFEROL (CALCIUM 600+D3) 600-800 MG-UNIT TABS    Take 1 tablet by mouth every morning.   CHOLECALCIFEROL (VITAMIN D3) 50 MCG (2000 UT) TABS    Take 2,000 Units by mouth every morning.   DONEPEZIL  (ARICEPT ) 5 MG TABLET    TAKE 1 TABLET BY MOUTH AT  BEDTIME   ESCITALOPRAM  (LEXAPRO ) 20 MG TABLET    TAKE 1 TABLET BY MOUTH DAILY   LOSARTAN  (COZAAR ) 50 MG TABLET    TAKE 1 TABLET BY MOUTH DAILY   MEMANTINE  (NAMENDA ) 10 MG TABLET    Take 1 tablet (10 mg total) by mouth 2 (two) times daily.   MULTIPLE VITAMIN (MULTIVITAMIN WITH MINERALS) TABS TABLET    Take 1 tablet by mouth every morning.   POTASSIUM CHLORIDE  SA (KLOR-CON  M) 20 MEQ TABLET    To take 40 meq this morning and again in the evening, followed by 40 meq daily for 2 days   PRAVASTATIN  (PRAVACHOL ) 40 MG TABLET    TAKE 1 TABLET BY MOUTH AT  BEDTIME   PRIMIDONE  (MYSOLINE ) 50 MG TABLET    Take 1 tablet (50 mg total) by mouth at bedtime.  Modified Medications   No medications on file  Discontinued Medications   No medications on file    Physical Exam:  Vitals:   06/14/23 1304  BP: 118/70  Pulse: (!) 57  Temp: (!) 97.1 F (36.2 C)  SpO2: 99%  Weight: 129 lb (58.5 kg)  Height: 5\' 4"  (1.626 m)   Body mass index is 22.14 kg/m. Wt Readings from Last 3 Encounters:  06/14/23 129 lb (58.5 kg)  05/14/23 137 lb (62.1 kg)  01/15/23 141 lb 4 oz (64.1 kg)    Physical Exam Constitutional:      General: She is not  in acute distress.    Appearance: She is well-developed. She is not diaphoretic.  HENT:     Head: Normocephalic and atraumatic.     Mouth/Throat:     Pharynx: No oropharyngeal exudate.  Eyes:     Conjunctiva/sclera: Conjunctivae normal.     Pupils: Pupils are equal, round, and reactive to light.  Cardiovascular:     Rate and Rhythm: Normal rate and regular rhythm.     Heart sounds: Normal heart sounds.  Pulmonary:     Effort: Pulmonary effort is normal.  Breath sounds: Normal breath sounds.  Abdominal:     General: Bowel sounds are normal.     Palpations: Abdomen is soft.  Musculoskeletal:     Cervical back: Normal range of motion and neck supple.     Right lower leg: No edema.     Left lower leg: No edema.  Skin:    General: Skin is warm and dry.  Neurological:     Mental Status: She is alert.  Psychiatric:        Mood and Affect: Mood normal.     Labs reviewed: Basic Metabolic Panel: Recent Labs    01/15/23 1625 05/14/23 1518 05/21/23 1138  NA 140 142 139  K 4.2 2.7* 3.8  CL 104 100 102  CO2 29 33* 32  GLUCOSE 85 101* 97  BUN 20 11 10   CREATININE 1.00* 0.83 0.89  CALCIUM 9.5 8.7 8.8  TSH 5.68* 3.05  --    Liver Function Tests: Recent Labs    01/15/23 1625 05/14/23 1518 05/21/23 1138  AST 16 23 15   ALT 13 13 9   BILITOT  --  0.5 0.4  PROT  --  5.9* 5.5*   No results for input(s): "LIPASE", "AMYLASE" in the last 8760 hours. No results for input(s): "AMMONIA" in the last 8760 hours. 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 Doing well on Aricept  5 mg daily and Namenda  10 mg twice daily.    Anxiety and depression Assessment & Plan: Mood has been stable over the last month, she has titrated to half tablet twice daily on xanax  and will continue to wean. Feels much better after cutting down xanax  and reports less anxiety. For her to continue to titrate over the next 2 week and to stop routine use.    Osteopenia, unspecified location Assessment & Plan: Recommended to continue to take calcium 600 mg twice daily with Vitamin D  2000 units daily and weight bearing activity 30 mins/5 days a week   Essential tremor Assessment & Plan: Well controlled on primidone  50 mg at bedtime.    Hypokalemia Assessment & Plan: Low on last labs, will follow up today to make sure remains stable.   Orders: -     Basic Metabolic Panel Without GFR  Gait disorder Assessment & Plan: Ongoing and stable uses walker for support.    Primary hypertension Assessment & Plan: Blood pressure well controlled, goal bp <140/90 Continue current medications and dietary modifications follow metabolic panel   Other orders -     ALPRAZolam ; Take 0.5 tablets (0.25 mg total) by mouth 2 (two) times daily as needed for anxiety.   Weight loss Noted today, however daughter reports she is eating better. encouraged to liberalize diet. To have protein supplement in addition to smallest meal of the day.   Return  in about 4 months (around 10/14/2023) for routine follow up, labs at time of visit.  Kisa Fujii K. Denney Fisherman [facility] [phone]

## 2023-06-14 NOTE — Assessment & Plan Note (Signed)
 Recommended to continue to take calcium 600 mg twice daily with Vitamin D  2000 units daily and weight bearing activity 30 mins/5 days a week

## 2023-06-14 NOTE — Assessment & Plan Note (Signed)
 Stable, no acute changes in cognitive or functional status, continue supportive care. Doing well on Aricept  5 mg daily and Namenda  10 mg twice daily.

## 2023-06-14 NOTE — Assessment & Plan Note (Signed)
 Blood pressure well controlled, goal bp <140/90 Continue current medications and dietary modifications follow metabolic panel

## 2023-06-14 NOTE — Assessment & Plan Note (Signed)
 Ongoing and stable uses walker for support.

## 2023-06-15 ENCOUNTER — Encounter: Payer: Self-pay | Admitting: Nurse Practitioner

## 2023-06-15 LAB — BASIC METABOLIC PANEL WITHOUT GFR
BUN/Creatinine Ratio: 8 (calc) (ref 6–22)
BUN: 9 mg/dL (ref 7–25)
CO2: 30 mmol/L (ref 20–32)
Calcium: 9.6 mg/dL (ref 8.6–10.4)
Chloride: 104 mmol/L (ref 98–110)
Creat: 1.06 mg/dL — ABNORMAL HIGH (ref 0.60–0.95)
Glucose, Bld: 117 mg/dL (ref 65–139)
Potassium: 3.7 mmol/L (ref 3.5–5.3)
Sodium: 142 mmol/L (ref 135–146)

## 2023-08-10 ENCOUNTER — Encounter: Payer: Self-pay | Admitting: Nurse Practitioner

## 2023-08-29 ENCOUNTER — Other Ambulatory Visit: Payer: Self-pay | Admitting: Nurse Practitioner

## 2023-08-29 DIAGNOSIS — G301 Alzheimer's disease with late onset: Secondary | ICD-10-CM

## 2023-09-22 ENCOUNTER — Other Ambulatory Visit: Payer: Self-pay | Admitting: Internal Medicine

## 2023-10-09 ENCOUNTER — Other Ambulatory Visit: Payer: Self-pay | Admitting: Nurse Practitioner

## 2023-10-09 DIAGNOSIS — F02B3 Dementia in other diseases classified elsewhere, moderate, with mood disturbance: Secondary | ICD-10-CM

## 2023-10-13 ENCOUNTER — Other Ambulatory Visit: Payer: Self-pay | Admitting: Internal Medicine

## 2023-10-13 ENCOUNTER — Other Ambulatory Visit: Payer: Self-pay

## 2023-10-13 NOTE — Telephone Encounter (Signed)
 High Risk Warning Populated when attempting to refill, I will send to Provider for further review

## 2023-10-14 MED ORDER — BUPROPION HCL ER (SR) 150 MG PO TB12: 300.0000 mg | ORAL_TABLET | Freq: Every day | ORAL | 1 refills | Status: DC

## 2023-10-15 ENCOUNTER — Ambulatory Visit (INDEPENDENT_AMBULATORY_CARE_PROVIDER_SITE_OTHER): Admitting: Nurse Practitioner

## 2023-10-15 VITALS — BP 124/78 | HR 82 | Temp 97.2°F | Ht 64.0 in | Wt 122.6 lb

## 2023-10-15 DIAGNOSIS — F419 Anxiety disorder, unspecified: Secondary | ICD-10-CM | POA: Diagnosis not present

## 2023-10-15 DIAGNOSIS — F02B3 Dementia in other diseases classified elsewhere, moderate, with mood disturbance: Secondary | ICD-10-CM

## 2023-10-15 DIAGNOSIS — G301 Alzheimer's disease with late onset: Secondary | ICD-10-CM | POA: Diagnosis not present

## 2023-10-15 DIAGNOSIS — G250 Essential tremor: Secondary | ICD-10-CM

## 2023-10-15 DIAGNOSIS — F32A Depression, unspecified: Secondary | ICD-10-CM

## 2023-10-15 DIAGNOSIS — R634 Abnormal weight loss: Secondary | ICD-10-CM | POA: Diagnosis not present

## 2023-10-15 DIAGNOSIS — J300 Vasomotor rhinitis: Secondary | ICD-10-CM | POA: Diagnosis not present

## 2023-10-15 DIAGNOSIS — G4700 Insomnia, unspecified: Secondary | ICD-10-CM

## 2023-10-15 MED ORDER — MIRTAZAPINE 7.5 MG PO TABS: 7.5000 mg | ORAL_TABLET | Freq: Every day | ORAL | 1 refills | Status: DC

## 2023-10-15 MED ORDER — AZELASTINE HCL 0.1 % NA SOLN: 2.0000 | Freq: Every evening | NASAL | 3 refills | Status: AC | PRN

## 2023-10-15 NOTE — Progress Notes (Signed)
 Careteam: Patient Care Team: Caro Harlene POUR, NP as PCP - General (Geriatric Medicine) Heide Ingle, MD as Consulting Physician (Orthopedic Surgery) Rosalie Kitchens, MD as Consulting Physician (Gastroenterology)  PLACE OF SERVICE:  [facility]  Advanced Directive information    Allergies  Allergen Reactions   Codeine Nausea And Vomiting   Sulfa Antibiotics Other (See Comments)    Was told allergic to sulfa does not remember why   Penicillins Rash    Chief Complaint  Patient presents with   Follow-up    4 month follow up. Patient has  concerns about nose drip, Patient has concerns about sleep pattern not getting enough sleep.    HPI:  Discussed the use of AI scribe software for clinical note transcription with the patient, who gave verbal consent to proceed.  History of Present Illness Andrea Martin is an 88 year old female who is here for 6 month follow up.   Today she reports concerns of nasal dripping and sleep disturbances.  She experiences a persistent nasal drip, described as a sensation of 'dripping from my head into my throat.' This symptom is constant and has been likened to having an infection in her head, although it does not feel like one. She occasionally coughs up phlegm associated with this condition. She has been using an over-the-counter nasal spray but is unsure about the use of her prescribed Astelin  nasal spray.  She has dementia and lives with daughter, she has been experiencing sleep disturbances, characterized by alternating periods of good sleep for two to three days followed by nights of poor sleep, leading to increased agitation and irritability. Her daughter notes that she has tried melatonin, which seemed to increase her confusion.  She has been losing weight due to a lack of appetite, often forgetting to eat or not feeling hungry. Her history includes elevated TSH, for which she is not currently on medication, tremors managed with primidone ,  and cholesterol management with pravastatin . She is also on Lexapro  and Wellbutrin  for mood management.    Review of Systems:  Review of Systems  Constitutional:  Positive for weight loss. Negative for chills and fever.  HENT:  Negative for tinnitus.        Rhinorrhea   Respiratory:  Negative for cough, sputum production and shortness of breath.   Cardiovascular:  Negative for chest pain, palpitations and leg swelling.  Gastrointestinal:  Negative for abdominal pain, constipation, diarrhea and heartburn.  Genitourinary:  Negative for dysuria, frequency and urgency.  Musculoskeletal:  Negative for back pain, falls, joint pain and myalgias.  Skin: Negative.   Neurological:  Negative for dizziness and headaches.  Psychiatric/Behavioral:  Positive for memory loss. Negative for depression. The patient does not have insomnia.     Past Medical History:  Diagnosis Date   Anxiety and depression    Gait disorder    History of fractures due to falls 09/01/2020   History of fractures due to falls 09/01/2020   HTN (hypertension) 09/01/2020   Hyperlipidemia    Neuropathy 08/04/2013   Shoulder dislocation    h/o after a fall   Past Surgical History:  Procedure Laterality Date   NO PAST SURGERIES     Social History:   reports that she has never smoked. She has never used smokeless tobacco. She reports that she does not drink alcohol and does not use drugs.  Family History  Problem Relation Age of Onset   Heart disease Mother    Dementia Mother  Heart disease Father        CAD?   Coronary artery disease Brother 50   Breast cancer Neg Hx    Colon cancer Neg Hx    Diabetes Neg Hx    Stroke Neg Hx     Medications: Patient's Medications  New Prescriptions   MIRTAZAPINE  (REMERON ) 7.5 MG TABLET    Take 1 tablet (7.5 mg total) by mouth at bedtime.  Previous Medications   ACETAMINOPHEN  (TYLENOL ) 500 MG TABLET    Take 2 tablets (1,000 mg total) by mouth every 6 (six) hours.    ALPRAZOLAM  (XANAX ) 0.5 MG TABLET    Take 0.5 tablets (0.25 mg total) by mouth 2 (two) times daily as needed for anxiety.   BUPROPION  (WELLBUTRIN  SR) 150 MG 12 HR TABLET    Take 2 tablets (300 mg total) by mouth daily.   CALCIUM CARB-CHOLECALCIFEROL (CALCIUM 600+D3) 600-800 MG-UNIT TABS    Take 1 tablet by mouth 2 (two) times daily.   CHOLECALCIFEROL (VITAMIN D3) 50 MCG (2000 UT) TABS    Take 2,000 Units by mouth every morning.   DONEPEZIL  (ARICEPT ) 5 MG TABLET    TAKE 1 TABLET BY MOUTH AT  BEDTIME   ESCITALOPRAM  (LEXAPRO ) 20 MG TABLET    TAKE 1 TABLET BY MOUTH DAILY   LOSARTAN  (COZAAR ) 50 MG TABLET    TAKE 1 TABLET BY MOUTH DAILY   MEMANTINE  (NAMENDA ) 10 MG TABLET    TAKE 1 TABLET BY MOUTH TWICE  DAILY   MULTIPLE VITAMIN (MULTIVITAMIN WITH MINERALS) TABS TABLET    Take 1 tablet by mouth every morning.   PRAVASTATIN  (PRAVACHOL ) 40 MG TABLET    TAKE 1 TABLET BY MOUTH AT  BEDTIME   PRIMIDONE  (MYSOLINE ) 50 MG TABLET    TAKE 1 TABLET BY MOUTH AT  BEDTIME  Modified Medications   Modified Medication Previous Medication   AZELASTINE  (ASTELIN ) 0.1 % NASAL SPRAY azelastine  (ASTELIN ) 0.1 % nasal spray      Place 2 sprays into both nostrils at bedtime as needed for rhinitis. Use in each nostril as directed    Place 2 sprays into both nostrils at bedtime as needed for rhinitis. Use in each nostril as directed  Discontinued Medications   No medications on file    Physical Exam:  Vitals:   10/15/23 1430  BP: 124/78  Pulse: 82  Temp: (!) 97.2 F (36.2 C)  SpO2: 98%  Weight: 122 lb 9.6 oz (55.6 kg)  Height: 5' 4 (1.626 m)   Body mass index is 21.04 kg/m. Wt Readings from Last 3 Encounters:  10/15/23 122 lb 9.6 oz (55.6 kg)  06/14/23 129 lb (58.5 kg)  05/14/23 137 lb (62.1 kg)    Physical Exam Constitutional:      General: She is not in acute distress.    Appearance: She is well-developed. She is not diaphoretic.  HENT:     Head: Normocephalic and atraumatic.     Mouth/Throat:      Pharynx: No oropharyngeal exudate.  Eyes:     Conjunctiva/sclera: Conjunctivae normal.     Pupils: Pupils are equal, round, and reactive to light.  Cardiovascular:     Rate and Rhythm: Normal rate and regular rhythm.     Heart sounds: Normal heart sounds.  Pulmonary:     Effort: Pulmonary effort is normal.     Breath sounds: Normal breath sounds.  Abdominal:     General: Bowel sounds are normal.     Palpations: Abdomen is soft.  Musculoskeletal:     Cervical back: Normal range of motion and neck supple.     Right lower leg: No edema.     Left lower leg: No edema.  Skin:    General: Skin is warm and dry.  Neurological:     Mental Status: She is alert. Mental status is at baseline.  Psychiatric:        Mood and Affect: Mood normal.     Labs reviewed: Basic Metabolic Panel: Recent Labs    01/15/23 1625 05/14/23 1518 05/21/23 1138 06/14/23 1420  NA 140 142 139 142  K 4.2 2.7* 3.8 3.7  CL 104 100 102 104  CO2 29 33* 32 30  GLUCOSE 85 101* 97 117  BUN 20 11 10 9   CREATININE 1.00* 0.83 0.89 1.06*  CALCIUM 9.5 8.7 8.8 9.6  TSH 5.68* 3.05  --   --    Liver Function Tests: Recent Labs    01/15/23 1625 05/14/23 1518 05/21/23 1138  AST 16 23 15   ALT 13 13 9   BILITOT  --  0.5 0.4  PROT  --  5.9* 5.5*   No results for input(s): LIPASE, AMYLASE in the last 8760 hours. No results for input(s): AMMONIA in the last 8760 hours. CBC: Recent Labs    05/14/23 1518  WBC 4.9  NEUTROABS 2,426  HGB 13.3  HCT 39.7  MCV 93.6  PLT 269   Lipid Panel: Recent Labs    01/15/23 1625 05/14/23 1518  CHOL 199 168  HDL 53 52  LDLCALC 121* 92  TRIG 133 137  CHOLHDL 3.8 3.2   TSH: Recent Labs    01/15/23 1625 05/14/23 1518  TSH 5.68* 3.05   A1C: Lab Results  Component Value Date   HGBA1C 5.3 04/22/2021     Assessment/Plan  Vasomotor rhinitis Assessment & Plan: Ongoing, discussed symptom management. She has rx for azelastine  nasal spray, will  refil.  Orders: -     Azelastine  HCl; Place 2 sprays into both nostrils at bedtime as needed for rhinitis. 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 Continues with supportive care.    Weight loss Assessment & Plan: Ongoing due to poor appetite due to dementia Will add Remeron  to see if this will help increase appetite.    Anxiety and depression Assessment & Plan: Overall stable, will add remeron  to help with sleep and appetite and at follow up look at titration of lexapro     Insomnia, unspecified type Assessment & Plan: Worsening sleep issues per daughter Will add remeron  to help with sleep and appetite.   Orders: -     Mirtazapine ; Take 1 tablet (7.5 mg total) by mouth at bedtime.  Dispense: 30 tablet; Refill: 1  Essential tremor Assessment & Plan: Well controlled on primidone  50 mg at bedtime.       Return in about 3 months (around 01/15/2024) for routine follow up, weight, mood.  Uriyah Massimo K. Caro BODILY [facility] [phone]

## 2023-10-18 DIAGNOSIS — G4700 Insomnia, unspecified: Secondary | ICD-10-CM | POA: Insufficient documentation

## 2023-10-18 DIAGNOSIS — R634 Abnormal weight loss: Secondary | ICD-10-CM | POA: Insufficient documentation

## 2023-10-18 DIAGNOSIS — J300 Vasomotor rhinitis: Secondary | ICD-10-CM | POA: Insufficient documentation

## 2023-10-18 NOTE — Assessment & Plan Note (Signed)
 Well controlled on primidone  50 mg at bedtime.

## 2023-10-18 NOTE — Assessment & Plan Note (Signed)
 Some increase in behaviors in the evening but overall doing well. Continues with supportive care.

## 2023-10-18 NOTE — Assessment & Plan Note (Signed)
 Ongoing, discussed symptom management. She has rx for azelastine  nasal spray, will refil.

## 2023-10-18 NOTE — Assessment & Plan Note (Signed)
 Ongoing due to poor appetite due to dementia Will add Remeron  to see if this will help increase appetite.

## 2023-10-18 NOTE — Assessment & Plan Note (Signed)
 Overall stable, will add remeron  to help with sleep and appetite and at follow up look at titration of lexapro 

## 2023-10-18 NOTE — Assessment & Plan Note (Signed)
 Worsening sleep issues per daughter Will add remeron  to help with sleep and appetite.

## 2023-11-04 ENCOUNTER — Other Ambulatory Visit: Payer: Self-pay | Admitting: Nurse Practitioner

## 2023-11-04 DIAGNOSIS — G4700 Insomnia, unspecified: Secondary | ICD-10-CM

## 2023-11-12 ENCOUNTER — Other Ambulatory Visit: Payer: Self-pay | Admitting: Internal Medicine

## 2023-12-14 ENCOUNTER — Other Ambulatory Visit: Payer: Self-pay | Admitting: Internal Medicine

## 2023-12-21 ENCOUNTER — Other Ambulatory Visit: Payer: Self-pay | Admitting: Family

## 2024-01-09 ENCOUNTER — Other Ambulatory Visit: Payer: Self-pay | Admitting: Internal Medicine

## 2024-01-14 ENCOUNTER — Encounter: Payer: Self-pay | Admitting: Nurse Practitioner

## 2024-01-17 ENCOUNTER — Encounter: Payer: Self-pay | Admitting: Nurse Practitioner

## 2024-01-17 ENCOUNTER — Telehealth (INDEPENDENT_AMBULATORY_CARE_PROVIDER_SITE_OTHER): Payer: Self-pay | Admitting: Nurse Practitioner

## 2024-01-17 DIAGNOSIS — F02B3 Dementia in other diseases classified elsewhere, moderate, with mood disturbance: Secondary | ICD-10-CM

## 2024-01-17 DIAGNOSIS — I10 Essential (primary) hypertension: Secondary | ICD-10-CM

## 2024-01-17 DIAGNOSIS — F331 Major depressive disorder, recurrent, moderate: Secondary | ICD-10-CM

## 2024-01-17 DIAGNOSIS — E782 Mixed hyperlipidemia: Secondary | ICD-10-CM

## 2024-01-17 DIAGNOSIS — G301 Alzheimer's disease with late onset: Secondary | ICD-10-CM | POA: Diagnosis not present

## 2024-01-17 DIAGNOSIS — G4700 Insomnia, unspecified: Secondary | ICD-10-CM

## 2024-01-17 DIAGNOSIS — G250 Essential tremor: Secondary | ICD-10-CM

## 2024-01-17 DIAGNOSIS — R634 Abnormal weight loss: Secondary | ICD-10-CM | POA: Diagnosis not present

## 2024-01-17 DIAGNOSIS — F419 Anxiety disorder, unspecified: Secondary | ICD-10-CM

## 2024-01-17 NOTE — Progress Notes (Signed)
 Careteam: Patient Care Team: Caro Harlene POUR, NP as PCP - General (Geriatric Medicine) Heide Ingle, MD as Consulting Physician (Orthopedic Surgery) Rosalie Kitchens, MD as Consulting Physician (Gastroenterology)  Advanced Directive information    Allergies  Allergen Reactions   Codeine Nausea And Vomiting   Sulfa Antibiotics Other (See Comments)    Was told allergic to sulfa does not remember why   Penicillins Rash    Chief Complaint  Patient presents with   Medical Management of Chronic Issues     3 months for routine follow up, weight, mood     HPI: Patient is a 88 y.o. female for follow up virtual visit Discussed the use of AI scribe software for clinical note transcription with the patient, who gave verbal consent to proceed.  History of Present Illness Andrea Martin is an 88 year old female with dementia who presents for a three-month follow-up visit.  She has been experiencing weight loss and a lack of appetite. Mirtazapine  7.5 mg at bedtime was initiated, which has improved her sleep but not significantly increased her appetite. She is currently maintaining her weight, although she has probably lost some weight since the last visit per daughter who provides history during visit.   Her history of anxiety and depression is well-managed with escitalopram  20 mg daily, mirtazapine  7.5 mg at bedtime, and bupropion  300 mg daily. She no longer uses alprazolam  and has not experienced recent episodes of depression or anxiety.  She is on medications for Alzheimer's dementia, including memantine  10 mg twice daily and donepezil . She is able to feed herself.  She has a history of high blood pressure, managed with losartan  50 mg daily,   and high cholesterol, for which she takes pravastatin   She experiences weakness and prefers to stay in bed, making it difficult for her caregiver to bring her to appointments due to concerns about her ability to navigate stairs safely. She  uses a walker for mobility.  Her daughter has expressed interest in hospice care due to her dementia and weight loss, and she has had previous positive experiences with hospice services.     Review of Systems:  Review of Systems  Unable to perform ROS: Dementia    Past Medical History:  Diagnosis Date   Anxiety and depression    Gait disorder    History of fractures due to falls 09/01/2020   History of fractures due to falls 09/01/2020   HTN (hypertension) 09/01/2020   Hyperlipidemia    Neuropathy 08/04/2013   Shoulder dislocation    h/o after a fall   Past Surgical History:  Procedure Laterality Date   NO PAST SURGERIES     Social History:   reports that she has never smoked. She has never used smokeless tobacco. She reports that she does not drink alcohol and does not use drugs.  Family History  Problem Relation Age of Onset   Heart disease Mother    Dementia Mother    Heart disease Father        CAD?   Coronary artery disease Brother 71   Breast cancer Neg Hx    Colon cancer Neg Hx    Diabetes Neg Hx    Stroke Neg Hx     Medications: Patient's Medications  New Prescriptions   No medications on file  Previous Medications   ACETAMINOPHEN  (TYLENOL ) 500 MG TABLET    Take 2 tablets (1,000 mg total) by mouth every 6 (six) hours.   ALPRAZOLAM  (XANAX ) 0.5 MG TABLET  Take 0.5 tablets (0.25 mg total) by mouth 2 (two) times daily as needed for anxiety.   AZELASTINE  (ASTELIN ) 0.1 % NASAL SPRAY    Place 2 sprays into both nostrils at bedtime as needed for rhinitis. Use in each nostril as directed   BUPROPION  (WELLBUTRIN  SR) 150 MG 12 HR TABLET    Take 2 tablets (300 mg total) by mouth daily.   CALCIUM CARB-CHOLECALCIFEROL (CALCIUM 600+D3) 600-800 MG-UNIT TABS    Take 1 tablet by mouth 2 (two) times daily.   CHOLECALCIFEROL (VITAMIN D3) 50 MCG (2000 UT) TABS    Take 2,000 Units by mouth every morning.   DONEPEZIL  (ARICEPT ) 5 MG TABLET    TAKE 1 TABLET BY MOUTH AT   BEDTIME   ESCITALOPRAM  (LEXAPRO ) 20 MG TABLET    TAKE 1 TABLET BY MOUTH DAILY   LOSARTAN  (COZAAR ) 50 MG TABLET    TAKE 1 TABLET BY MOUTH DAILY   MEMANTINE  (NAMENDA ) 10 MG TABLET    TAKE 1 TABLET BY MOUTH TWICE  DAILY   MIRTAZAPINE  (REMERON ) 7.5 MG TABLET    TAKE 1 TABLET BY MOUTH AT  BEDTIME   MULTIPLE VITAMIN (MULTIVITAMIN WITH MINERALS) TABS TABLET    Take 1 tablet by mouth every morning.   PRAVASTATIN  (PRAVACHOL ) 40 MG TABLET    TAKE 1 TABLET BY MOUTH AT  BEDTIME   PRIMIDONE  (MYSOLINE ) 50 MG TABLET    TAKE 1 TABLET BY MOUTH AT  BEDTIME  Modified Medications   No medications on file  Discontinued Medications   No medications on file    Physical Exam:  There were no vitals filed for this visit. There is no height or weight on file to calculate BMI. Wt Readings from Last 3 Encounters:  10/15/23 122 lb 9.6 oz (55.6 kg)  06/14/23 129 lb (58.5 kg)  05/14/23 137 lb (62.1 kg)    Physical Exam Constitutional:      Appearance: Normal appearance.  Pulmonary:     Effort: Pulmonary effort is normal.  Neurological:     Mental Status: She is alert. Mental status is at baseline.  Psychiatric:        Mood and Affect: Mood normal.     Labs reviewed: Basic Metabolic Panel: Recent Labs    05/14/23 1518 05/21/23 1138 06/14/23 1420  NA 142 139 142  K 2.7* 3.8 3.7  CL 100 102 104  CO2 33* 32 30  GLUCOSE 101* 97 117  BUN 11 10 9   CREATININE 0.83 0.89 1.06*  CALCIUM 8.7 8.8 9.6  TSH 3.05  --   --    Liver Function Tests: Recent Labs    05/14/23 1518 05/21/23 1138  AST 23 15  ALT 13 9  BILITOT 0.5 0.4  PROT 5.9* 5.5*   No results for input(s): LIPASE, AMYLASE in the last 8760 hours. No results for input(s): AMMONIA in the last 8760 hours. CBC: Recent Labs    05/14/23 1518  WBC 4.9  NEUTROABS 2,426  HGB 13.3  HCT 39.7  MCV 93.6  PLT 269   Lipid Panel: Recent Labs    05/14/23 1518  CHOL 168  HDL 52  LDLCALC 92  TRIG 137  CHOLHDL 3.2   TSH: Recent  Labs    05/14/23 1518  TSH 3.05   A1C: Lab Results  Component Value Date   HGBA1C 5.3 04/22/2021     Assessment/Plan Assessment and Plan Assessment & Plan Alzheimer's disease with late onset Progressive dementia with current functionality maintained. She can  feed herself and is not experiencing significant anxiety or depression related to dementia. - Continue Namenda  10 mg twice daily. - Continue Aricept  as long as functionality is maintained. - Consulted hospice for additional resources and support.  Abnormal weight loss in the setting of dementia Weight loss has stabilized. Appetite remains low, but she is able to consume some food daily. Current medication regimen includes mirtazapine , which aids in sleep but has not significantly increased appetite. - Continue mirtazapine  7.5 mg at bedtime.  Insomnia Doing well on remeron   Depression  Depression and anxiety are well-controlled with current medication regimen. No recent episodes of depression or anxiety reported. - Continue Lexapro  20 mg daily. - Continue Wellbutrin  300 mg daily. - Continue mirtazapine  7.5 mg at bedtime.  Anxiety Well controlled, continue current regimen, has stopped alprazolam    Essential tremor Tremors are well-controlled with current medication regimen. - Continue primidone  for tremor management.  Essential hypertension Blood pressure management is ongoing with losartan . No recent home blood pressure monitoring reported. - Continue losartan  50 mg daily.  Mixed hyperlipidemia  Statin therapy is being discontinued due to dementia progression and end-of-life planning. Focus is on comfort care rather than long-term cholesterol management. - Discontinued pravastatin .    Next appt: follow up in 6 months- can be virtual  Andrea Martin K. Caro BODILY  [facility] [phone]    Virtual Visit via video  I connected with patient on 01/17/24 at  1:20 PM EST by mychart and  verified that I am speaking with the correct person using two identifiers.  Location: Patient: HOME Provider: clinic   I discussed the limitations, risks, security and privacy concerns of performing an evaluation and management service by telephone and the availability of in person appointments. I also discussed with the patient that there may be a patient responsible charge related to this service. The patient expressed understanding and agreed to proceed.   I discussed the assessment and treatment plan with the patient. The patient was provided an opportunity to ask questions and all were answered. The patient agreed with the plan and demonstrated an understanding of the instructions.   The patient was advised to call back or seek an in-person evaluation if the symptoms worsen or if the condition fails to improve as anticipated.  I provided 15 minutes of non-face-to-face time during this encounter.  Elbony Mcclimans K. Caro BODILY Avs printed and mailed

## 2024-01-17 NOTE — Progress Notes (Signed)
 This service is provided via telemedicine  No vital signs collected/recorded due to the encounter was a telemedicine visit.   Location of patient (ex: home, work):  Home   Patient consents to a telephone visit:  Yes, 01/17/24    Location of the provider (ex: office, home):  [facility]  Name of any referring provider:  Caro Harlene POUR, NP   Names of all persons participating in the telemedicine service and their role in the encounter: Eshika Reckart B/CMA, Caro Harlene POUR, NP , and patient  Time spent on call:  11 minutes

## 2024-01-17 NOTE — Progress Notes (Signed)
 I connected with  Andrea Martin on 01/17/24 by a video enabled telemedicine application and verified that I am speaking with the correct person using two identifiers.   I discussed the limitations of evaluation and management by telemedicine. The patient expressed understanding and agreed to proceed.

## 2024-02-07 ENCOUNTER — Encounter: Payer: Self-pay | Admitting: Nurse Practitioner

## 2024-02-08 NOTE — Telephone Encounter (Signed)
 Sending to medical assistant in clinical intake

## 2024-02-08 NOTE — Telephone Encounter (Signed)
 Please assist with this.

## 2024-02-24 DEATH — deceased

## 2024-03-11 ENCOUNTER — Other Ambulatory Visit: Payer: Self-pay | Admitting: Nurse Practitioner

## 2024-03-13 NOTE — Telephone Encounter (Signed)
 High risk warning

## 2024-03-18 ENCOUNTER — Other Ambulatory Visit: Payer: Self-pay | Admitting: Internal Medicine

## 2024-07-21 ENCOUNTER — Ambulatory Visit: Admitting: Nurse Practitioner
